# Patient Record
Sex: Male | Born: 1940
Health system: Southern US, Community
[De-identification: ages and names within clinical notes are randomized; demographics above are authoritative.]

## PROBLEM LIST (undated history)

## (undated) DIAGNOSIS — K922 Gastrointestinal hemorrhage, unspecified: Secondary | ICD-10-CM

## (undated) DIAGNOSIS — I509 Heart failure, unspecified: Secondary | ICD-10-CM

## (undated) DIAGNOSIS — E119 Type 2 diabetes mellitus without complications: Secondary | ICD-10-CM

## (undated) DIAGNOSIS — I2510 Atherosclerotic heart disease of native coronary artery without angina pectoris: Secondary | ICD-10-CM

## (undated) DIAGNOSIS — Z8719 Personal history of other diseases of the digestive system: Secondary | ICD-10-CM

## (undated) DIAGNOSIS — I219 Acute myocardial infarction, unspecified: Secondary | ICD-10-CM

## (undated) DIAGNOSIS — K08109 Complete loss of teeth, unspecified cause, unspecified class: Secondary | ICD-10-CM

## (undated) DIAGNOSIS — D649 Anemia, unspecified: Secondary | ICD-10-CM

## (undated) DIAGNOSIS — I10 Essential (primary) hypertension: Secondary | ICD-10-CM

## (undated) DIAGNOSIS — Z972 Presence of dental prosthetic device (complete) (partial): Secondary | ICD-10-CM

## (undated) HISTORY — PX: RECTAL SURGERY: SHX760

## (undated) HISTORY — PX: HEMORROIDECTOMY: SUR656

## (undated) HISTORY — PX: EYE SURGERY: SHX253

## (undated) HISTORY — PX: FOOT SURGERY: SHX648

## (undated) HISTORY — PX: OTHER SURGICAL HISTORY: SHX169

## (undated) HISTORY — PX: SHOULDER SURGERY: SHX246

## (undated) HISTORY — PX: CORONARY ANGIOPLASTY: SHX604

---
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
## 2002-04-05 DIAGNOSIS — I219 Acute myocardial infarction, unspecified: Secondary | ICD-10-CM

## 2002-04-05 HISTORY — DX: Acute myocardial infarction, unspecified: I21.9

## 2008-08-05 ENCOUNTER — Ambulatory Visit: Payer: Self-pay | Admitting: Gastroenterology

## 2008-08-05 LAB — HM COLONOSCOPY

## 2010-05-02 HISTORY — PX: CHOLECYSTECTOMY: SHX55

## 2011-04-22 ENCOUNTER — Emergency Department: Payer: Self-pay | Admitting: Emergency Medicine

## 2011-06-13 ENCOUNTER — Ambulatory Visit: Payer: Self-pay | Admitting: Surgery

## 2011-06-13 LAB — BASIC METABOLIC PANEL
BUN: 19 mg/dL — ABNORMAL HIGH (ref 7–18)
Calcium, Total: 9.5 mg/dL (ref 8.5–10.1)
Chloride: 101 mmol/L (ref 98–107)
EGFR (African American): >60
EGFR (Non-African Amer.): >60
Glucose: 106 mg/dL — ABNORMAL HIGH (ref 65–99)
Osmolality: 280 (ref 275–301)
Potassium: 3.6 mmol/L (ref 3.5–5.1)
Sodium: 139 mmol/L (ref 136–145)

## 2011-06-16 ENCOUNTER — Ambulatory Visit: Payer: Self-pay | Admitting: Surgery

## 2013-01-28 ENCOUNTER — Ambulatory Visit: Payer: Self-pay | Admitting: Gastroenterology

## 2013-03-12 ENCOUNTER — Ambulatory Visit: Payer: Self-pay | Admitting: Surgery

## 2013-03-12 LAB — CBC WITH DIFFERENTIAL/PLATELET
Basophil #: 0 10*3/uL (ref 0.0–0.1)
Eosinophil %: 5.6 %
HCT: 35.5 % — ABNORMAL LOW (ref 40.0–52.0)
Lymphocyte #: 1.1 10*3/uL (ref 1.0–3.6)
MCH: 23.6 pg — ABNORMAL LOW (ref 26.0–34.0)
MCV: 74 fL — ABNORMAL LOW (ref 80–100)
Monocyte #: 0.5 x10 3/mm (ref 0.2–1.0)
Neutrophil #: 3.5 10*3/uL (ref 1.4–6.5)
Neutrophil %: 63.9 %
Platelet: 234 10*3/uL (ref 150–440)
RBC: 4.8 10*6/uL (ref 4.40–5.90)
RDW: 19 % — ABNORMAL HIGH (ref 11.5–14.5)

## 2013-03-12 LAB — BASIC METABOLIC PANEL
Anion Gap: 2 — ABNORMAL LOW (ref 7–16)
Calcium, Total: 9.5 mg/dL (ref 8.5–10.1)
Chloride: 104 mmol/L (ref 98–107)
Co2: 30 mmol/L (ref 21–32)
Creatinine: 1.06 mg/dL (ref 0.60–1.30)
EGFR (Non-African Amer.): >60
Glucose: 171 mg/dL — ABNORMAL HIGH (ref 65–99)
Sodium: 136 mmol/L (ref 136–145)

## 2013-03-19 ENCOUNTER — Ambulatory Visit: Payer: Self-pay | Admitting: Surgery

## 2013-03-20 LAB — PATHOLOGY REPORT

## 2013-03-27 ENCOUNTER — Observation Stay: Payer: Self-pay | Admitting: Surgery

## 2013-03-27 LAB — COMPREHENSIVE METABOLIC PANEL
Albumin: 3.7 g/dL (ref 3.4–5.0)
Alkaline Phosphatase: 95 U/L
Anion Gap: 9 (ref 7–16)
BUN: 17 mg/dL (ref 7–18)
Chloride: 100 mmol/L (ref 98–107)
EGFR (African American): >60
EGFR (Non-African Amer.): 57 — ABNORMAL LOW
Glucose: 186 mg/dL — ABNORMAL HIGH (ref 65–99)
Osmolality: 275 (ref 275–301)
Potassium: 3.5 mmol/L (ref 3.5–5.1)
SGOT(AST): 14 U/L — ABNORMAL LOW (ref 15–37)
SGPT (ALT): 30 U/L (ref 12–78)
Total Protein: 7.5 g/dL (ref 6.4–8.2)

## 2013-03-27 LAB — CBC
HGB: 11.3 g/dL — ABNORMAL LOW (ref 13.0–18.0)
MCV: 74 fL — ABNORMAL LOW (ref 80–100)
Platelet: 296 10*3/uL (ref 150–440)
RDW: 18.7 % — ABNORMAL HIGH (ref 11.5–14.5)
WBC: 8.6 10*3/uL (ref 3.8–10.6)

## 2013-05-02 HISTORY — PX: SHOULDER SURGERY: SHX246

## 2013-05-06 ENCOUNTER — Ambulatory Visit: Payer: Self-pay | Admitting: Specialist

## 2013-05-16 ENCOUNTER — Ambulatory Visit: Payer: Self-pay | Admitting: Specialist

## 2013-05-16 LAB — POTASSIUM: Potassium: 3.6 mmol/L (ref 3.5–5.1)

## 2013-05-24 ENCOUNTER — Ambulatory Visit: Payer: Self-pay | Admitting: Specialist

## 2013-11-14 LAB — PSA: PSA: 2.7

## 2014-05-02 HISTORY — PX: BACK SURGERY: SHX140

## 2014-06-12 LAB — BASIC METABOLIC PANEL
BUN: 16 mg/dL (ref 4–21)
Creatinine: 1 mg/dL (ref 0.6–1.3)
Glucose: 114 mg/dL
POTASSIUM: 4.3 mmol/L (ref 3.4–5.3)
Sodium: 140 mmol/L (ref 137–147)

## 2014-06-12 LAB — LIPID PANEL
Cholesterol: 171 mg/dL (ref 0–200)
HDL: 57 mg/dL (ref 35–70)
LDL Cholesterol: 70 mg/dL
LDL/HDL RATIO: 1.2
TRIGLYCERIDES: 220 mg/dL — AB (ref 40–160)

## 2014-06-12 LAB — HEPATIC FUNCTION PANEL
ALT: 18 U/L (ref 10–40)
AST: 21 U/L (ref 14–40)
Alkaline Phosphatase: 55 U/L (ref 25–125)
Bilirubin, Total: 0.6 mg/dL

## 2014-06-12 LAB — TSH: TSH: 1.29 u[IU]/mL (ref 0.41–5.90)

## 2014-08-22 NOTE — H&P (Signed)
Subjective/Chief Complaint Significant rectal bleeding   History of Present Illness 74 y/o male now POD 8 s/p PPH stapled hemorroidectomy for symptomatic grade 3 hemorroids.  Uneventful post op course until at 630 pm tonight when had sudden onset of bright red blood per rectum during BM.  He experienced a small amount of pain in rectum which has since resolved.  He has continued to bleed since arrival in ER.  Surgery was asked to evaluate.  takes plavix and ASA.   Past History CAD h/o MI with stents. Hypertension Type 2 DM hemorroids diverticulosis   Primary Physician [facility].   Past Med/Surgical Hx:  HTN:   Diabetes:   MI - Myocardial Infarct: x3  Hemorrhoidectomy:   Ankle Surgery - Right:   Carotid Stent Placement:   ALLERGIES:  PCN: Hives  HOME MEDICATIONS: Medication Instructions Status  aspirin 81 mg oral tablet 1 tab(s) orally once a day HS Active  hydrochlorothiazide-lisinopril 12.5 mg-20 mg oral tablet 1 tab(s) orally once a day AM Active  Lasix 40 mg oral tablet 1 tab(s) orally once a day AM Active  multi-vitamin 1 tablet dailyAM Active  Fish Oil 1000 mg oral capsule takes 1 cap two x daily Active  Actos 45 mg oral tablet 1 tab(s) orally once a  day AM Active  mag oxide   takes 443m 2 x daily Active  Norvasc 5 mg oral tablet 1 tab(s) orally once a day Active  traMADol 50 mg oral tablet 1 tab(s) orally every 4 hours, As Needed Active  Vitamin D3 1000 intl units oral tablet 1 tab(s) orally once a day Active  meclizine 25 mg oral tablet 1 tab(s) orally 3 times a day, As Needed Active  pantoprazole 40 mg oral delayed release tablet 1 tab(s) orally once a day Active  ferrous sulfate  orally 3240mActive  acetaminophen-HYDROcodone 325 mg-5 mg oral tablet  orally 1-2  every 4 hours as needed for pain Active  docusate sodium 200 milligram(s) orally 2 times a day Active  metformin 1000 mg oral tablet 1 tab(s) orally 2 times a day Active  lovastatin 40 mg oral  tablet 1 tab(s) orally at bedtime Active  metoprolol tartrate 50 mg oral tablet 1 tab(s) orally 2 times a day Active  glucosamine 15002mO   1 tablet twice a day Active   Review of Systems:  Subjective/Chief Complaint see above   Fever/Chills No   Cough No   Abdominal Pain No   Tolerating Diet Yes   Medications/Allergies Reviewed Medications/Allergies reviewed   Physical Exam:  GEN no acute distress, obese   HEENT pale conjunctivae, PERRL   NECK supple   RESP normal resp effort  clear BS   CARD regular rate   ABD denies tenderness  large amount of clotted blood on perineum, DRE deferred.   LYMPH negative neck   EXTR negative cyanosis/clubbing   SKIN normal to palpation   NEURO cranial nerves intact   PSYCH A+O to time, place, person, good insight   Lab Results: Hepatic:  26-Nov-14 19:46   Bilirubin, Total 0.3  Alkaline Phosphatase 95 (45-117 NOTE: New Reference Range 03/22/13)  SGPT (ALT) 30  SGOT (AST)  14  Total Protein, Serum 7.5  Albumin, Serum 3.7  Routine BB:  26-Nov-14 19:46   ABO Group + Rh Type A Positive  Antibody Screen NEGATIVE (Result(s) reported on 27 Mar 2013 at 08:55PM.)  Routine Chem:  26-Nov-14 19:46   Glucose, Serum  186  BUN 17  Creatinine (comp) 1.26  Sodium, Serum  134  Potassium, Serum 3.5  Chloride, Serum 100  CO2, Serum 25  Calcium (Total), Serum 9.4  Osmolality (calc) 275  eGFR (African American) >60  eGFR (Non-African American)  57 (eGFR values <65m/min/1.73 m2 may be an indication of chronic kidney disease (CKD). Calculated eGFR is useful in patients with stable renal function. The eGFR calculation will not be reliable in acutely ill patients when serum creatinine is changing rapidly. It is not useful in  patients on dialysis. The eGFR calculation may not be applicable to patients at the low and high extremes of body sizes, pregnant women, and vegetarians.)  Anion Gap 9  Routine Hem:  26-Nov-14 19:46   WBC  (CBC) 8.6  RBC (CBC) 4.72  Hemoglobin (CBC)  11.3  Hematocrit (CBC)  34.9  Platelet Count (CBC) 296 (Result(s) reported on 27 Mar 2013 at 08:06PM.)  MCV  74  MCH  23.9  MCHC 32.3  RDW  18.7    Assessment/Admission Diagnosis 74y/o male with post op bleeding following PPH hemorroidectomy.   Plan Admit,  NPO exam under anesthesia. discussed in detail with him and wife.   Electronic Signatures: BSherri Rad(MD)  (Signed [id number])  Authored: CHIEF COMPLAINT and HISTORY, PAST MEDICAL/SURGIAL HISTORY, ALLERGIES, HOME MEDICATIONS, REVIEW OF SYSTEMS, PHYSICAL EXAM, LABS, ASSESSMENT AND PLAN   Last Updated: 26-Nov-14 21:45 by BSherri Rad(MD)

## 2014-08-22 NOTE — Op Note (Signed)
PATIENT NAME:  Todd Olson, Todd Olson MR#:  [id number] DATE OF BIRTH:  01/21/1941  DATE OF PROCEDURE:  03/19/2013  PREOPERATIVE DIAGNOSIS: Hemorrhoids.   POSTOPERATIVE DIAGNOSIS: Hemorrhoids.     OPERATION: Hemorrhoidectomy.   SURGEON: Micheline Maze, MD    ASSISTANT:  Imogene Burn, PA student.   ANESTHESIA: General.   OPERATIVE PROCEDURE: With the patient in the supine position after appropriate general anesthesia he was placed in lithotomy position, appropriately padded and positioned. He did have significant prolapsing hemorrhoids, graded at a stage 3. A bivalve retractor was used to evaluate the rectum. There was 1 large cluster in the 8 o'clock position and multiple smaller clusters. I made the decision to pursue a [place] stapled hemorrhoidectomy as I was concerned about the possibility of leaving significant hemorrhoids behind with a conventional procedure. Using the obturator the rectum was evaluated.  A pursestring suture was placed 2 cm above the highest hemorrhoid in a circular fashion. A 2-0 Prolene suture was utilized for that pursestring. The pursestring appeared to be satisfactory. The stapling device was brought to the table, greased and inserted into the staple line. Staples line was secured around the anvil in a circumferential fashion. The suture was then tied and the strands brought through the body of the stapler to hold pressure on the closure. The stapler was approximated and after appropriate interval 2 minutes, fired. The patient did have a little bucking at that time and I am concerned it may have contributed to the eventual result.  The stapler was fired and opened. A complete ring was not identified. The largest hemorrhoid cluster had not been incorporated and I believe was pushed out of the stapling device during the patient's bucking against the ventilator. I elected to remove that hemorrhoid cluster individually. The LigaSure apparatus was brought to the table and the hemorrhoid  removed in the standard fashion. It was oversewn with a 2-0 Vicryl, leaving a drain site at the anoderm. Bleeding appeared to be satisfactorily controlled. The area was irrigated. A plug of Avitene and Gelfoam was inserted into the rectum and a sterile dressing applied. The patient was returned to the recovery room, having tolerated the procedure well.  Sponge and needle count were correct x 2 in the Operating Room.    ____________________________ Micheline Maze, MD rle:cs D: 03/19/2013 13:45:51 ET T: 03/19/2013 14:03:26 ET JOB#: [id number]  cc: Rodena Goldmann III, MD, <Dictator> Richard L. Rosanna Randy, MD Rodena Goldmann MD ELECTRONICALLY SIGNED 03/21/2013 19:04

## 2014-08-22 NOTE — Op Note (Signed)
PATIENT NAME:  Todd Olson, Todd Olson MR#:  [id number] DATE OF BIRTH:  Feb 07, 1941  DATE OF PROCEDURE:  03/28/2013  PREOPERATIVE DIAGNOSIS: Postoperative hemorrhage status post stapled hemorrhoidectomy.   POSTOPERATIVE DIAGNOSIS: Postoperative hemorrhage status post stapled hemorrhoidectomy.  PROCEDURES PERFORMED: Examination under anesthesia, cauterization of bleeding operative site, placement of rectal packing.   SURGEON: Sherri Rad, M.D. FACS  ASSISTANT: Scrub tech  ANESTHESIA: General endotracheal and 30 mL of 0.25% Marcaine with epinephrine.   DESCRIPTION OF PROCEDURE: With informed consent, supine position, general endotracheal anesthesia, the patient was then positioned in dorsal lithotomy with padding. The perineum was sterilely prepped and draped with Betadine solution. Timeout was observed.   Operative findings, under anesthesia, demonstrated an area of recent external hemorroidectomy site at the 8:00 position with the patient in dorsal lithotomy. There was evidence of active granulation tissue formation. Digital rectal examination demonstrated an intact PPH staple line in a circular fashion. The bivalved  small retractor was then placed. A small amount of clot was extruded. The anal canal was irrigated. Along the 8:00 position was some suture material with adherent clot. This was not disrupted. Distal to this, was a small area of visible bleeding from the granulation tissue site which was cauterized. No active bleeding was encountered. Anal canal was packed with Avitene and Gelfoam. A postoperative field block consisting of 30 mL of 0.25% Marcaine with epinephrine was infiltrated around the perianal skin. The patient was then subsequently extubated and taken to the recovery room in stable and satisfactory condition by anesthesia services.    ____________________________ Todd How Marina Gravel, MD FACS mab:NTS D: 03/28/2013 02:46:39 ET T: 03/28/2013 03:22:19 ET JOB#: [id number]  cc: Todd [place] A. Marina Gravel, MD,  <Dictator> Todd Fiorillo A Sophi Calligan MD ELECTRONICALLY SIGNED 03/28/2013 20:21

## 2014-08-23 NOTE — Op Note (Signed)
PATIENT NAME:  Todd Olson, Todd Olson MR#:  [id number] DATE OF BIRTH:  1941-04-06  DATE OF PROCEDURE:  05/24/2013  PREOPERATIVE DIAGNOSIS: Severe impingement syndrome, left shoulder.   POSTOPERATIVE DIAGNOSIS: Severe impingement syndrome, left shoulder.   PROCEDURES: 1.  Arthroscopic partial anterior acromioplasty, left shoulder.  2.  Arthroscopic partial distal claviculectomy left shoulder.   SURGEON: Christophe Louis, M.D.   ANESTHESIA: General.   COMPLICATIONS: None.   ESTIMATED BLOOD LOSS: 50 mL.   DESCRIPTION OF PROCEDURE:  Ancef 1 gram was given intravenously prior to administration of general anesthesia. The patient is turned over into the right lateral decubitus position and secured in the bean bag for left shoulder surgery. The left shoulder and arm were thoroughly prepped with alcohol and ChloraPrep and draped in standard sterile fashion. The arm is suspended with 12 pounds longitudinal traction and about 20 degrees of abduction and 20 degrees of forward flexion. The subacromial bursa is entered from a posterior portal. The outflow is anterior. There is seen to be in the bursa, a moderate amount of hypertrophic inflammation tissue present. From a lateral portal using the full radial resector and the ArthroWand, the hypertrophic tissue is fully excised along with the soft tissue on the inferior surface of the acromion. There is seen to be an extremely large anterior hypertrophic osteophyte coming off of the acromion and also a smaller osteophyte coming off of the distal inferior edge of the clavicle. Using a combination of the acromionizer, and the full radial resector, anterior acromionectomy was performed back to an anatomical position. Similarly, the distal clavicle was excised on the inferior portion where the osteophyte lay. The rotator cuff is thoroughly inspected and was seen to be intact with a moderate amount of fraying. The wound is thoroughly irrigated multiple times as well as the  bursa. Portals are closed with 5-0 nylon. The joint is infiltrated with 20 mL of Marcaine with epinephrine with 4 mg of morphine and 1 mL of Depo-Medrol. A soft bulky dressing is applied along with a sling, and the patient is returned to the recovery room in satisfactory condition having tolerated the procedure quite well.   ____________________________ Lucas Mallow, MD ces:dp D: 05/24/2013 11:21:47 ET T: 05/24/2013 11:31:05 ET JOB#: [id number]  cc: Lucas Mallow, MD, <Dictator> Lucas Mallow MD ELECTRONICALLY SIGNED 05/29/2013 8:42

## 2014-08-23 NOTE — Op Note (Signed)
PATIENT NAME:  Todd Olson, Todd Olson MR#:  [id number] DATE OF BIRTH:  Mar 14, 1941  DATE OF PROCEDURE:  05/24/2013  PREOPERATIVE DIAGNOSIS:  Severe impingement syndrome, left shoulder.   POSTOPERATIVE DIAGNOSIS:  Severe impingement syndrome, left shoulder.   PROCEDURES: 1.  Arthroscopic anterior acromioplasty, left shoulder.  2.  Arthroscopic partial distal claviculectomy left shoulder.   SURGEON: Christophe Louis, M.D.   ANESTHESIA: General.   COMPLICATIONS: None.   ESTIMATED BLOOD LOSS: 25 mL.   DESCRIPTION OF PROCEDURE:  Ancef 1 gram was given intravenously prior to the procedure. General anesthesia is induced. The patient is then turned over into the right lateral decubitus position and secured with the bean bag. The left upper extremity is then thoroughly prepped with alcohol and ChloraPrep and draped in standard sterile fashion. The arm is suspended with traction in slight flexion and about 30 degrees abduction with 12 pounds longitudinal traction. The subacromial bursa at the left shoulder is then entered and then the posterior portal is used for the camera and the anterior portal for outflow. Lateral portal was used for instrumentation. There is seen to be a moderate amount of hypertrophic bursal tissue present. The full radial resector and the ArthroWand are used to resect all the hypertrophic tissue and to remove the soft tissue from the posterior aspect of the anterior acromial spur. There is seen to be a very large anterior hypertrophic acromial spur along with a spur coming inferiorly off the end of the distal clavicle. Using the acromionizer, the anterior acromial spur is fully resected back to a normal anatomy. Probe is used to demonstrate that no residual spur was present. Similarly, the soft tissue is removed from the distal inferior portion of the clavicle and the acromionizer is used to remove the inferior spur. The joint is thoroughly irrigated multiple times. The portals are closed  with 5-0 nylon. The bursa is injected with 20 mL of Marcaine with epinephrine, 4 mg of morphine and 1 mL Depo-Medrol. A soft bulky dressing is applied along with a sling. The patient is returned to the recovery room in satisfactory condition having tolerated the procedure quite well.   ____________________________ Lucas Mallow, MD ces:dp D: 05/24/2013 09:46:48 ET T: 05/24/2013 10:23:35 ET JOB#: [id number]  cc: Lucas Mallow, MD, <Dictator> Lucas Mallow MD ELECTRONICALLY SIGNED 05/29/2013 8:42

## 2014-08-24 NOTE — Op Note (Signed)
PATIENT NAME:  Todd Olson, Todd Olson MR#:  [id number] DATE OF BIRTH:  October 06, 1940  DATE OF PROCEDURE:  06/16/2011  PREOPERATIVE DIAGNOSIS: Chronic cholecystitis, cholelithiasis.   POSTOPERATIVE DIAGNOSIS: Chronic cholecystitis, cholelithiasis.   PROCEDURE: Laparoscopic cholecystectomy, cholangiogram.   SURGEON: Rochel Brome, MD  ASSISTANT: Britta Mccreedy, PA   ANESTHESIA: General.   INDICATIONS: This 74 year old male has a history of epigastric pains and findings of gallstones and surgery was recommended for definitive treatment.   DESCRIPTION OF PROCEDURE: The patient was placed on the operating table in the supine position under general endotracheal anesthesia. The abdomen was prepared with ChloraPrep and draped in a sterile manner.   A short incision was made in the inferior aspect of the umbilicus and carried down to the deep fascia which was grasped with laryngeal hook and elevated. A Veress needle was inserted, aspirated, and irrigated with a saline solution. Next, the peritoneal cavity was inflated with carbon dioxide. The Veress needle was removed. The 10 mm cannula was inserted. The 10 mm, 0 degree laparoscope was inserted to view the peritoneal cavity. Another incision was made in the epigastrium just to the right of the midline to introduce an 11 mm cannula. Next, two incisions were made in the lateral aspect of the right upper quadrant to introduce two 5 mm cannulas.   The patient was placed in the reverse Trendelenburg position and turned 5 degrees to the left. The gallbladder was retracted towards the right shoulder. Multiple adhesions were taken down with blunt and sharp dissection. A number of adhesions were dissected sharply off the liver. Several small bleeding points were cauterized. Further dissection of adhesions was carried down to the infundibulum which was retracted inferiorly and laterally. The porta hepatis was demonstrated. There was a large amount of fatty tissue which was  dissected. The cystic artery was seen coursing along the anterior aspect of the gallbladder. It was dissected free from surrounding structures. The cystic duct was also dissected free from surrounding structures and appeared to be about 5 to 6 mm in dimension. Its junction with the porta hepatis was demonstrated. The gallbladder was further dissected free from the liver mobilizing the infundibulum and demonstrating a critical view of safety. The cystic artery was controlled with double endoclips and divided. The cystic duct was ligated with an endoclip adjacent to the infundibulum. Next, an incision was made in the cystic duct to introduce a Reddick catheter. Half-strength Conray-60 dye was injected as the cholangiogram was done with fluoroscopy demonstrating the biliary tree with typical cystic duct and prompt flow of dye into the duodenum. No retained stones were seen. The Reddick catheter was removed. The cystic duct was doubly ligated with endoclips and divided. The gallbladder was dissected free from the liver with hook and cautery. The site was irrigated with heparinized saline solution and aspirated. The gallbladder was delivered up through the infraumbilical incision, opened, and suctioned. Multiple stones were removed, and the gallbladder was removed along with stones and submitted in formalin for routine pathology. There were several particles of stones which fell onto the omentum and these were retrieved with a 5 mm grasper. The site was irrigated with heparinized saline solution and aspirated. The right upper quadrant was further inspected, irrigated, and aspirated. Hemostasis was intact.   All cannulas were removed. Carbon dioxide was allowed to escape from the peritoneal cavity. Skin incisions were closed with interrupted 5-0 chromic subcuticular sutures, benzoin, and Steri-Strips. Dressings were applied with paper tape. The patient tolerated surgery satisfactorily and is now  being prepared for  transfer to the Recovery Room.  ____________________________ Lenna Sciara. Rochel Brome, MD jws:slb D: 06/16/2011 10:23:20 ET T: 06/16/2011 10:33:11 ET JOB#: [id number]  cc: Loreli Dollar, MD, <Dictator> Loreli Dollar MD ELECTRONICALLY SIGNED 06/18/2011 12:35

## 2014-09-10 ENCOUNTER — Encounter: Payer: Self-pay | Admitting: Emergency Medicine

## 2014-09-10 DIAGNOSIS — D649 Anemia, unspecified: Secondary | ICD-10-CM | POA: Insufficient documentation

## 2014-09-10 DIAGNOSIS — I10 Essential (primary) hypertension: Secondary | ICD-10-CM | POA: Insufficient documentation

## 2014-09-10 DIAGNOSIS — D179 Benign lipomatous neoplasm, unspecified: Secondary | ICD-10-CM | POA: Insufficient documentation

## 2014-09-10 DIAGNOSIS — R42 Dizziness and giddiness: Secondary | ICD-10-CM | POA: Insufficient documentation

## 2014-09-10 DIAGNOSIS — M19049 Primary osteoarthritis, unspecified hand: Secondary | ICD-10-CM | POA: Insufficient documentation

## 2014-09-10 DIAGNOSIS — M75100 Unspecified rotator cuff tear or rupture of unspecified shoulder, not specified as traumatic: Secondary | ICD-10-CM | POA: Insufficient documentation

## 2014-09-10 DIAGNOSIS — M549 Dorsalgia, unspecified: Secondary | ICD-10-CM

## 2014-09-10 DIAGNOSIS — G723 Periodic paralysis: Secondary | ICD-10-CM | POA: Insufficient documentation

## 2014-09-10 DIAGNOSIS — K649 Unspecified hemorrhoids: Secondary | ICD-10-CM | POA: Insufficient documentation

## 2014-09-10 DIAGNOSIS — I25810 Atherosclerosis of coronary artery bypass graft(s) without angina pectoris: Secondary | ICD-10-CM | POA: Insufficient documentation

## 2014-09-10 DIAGNOSIS — E119 Type 2 diabetes mellitus without complications: Secondary | ICD-10-CM | POA: Insufficient documentation

## 2014-09-10 DIAGNOSIS — M791 Myalgia, unspecified site: Secondary | ICD-10-CM | POA: Insufficient documentation

## 2014-09-10 DIAGNOSIS — G8929 Other chronic pain: Secondary | ICD-10-CM | POA: Insufficient documentation

## 2014-09-10 DIAGNOSIS — E785 Hyperlipidemia, unspecified: Secondary | ICD-10-CM | POA: Insufficient documentation

## 2014-09-10 DIAGNOSIS — D126 Benign neoplasm of colon, unspecified: Secondary | ICD-10-CM | POA: Insufficient documentation

## 2014-09-10 LAB — CBC AND DIFFERENTIAL
HCT: 44 % (ref 41–53)
HEMOGLOBIN: 14.7 g/dL (ref 13.5–17.5)
NEUTROS ABS: 4 /uL
PLATELETS: 208 10*3/uL (ref 150–399)
WBC: 6 10^3/mL

## 2014-09-10 LAB — HEMOGLOBIN A1C: Hgb A1c MFr Bld: 6.1 % — AB (ref 4.0–6.0)

## 2014-09-11 ENCOUNTER — Encounter: Payer: Self-pay | Admitting: Emergency Medicine

## 2014-10-22 ENCOUNTER — Other Ambulatory Visit: Payer: Self-pay

## 2014-10-22 MED ORDER — METOPROLOL TARTRATE 50 MG PO TABS: 50.0000 mg | ORAL_TABLET | Freq: Two times a day (BID) | ORAL | Status: DC

## 2014-11-07 ENCOUNTER — Other Ambulatory Visit: Payer: Self-pay

## 2014-11-07 ENCOUNTER — Telehealth: Payer: Self-pay | Admitting: Family Medicine

## 2014-11-07 MED ORDER — CLOPIDOGREL BISULFATE 75 MG PO TABS: 75.0000 mg | ORAL_TABLET | Freq: Every day | ORAL | Status: DC

## 2014-11-07 NOTE — Telephone Encounter (Signed)
Pt contacted office for refill request on the following medications: pioglitazone (ACTOS) 45 MG tablet.  [place]  CB#[phone]/[phone]

## 2014-11-10 ENCOUNTER — Other Ambulatory Visit: Payer: Self-pay

## 2014-11-10 MED ORDER — PIOGLITAZONE HCL 45 MG PO TABS: 45.0000 mg | ORAL_TABLET | Freq: Every day | ORAL | Status: DC

## 2015-01-26 ENCOUNTER — Ambulatory Visit (INDEPENDENT_AMBULATORY_CARE_PROVIDER_SITE_OTHER): Payer: Medicare PPO | Admitting: Family Medicine

## 2015-01-26 ENCOUNTER — Encounter: Payer: Self-pay | Admitting: Family Medicine

## 2015-01-26 VITALS — BP 122/68 | HR 68 | Temp 97.9°F | Resp 16 | Ht 68.0 in | Wt 209.0 lb

## 2015-01-26 DIAGNOSIS — E119 Type 2 diabetes mellitus without complications: Secondary | ICD-10-CM

## 2015-01-26 DIAGNOSIS — Z23 Encounter for immunization: Secondary | ICD-10-CM | POA: Diagnosis not present

## 2015-01-26 LAB — POCT GLYCOSYLATED HEMOGLOBIN (HGB A1C): HEMOGLOBIN A1C: 6.4

## 2015-01-26 NOTE — Progress Notes (Signed)
Patient ID: Todd Olson, male   DOB: [birth year], 74 y.o.   MRN: [id number]       Patient: Todd Olson Male    DOB: 03/15/1941   73 y.o.   MRN: [id number] Visit Date: 01/26/2015  Today's Provider: [name], MD   Chief Complaint  Patient presents with  . Diabetes    5 month F/U.  Marland Kitchen Hypertension    5 month F/U.    Subjective:    Diabetes He presents for his follow-up diabetic visit. He has type 2 diabetes mellitus. His disease course has been stable. There are no hypoglycemic associated symptoms. There are no diabetic associated symptoms. There are no hypoglycemic complications. Symptoms are stable. There are no diabetic complications. He is compliant with treatment all of the time. He monitors blood glucose at home 1-2 x per day. Blood glucose monitoring compliance is good. There is no change in his home blood glucose trend. He does not see a podiatrist.Eye exam is current.  Hypertension This is a chronic problem. The problem is unchanged. The problem is controlled. There are no compliance problems.   Patient reports that he checks his BP every morning. He reports that his BP this morning was 121/68.      Allergies  Allergen Reactions  . Penicillins    Previous Medications   AMLODIPINE (NORVASC) 5 MG TABLET    Take by mouth.   ASPIRIN 81 MG TABLET    Take by mouth.   CHOLECALCIFEROL 1000 UNITS CAPSULE    Take by mouth.   CLOPIDOGREL (PLAVIX) 75 MG TABLET    Take 1 tablet (75 mg total) by mouth daily.   FUROSEMIDE (LASIX) 40 MG TABLET    Take by mouth.   GLUCOSAMINE-CHONDROIT-VIT C-MN (GLUCOSAMINE CHONDR 1500 COMPLX) CAPS    Take by mouth.   GLUCOSE BLOOD TEST STRIP    ONETOUCH ULTRA BLUE (In Vitro Strip)  1 Strip two times daily and as needed for 0 days  Quantity: 100;  Refills: 11   Ordered :20-May-2014  Miguel Aschoff MD;  Started 20-May-2014 Active Comments: Medication taken as needed. E11.9   IRON, IRON,    Take by mouth.   LISINOPRIL-HYDROCHLOROTHIAZIDE  (PRINZIDE,ZESTORETIC) 20-12.5 MG PER TABLET    Take by mouth.   LOVASTATIN (MEVACOR) 40 MG TABLET    Take by mouth.   MAGNESIUM OXIDE (MAG-OX) 400 (241.3 MG) MG TABLET    Take by mouth.   METFORMIN (GLUCOPHAGE) 1000 MG TABLET    Take by mouth.   METOPROLOL (LOPRESSOR) 50 MG TABLET    Take 1 tablet (50 mg total) by mouth 2 (two) times daily.   MULTIPLE VITAMIN PO    Take by mouth.   OMEGA-3 FATTY ACIDS PO    Take by mouth.   PIOGLITAZONE (ACTOS) 45 MG TABLET    Take 1 tablet (45 mg total) by mouth daily.    Review of Systems  Constitutional: Negative.   Eyes: Negative.   Respiratory: Negative.   Cardiovascular: Negative.   Endocrine: Negative.   Musculoskeletal: Negative.   Allergic/Immunologic: Negative.   Neurological: Negative.   Psychiatric/Behavioral: Negative.     Social History  Substance Use Topics  . Smoking status: Never Smoker   . Smokeless tobacco: Not on file  . Alcohol Use: No   Objective:   BP 122/68 mmHg  Pulse 68  Temp(Src) 97.9 F (36.6 C)  Resp 16  Ht 5\' 8"  (1.727 m)  Wt 209 lb (94.802 kg)  BMI 31.79  kg/m2  Physical Exam  Constitutional: He is oriented to person, place, and time. He appears well-developed and well-nourished.  HENT:  Head: Normocephalic and atraumatic.  Right Ear: External ear normal.  Left Ear: External ear normal.  Nose: Nose normal.  Eyes: Conjunctivae are normal.  Neck: Neck supple.  Cardiovascular: Normal rate, regular rhythm and normal heart sounds.   Pulmonary/Chest: Effort normal and breath sounds normal.  Abdominal: Soft.  Neurological: He is alert and oriented to person, place, and time.  Skin: Skin is warm and dry.  Psychiatric: He has a normal mood and affect. His behavior is normal. Judgment and thought content normal.        Assessment & Plan:     1. Type 2 diabetes mellitus without complication  - POCT HgB A1C--6.4 today.  2. Need for influenza vaccination  - Flu vaccine HIGH DOSE PF (Fluzone High  dose) 3. Hypertension    I have done the exam and reviewed the above chart and it is accurate to the best of my knowledge.   Richard Cranford Mon, MD  [facility]

## 2015-02-04 ENCOUNTER — Other Ambulatory Visit: Payer: Self-pay

## 2015-02-04 MED ORDER — LOVASTATIN 40 MG PO TABS: 40.0000 mg | ORAL_TABLET | Freq: Every day | ORAL | Status: DC

## 2015-05-28 ENCOUNTER — Ambulatory Visit (INDEPENDENT_AMBULATORY_CARE_PROVIDER_SITE_OTHER): Payer: Medicare PPO | Admitting: Family Medicine

## 2015-05-28 ENCOUNTER — Encounter: Payer: Self-pay | Admitting: Family Medicine

## 2015-05-28 VITALS — BP 132/70 | HR 60 | Temp 98.7°F | Resp 16 | Ht 67.5 in | Wt 209.0 lb

## 2015-05-28 DIAGNOSIS — H539 Unspecified visual disturbance: Secondary | ICD-10-CM

## 2015-05-28 DIAGNOSIS — Z0000 Encounter for general adult medical examination without abnormal findings: Secondary | ICD-10-CM | POA: Diagnosis not present

## 2015-05-28 NOTE — Progress Notes (Signed)
Patient ID: BHAVESH CHURCH, male   DOB: 12-24-1940, 75 y.o.   MRN: [id number] Patient: Todd Olson, Male    DOB: 07/11/1940, 75 y.o.   MRN: [id number] Visit Date: 05/28/2015  Today's Provider: [name], MD   Chief Complaint  Patient presents with  . Annual Exam   Subjective:   Todd Olson is a 75 y.o. male who presents today for his Subsequent Annual Wellness Visit. He feels well. He reports exercising none. He reports he is sleeping well.  Review of Systems  Constitutional: Negative.   HENT: Negative.   Eyes: Positive for visual disturbance.  Respiratory: Negative.   Cardiovascular: Negative.   Gastrointestinal: Negative.   Endocrine: Negative.   Genitourinary: Negative.   Musculoskeletal: Positive for arthralgias.  Skin: Negative.   Allergic/Immunologic: Negative.   Neurological: Negative.   Hematological: Bruises/bleeds easily.  Psychiatric/Behavioral: Negative.     Patient Active Problem List   Diagnosis Date Noted  . Arteriosclerosis of nonautologous coronary artery bypass graft 09/10/2014  . Back pain, chronic 09/10/2014  . Diabetes mellitus, type 2 ([place]) 09/10/2014  . Well controlled diabetes mellitus ([place]) 09/10/2014  . Hemorrhoid 09/10/2014  . HLD (hyperlipidemia) 09/10/2014  . BP (high blood pressure) 09/10/2014  . Fatty tumor 09/10/2014  . Absolute anemia 09/10/2014  . Muscle ache 09/10/2014  . Arthritis of hand, degenerative 09/10/2014  . Benign neoplasm of colon 09/10/2014  . Rotator cuff syndrome 09/10/2014  . Head revolving around 09/10/2014  . Adynamia 09/10/2014    Social History   Social History  . Marital Status: Married    Spouse Name: N/A  . Number of Children: N/A  . Years of Education: N/A   Occupational History  . Not on file.   Social History Main Topics  . Smoking status: Former Research scientist (life sciences)  . Smokeless tobacco: Former Systems developer    Quit date: 11/16/1977  . Alcohol Use: No  . Drug Use: No  . Sexual Activity: Not on file    Other Topics Concern  . Not on file   Social History Narrative    Past Surgical History  Procedure Laterality Date  . Cholecystectomy  2012    Dr. Rochel Brome    His family history includes Bipolar disorder in his sister; Diabetes in his brother and brother; Heart disease in his brother, brother, father, and mother; Hypertension in his brother and father.    Outpatient Prescriptions Prior to Visit  Medication Sig Dispense Refill  . amLODipine (NORVASC) 5 MG tablet Take by mouth.    Marland Kitchen aspirin 81 MG tablet Take by mouth.    . Cholecalciferol 1000 UNITS capsule Take by mouth.    . clopidogrel (PLAVIX) 75 MG tablet Take 1 tablet (75 mg total) by mouth daily. 30 tablet 5  . furosemide (LASIX) 40 MG tablet Take by mouth.    . Glucosamine-Chondroit-Vit C-Mn (GLUCOSAMINE CHONDR 1500 COMPLX) CAPS Take by mouth.    Marland Kitchen glucose blood test strip ONETOUCH ULTRA BLUE (In Vitro Strip)  1 Strip two times daily and as needed for 0 days  Quantity: 100;  Refills: 11   Ordered :20-May-2014  Miguel Aschoff MD;  Started 20-May-2014 Active Comments: Medication taken as needed. E11.9    . IRON, IRON, Take by mouth.    Marland Kitchen lisinopril-hydrochlorothiazide (PRINZIDE,ZESTORETIC) 20-12.5 MG per tablet Take by mouth.    . lovastatin (MEVACOR) 40 MG tablet Take 1 tablet (40 mg total) by mouth at bedtime. 30 tablet 12  . magnesium oxide (MAG-OX) 400 (241.3  MG) MG tablet Take by mouth.    . metFORMIN (GLUCOPHAGE) 1000 MG tablet Take by mouth.    . metoprolol (LOPRESSOR) 50 MG tablet Take 1 tablet (50 mg total) by mouth 2 (two) times daily. 60 tablet 5  . MULTIPLE VITAMIN PO Take by mouth.    . OMEGA-3 FATTY ACIDS PO Take by mouth.    . pioglitazone (ACTOS) 45 MG tablet Take 1 tablet (45 mg total) by mouth daily. 30 tablet 12   No facility-administered medications prior to visit.    Allergies  Allergen Reactions  . Penicillins     Patient Care Team: Jerrol Banana., MD as PCP - General (Family  Medicine)  Objective:   Vitals:  Filed Vitals:   05/28/15 0905  BP: 132/70  Pulse: 60  Temp: 98.7 F (37.1 C)  TempSrc: Oral  Resp: 16  Height: 5' 7.5" (1.715 m)  Weight: 209 lb (94.802 kg)    Physical Exam  Constitutional: He is oriented to person, place, and time. He appears well-developed and well-nourished.  HENT:  Head: Normocephalic and atraumatic.  Right Ear: External ear normal.  Left Ear: External ear normal.  Nose: Nose normal.  Mouth/Throat: Oropharynx is clear and moist.  Eyes: Conjunctivae and EOM are normal. Pupils are equal, round, and reactive to light.  Neck: Normal range of motion. Neck supple.  Cardiovascular: Normal rate, regular rhythm, normal heart sounds and intact distal pulses.   Pulmonary/Chest: Effort normal and breath sounds normal.  Abdominal: Soft. Bowel sounds are normal.  Musculoskeletal: Normal range of motion.  Neurological: He is alert and oriented to person, place, and time.  Skin: Skin is warm and dry.  Psychiatric: He has a normal mood and affect. His behavior is normal. Judgment and thought content normal.    Activities of Daily Living In your present state of health, do you have any difficulty performing the following activities: 05/28/2015  Hearing? N  Vision? Y  Difficulty concentrating or making decisions? N  Walking or climbing stairs? N  Dressing or bathing? N  Doing errands, shopping? N    Fall Risk Assessment Fall Risk  05/28/2015  Falls in the past year? No     Depression Screen PHQ 2/9 Scores 05/28/2015  PHQ - 2 Score 0    Cognitive Testing - 6-CIT    Year: 0 4 points  Month: 0 3 points  Memorize "Pia Mau, [address], [place]"  Time (within 1 hour:) 0 3 points  Count backwards from 20: 0 2 4 points  Name months of year: 0 2 4 points  Repeat Address: 0 2 4 6 8 10  points   Total Score: 1/28  Interpretation : Normal (0-7) Abnormal (8-28)    Assessment & Plan:     Annual Wellness Visit  Reviewed  patient's Family Medical History Reviewed and updated list of patient's medical providers Assessment of cognitive impairment was done Assessed patient's functional ability Established a written schedule for health screening [place] Completed and Reviewed  Exercise Activities and Dietary recommendations Goals    None      Immunization History  Administered Date(s) Administered  . Influenza, High Dose Seasonal PF 01/26/2015  . Pneumococcal Conjugate-13 11/14/2013  . Pneumococcal Polysaccharide-23 10/17/2011    Health Maintenance  Topic Date Due  . FOOT EXAM  04/02/1952  . OPHTHALMOLOGY EXAM  04/02/1952  . TETANUS/TDAP  04/02/1961  . ZOSTAVAX  04/02/2002  . HEMOGLOBIN A1C  07/26/2015  . INFLUENZA VACCINE  12/01/2015  . COLONOSCOPY  08/06/2018  . PNA vac Low Risk Adult  Completed      Discussed health benefits of physical activity, and encouraged him to engage in regular exercise appropriate for his age and condition.  Visual changes in the right eye. Dr. Dawna Part in July. Denies patient to see ophthalmology. I have done the exam and reviewed the above chart and it is accurate to the best of my knowledge.   Miguel Aschoff MD [facility] 05/28/2015 9:19 AM  ------------------------------------------------------------------------------------------------------------

## 2015-06-02 ENCOUNTER — Other Ambulatory Visit: Payer: Self-pay

## 2015-06-02 MED ORDER — AMLODIPINE BESYLATE 5 MG PO TABS: 5.0000 mg | ORAL_TABLET | Freq: Every day | ORAL | Status: DC

## 2015-06-05 ENCOUNTER — Other Ambulatory Visit: Payer: Self-pay

## 2015-06-05 MED ORDER — GLUCOSE BLOOD VI STRP: ORAL_STRIP | Status: DC

## 2015-06-24 ENCOUNTER — Other Ambulatory Visit: Payer: Self-pay

## 2015-06-24 MED ORDER — METFORMIN HCL 1000 MG PO TABS: 1000.0000 mg | ORAL_TABLET | Freq: Two times a day (BID) | ORAL | Status: DC

## 2015-07-10 ENCOUNTER — Telehealth: Payer: Self-pay

## 2015-07-10 ENCOUNTER — Other Ambulatory Visit: Payer: Self-pay | Admitting: Family Medicine

## 2015-07-10 DIAGNOSIS — I10 Essential (primary) hypertension: Secondary | ICD-10-CM

## 2015-07-10 MED ORDER — FUROSEMIDE 40 MG PO TABS: 40.0000 mg | ORAL_TABLET | Freq: Every day | ORAL | Status: DC

## 2015-07-10 NOTE — Telephone Encounter (Signed)
Medication refilled

## 2015-07-10 NOTE — Telephone Encounter (Signed)
Patient emailed requesting Furosemide refilled 40 mg 1 daily. This is Dr. Alben Spittle patient. Please review-aa

## 2015-08-03 ENCOUNTER — Ambulatory Visit (INDEPENDENT_AMBULATORY_CARE_PROVIDER_SITE_OTHER): Payer: Medicare PPO | Admitting: Family Medicine

## 2015-08-03 VITALS — BP 110/52 | HR 76 | Temp 98.2°F | Resp 16 | Wt 210.0 lb

## 2015-08-03 DIAGNOSIS — E785 Hyperlipidemia, unspecified: Secondary | ICD-10-CM | POA: Diagnosis not present

## 2015-08-03 DIAGNOSIS — E119 Type 2 diabetes mellitus without complications: Secondary | ICD-10-CM | POA: Diagnosis not present

## 2015-08-03 DIAGNOSIS — I10 Essential (primary) hypertension: Secondary | ICD-10-CM | POA: Diagnosis not present

## 2015-08-03 NOTE — Progress Notes (Signed)
Patient ID: Todd Olson, male   DOB: 1940-12-16, 75 y.o.   MRN: [id number]   HILLARD YERKE  MRN: [id number] DOB: 19-Mar-1941  Subjective:  HPI  1. Type 2 diabetes mellitus without complication, without long-term current use of insulin [facility]) The patient is a 67 male who presents for follow up of her diabetes.  His last visit was on 01/26/15 and his A1C on that date was 6.4.  He states he has been checking his glucose and getting a range of 117-150.  He has had no hypoglycemic events or symptoms.  2. Essential hypertension The patient is also here for follow up on his hypertension and his readings have been within normal range.   3. Hyperlipidemia It is also time for his lipids to be checked.   Patient Active Problem List   Diagnosis Date Noted  . Arteriosclerosis of nonautologous coronary artery bypass graft 09/10/2014  . Back pain, chronic 09/10/2014  . Diabetes mellitus, type 2 ([place]) 09/10/2014  . Well controlled diabetes mellitus ([place]) 09/10/2014  . Hemorrhoid 09/10/2014  . HLD (hyperlipidemia) 09/10/2014  . BP (high blood pressure) 09/10/2014  . Fatty tumor 09/10/2014  . Absolute anemia 09/10/2014  . Muscle ache 09/10/2014  . Arthritis of hand, degenerative 09/10/2014  . Benign neoplasm of colon 09/10/2014  . Rotator cuff syndrome 09/10/2014  . Head revolving around 09/10/2014  . Adynamia 09/10/2014    No past medical history on file.  Social History   Social History  . Marital Status: Married    Spouse Name: N/A  . Number of Children: N/A  . Years of Education: N/A   Occupational History  . Not on file.   Social History Main Topics  . Smoking status: Former Research scientist (life sciences)  . Smokeless tobacco: Former Systems developer    Quit date: 11/16/1977  . Alcohol Use: No  . Drug Use: No  . Sexual Activity: Not on file   Other Topics Concern  . Not on file   Social History Narrative    Outpatient Prescriptions Prior to Visit  Medication Sig Dispense Refill  . amLODipine (NORVASC)  5 MG tablet Take 1 tablet (5 mg total) by mouth daily. 30 tablet 12  . aspirin 81 MG tablet Take by mouth.    . Cholecalciferol 1000 UNITS capsule Take by mouth.    . clopidogrel (PLAVIX) 75 MG tablet Take 1 tablet (75 mg total) by mouth daily. 30 tablet 5  . furosemide (LASIX) 40 MG tablet Take 1 tablet (40 mg total) by mouth daily. 90 tablet 0  . Glucosamine-Chondroit-Vit C-Mn (GLUCOSAMINE CHONDR 1500 COMPLX) CAPS Take by mouth.    Marland Kitchen glucose blood test strip ONETOUCH ULTRA BLUE (In Vitro Strip)  1 Strip two times daily and as needed for 0 days  Quantity: 100;  Refills: 11   Ordered :20-May-2014  Miguel Aschoff MD;  Started 20-May-2014 Active Comments: Medication taken as needed. E11.9    . glucose blood test strip Check sugar twice daily DX E11.9 100 each 12  . IRON, IRON, Take by mouth.    Marland Kitchen lisinopril-hydrochlorothiazide (PRINZIDE,ZESTORETIC) 20-12.5 MG per tablet Take by mouth.    . lovastatin (MEVACOR) 40 MG tablet Take 1 tablet (40 mg total) by mouth at bedtime. 30 tablet 12  . magnesium oxide (MAG-OX) 400 (241.3 MG) MG tablet Take by mouth.    . metFORMIN (GLUCOPHAGE) 1000 MG tablet Take 1 tablet (1,000 mg total) by mouth 2 (two) times daily with a meal. 60 tablet 12  . metoprolol (  LOPRESSOR) 50 MG tablet Take 1 tablet (50 mg total) by mouth 2 (two) times daily. 60 tablet 5  . OMEGA-3 FATTY ACIDS PO Take by mouth.    . pioglitazone (ACTOS) 45 MG tablet Take 1 tablet (45 mg total) by mouth daily. 30 tablet 12  . MULTIPLE VITAMIN PO Take by mouth.     No facility-administered medications prior to visit.    Allergies  Allergen Reactions  . Penicillins     Review of Systems  Constitutional: Negative for fever and malaise/fatigue.  Eyes: Negative.   Respiratory: Negative for cough, shortness of breath and wheezing.   Cardiovascular: Negative for chest pain, palpitations, orthopnea and leg swelling.  Neurological: Negative for dizziness, weakness and headaches.    Psychiatric/Behavioral: Negative.    Objective:  BP 110/52 mmHg  Pulse 76  Temp(Src) 98.2 F (36.8 C) (Oral)  Resp 16  Wt 210 lb (95.255 kg)  Physical Exam  Constitutional: He is oriented to person, place, and time and well-developed, well-nourished, and in no distress.  HENT:  Head: Normocephalic and atraumatic.  Right Ear: External ear normal.  Left Ear: External ear normal.  Nose: Nose normal.  Eyes: Conjunctivae are normal. Pupils are equal, round, and reactive to light.  Neck: Normal range of motion. Neck supple.  Cardiovascular: Normal rate, regular rhythm and normal heart sounds.   Pulmonary/Chest: Effort normal and breath sounds normal.  Abdominal: Soft.  Neurological: He is alert and oriented to person, place, and time. Gait normal.  Skin: Skin is warm and dry.  Psychiatric: Mood, memory, affect and judgment normal.    Assessment and Plan :   1. Type 2 diabetes mellitus without complication, without long-term current use of insulin (HCC)  - Hemoglobin A1c  2. Essential hypertension  - CBC With Differential/Platelet - TSH - Comprehensive metabolic panel  3. Hyperlipidemia  - Lipid Panel With LDL/HDL Ratio  I have done the exam and reviewed the above chart and it is accurate to the best of my knowledge.  Miguel Aschoff MD [facility] 08/03/2015 11:01 AM

## 2015-08-04 LAB — CBC WITH DIFFERENTIAL/PLATELET
BASOS ABS: 0 10*3/uL (ref 0.0–0.2)
Basos: 0 %
EOS (ABSOLUTE): 0.1 10*3/uL (ref 0.0–0.4)
Eos: 3 %
Hematocrit: 44.2 % (ref 37.5–51.0)
Hemoglobin: 14.8 g/dL (ref 12.6–17.7)
IMMATURE GRANS (ABS): 0 10*3/uL (ref 0.0–0.1)
Immature Granulocytes: 1 %
LYMPHS: 28 %
Lymphocytes Absolute: 1.4 10*3/uL (ref 0.7–3.1)
MCH: 30.3 pg (ref 26.6–33.0)
MCHC: 33.5 g/dL (ref 31.5–35.7)
MCV: 91 fL (ref 79–97)
Monocytes Absolute: 0.4 10*3/uL (ref 0.1–0.9)
Monocytes: 9 %
NEUTROS ABS: 3 10*3/uL (ref 1.4–7.0)
NEUTROS PCT: 59 %
PLATELETS: 188 10*3/uL (ref 150–379)
RBC: 4.88 x10E6/uL (ref 4.14–5.80)
RDW: 14.3 % (ref 12.3–15.4)
WBC: 5 10*3/uL (ref 3.4–10.8)

## 2015-08-04 LAB — COMPREHENSIVE METABOLIC PANEL
ALK PHOS: 51 IU/L (ref 39–117)
ALT: 25 IU/L (ref 0–44)
AST: 20 IU/L (ref 0–40)
Albumin/Globulin Ratio: 2.2 (ref 1.2–2.2)
Albumin: 4.7 g/dL (ref 3.5–4.8)
BUN/Creatinine Ratio: 16 (ref 10–24)
BUN: 16 mg/dL (ref 8–27)
Bilirubin Total: 0.7 mg/dL (ref 0.0–1.2)
CALCIUM: 9.9 mg/dL (ref 8.6–10.2)
CO2: 25 mmol/L (ref 18–29)
CREATININE: 1.03 mg/dL (ref 0.76–1.27)
Chloride: 98 mmol/L (ref 96–106)
GFR calc Af Amer: 83 mL/min/{1.73_m2} (ref >59)
GFR, EST NON AFRICAN AMERICAN: 72 mL/min/{1.73_m2} (ref >59)
GLUCOSE: 125 mg/dL — AB (ref 65–99)
Globulin, Total: 2.1 g/dL (ref 1.5–4.5)
Potassium: 4.9 mmol/L (ref 3.5–5.2)
SODIUM: 140 mmol/L (ref 134–144)
Total Protein: 6.8 g/dL (ref 6.0–8.5)

## 2015-08-04 LAB — LIPID PANEL WITH LDL/HDL RATIO
Cholesterol, Total: 169 mg/dL (ref 100–199)
HDL: 58 mg/dL (ref >39)
LDL CALC: 72 mg/dL (ref 0–99)
LDL/HDL RATIO: 1.2 ratio (ref 0.0–3.6)
Triglycerides: 195 mg/dL — ABNORMAL HIGH (ref 0–149)
VLDL Cholesterol Cal: 39 mg/dL (ref 5–40)

## 2015-08-04 LAB — HEMOGLOBIN A1C
ESTIMATED AVERAGE GLUCOSE: 137 mg/dL
HEMOGLOBIN A1C: 6.4 % — AB (ref 4.8–5.6)

## 2015-08-04 LAB — TSH: TSH: 0.901 u[IU]/mL (ref 0.450–4.500)

## 2015-08-05 ENCOUNTER — Telehealth: Payer: Self-pay

## 2015-08-05 NOTE — Telephone Encounter (Signed)
-----   Message from Jerrol Banana., MD sent at 08/05/2015 10:50 AM EDT ----- Labs okay.

## 2015-08-05 NOTE — Telephone Encounter (Signed)
Patient advised as directed below. 

## 2015-08-18 ENCOUNTER — Other Ambulatory Visit: Payer: Self-pay

## 2015-08-18 MED ORDER — LISINOPRIL-HYDROCHLOROTHIAZIDE 20-12.5 MG PO TABS: 1.0000 | ORAL_TABLET | Freq: Every day | ORAL | Status: DC

## 2015-10-07 ENCOUNTER — Other Ambulatory Visit: Payer: Self-pay

## 2015-10-07 DIAGNOSIS — I10 Essential (primary) hypertension: Secondary | ICD-10-CM

## 2015-10-07 MED ORDER — FUROSEMIDE 40 MG PO TABS: 40.0000 mg | ORAL_TABLET | Freq: Every day | ORAL | Status: DC

## 2015-10-07 NOTE — Telephone Encounter (Signed)
email from patient requesting Lasix refill, please refill, medication pulled down-aa

## 2015-10-19 ENCOUNTER — Other Ambulatory Visit: Payer: Self-pay

## 2015-10-19 MED ORDER — METOPROLOL TARTRATE 25 MG PO TABS: 25.0000 mg | ORAL_TABLET | Freq: Two times a day (BID) | ORAL | Status: DC

## 2015-10-19 NOTE — Telephone Encounter (Signed)
Called and verified with patient Metoprolol dose and he has been taking 25 mg 1 BID for at least 1 year, he has been cutting the tablets. Advised patient so there will not be confusion like now that we will send in the dose he is actual taking-aa

## 2015-11-10 ENCOUNTER — Other Ambulatory Visit: Payer: Self-pay | Admitting: Family Medicine

## 2015-11-11 ENCOUNTER — Other Ambulatory Visit: Payer: Self-pay | Admitting: Emergency Medicine

## 2015-11-11 MED ORDER — CLOPIDOGREL BISULFATE 75 MG PO TABS: 75.0000 mg | ORAL_TABLET | Freq: Every day | ORAL | Status: DC

## 2015-12-01 ENCOUNTER — Other Ambulatory Visit: Payer: Self-pay

## 2015-12-01 MED ORDER — PIOGLITAZONE HCL 45 MG PO TABS: 45.0000 mg | ORAL_TABLET | Freq: Every day | ORAL | 12 refills | Status: DC

## 2016-02-01 ENCOUNTER — Encounter: Payer: Self-pay | Admitting: Family Medicine

## 2016-02-01 ENCOUNTER — Ambulatory Visit (INDEPENDENT_AMBULATORY_CARE_PROVIDER_SITE_OTHER): Payer: Medicare PPO | Admitting: Family Medicine

## 2016-02-01 VITALS — BP 118/60 | HR 64 | Temp 97.7°F | Resp 16 | Wt 208.0 lb

## 2016-02-01 DIAGNOSIS — Z23 Encounter for immunization: Secondary | ICD-10-CM | POA: Diagnosis not present

## 2016-02-01 DIAGNOSIS — E784 Other hyperlipidemia: Secondary | ICD-10-CM

## 2016-02-01 DIAGNOSIS — I10 Essential (primary) hypertension: Secondary | ICD-10-CM | POA: Diagnosis not present

## 2016-02-01 DIAGNOSIS — E119 Type 2 diabetes mellitus without complications: Secondary | ICD-10-CM | POA: Diagnosis not present

## 2016-02-01 DIAGNOSIS — Z6832 Body mass index (BMI) 32.0-32.9, adult: Secondary | ICD-10-CM

## 2016-02-01 DIAGNOSIS — E7849 Other hyperlipidemia: Secondary | ICD-10-CM

## 2016-02-01 LAB — POCT GLYCOSYLATED HEMOGLOBIN (HGB A1C): Hemoglobin A1C: 6.1

## 2016-02-01 NOTE — Progress Notes (Signed)
Todd Olson  MRN: [id number] DOB: 1941/02/22  Subjective:  HPI  Patient is here for follow up.  Hypertension: patient checks his b/p and usually its around 110s/60s. No cardiac symptoms. BP Readings from Last 3 Encounters:  02/01/16 118/60  08/03/15 (!) 110/52  05/28/15 132/70   Diabetes: patient checks his sugar and in the last week it has been in 110s, before that in 120s-130s and then this morning it was 138. Lab Results  Component Value Date   HGBA1C 6.4 (H) 08/03/2015   Last routine labs were in April 2017. Patient Active Problem List   Diagnosis Date Noted  . Arteriosclerosis of nonautologous coronary artery bypass graft 09/10/2014  . Back pain, chronic 09/10/2014  . Diabetes mellitus, type 2 ([place]) 09/10/2014  . Well controlled diabetes mellitus ([place]) 09/10/2014  . Hemorrhoid 09/10/2014  . HLD (hyperlipidemia) 09/10/2014  . BP (high blood pressure) 09/10/2014  . Fatty tumor 09/10/2014  . Absolute anemia 09/10/2014  . Muscle ache 09/10/2014  . Arthritis of hand, degenerative 09/10/2014  . Benign neoplasm of colon 09/10/2014  . Rotator cuff syndrome 09/10/2014  . Head revolving around 09/10/2014  . Adynamia 09/10/2014    No past medical history on file.  Social History   Social History  . Marital status: Married    Spouse name: N/A  . Number of children: N/A  . Years of education: N/A   Occupational History  . Not on file.   Social History Main Topics  . Smoking status: Former Research scientist (life sciences)  . Smokeless tobacco: Former Systems developer    Quit date: 11/16/1977  . Alcohol use No  . Drug use: No  . Sexual activity: Not on file   Other Topics Concern  . Not on file   Social History Narrative  . No narrative on file    Outpatient Encounter Prescriptions as of 02/01/2016  Medication Sig Note  . amLODipine (NORVASC) 5 MG tablet Take 1 tablet (5 mg total) by mouth daily.   Marland Kitchen aspirin 81 MG tablet Take by mouth. 09/10/2014: Received from: [facility]    . Cholecalciferol 1000 UNITS capsule Take by mouth. 09/10/2014: Received from: [facility]  . clopidogrel (PLAVIX) 75 MG tablet TAKE ONE TABLET BY MOUTH ONCE DAILY   . furosemide (LASIX) 40 MG tablet Take 1 tablet (40 mg total) by mouth daily.   . Glucosamine-Chondroit-Vit C-Mn (GLUCOSAMINE CHONDR 1500 COMPLX) CAPS Take by mouth. 09/10/2014: Received from: [facility]  . glucose blood test strip ONETOUCH ULTRA BLUE (In Vitro Strip)  1 Strip two times daily and as needed for 0 days  Quantity: 100;  Refills: 11   Ordered :20-May-2014  Miguel Aschoff MD;  Started 20-May-2014 Active Comments: Medication taken as needed. E11.9 09/10/2014: Medication taken as needed. E11.9 Received from: [facility]  . glucose blood test strip Check sugar twice daily DX E11.9   . IRON, IRON, Take by mouth. 09/10/2014: Received from: [facility]  . lisinopril-hydrochlorothiazide (PRINZIDE,ZESTORETIC) 20-12.5 MG tablet Take 1 tablet by mouth daily.   Marland Kitchen lovastatin (MEVACOR) 40 MG tablet Take 1 tablet (40 mg total) by mouth at bedtime.   . magnesium oxide (MAG-OX) 400 (241.3 MG) MG tablet Take by mouth. 09/10/2014: generic ok Received from: [facility]  . metFORMIN (GLUCOPHAGE) 1000 MG tablet Take 1 tablet (1,000 mg total) by mouth 2 (two) times daily with a meal.   . metoprolol (LOPRESSOR) 25 MG tablet Take 1 tablet (25 mg total) by mouth 2 (  two) times daily.   . OMEGA-3 FATTY ACIDS PO Take by mouth. 09/10/2014: Received from: [facility]  . pioglitazone (ACTOS) 45 MG tablet Take 1 tablet (45 mg total) by mouth daily.   . [DISCONTINUED] clopidogrel (PLAVIX) 75 MG tablet Take 1 tablet (75 mg total) by mouth daily.    No facility-administered encounter medications on file as of 02/01/2016.     Allergies  Allergen Reactions  . Penicillins     Review of Systems  Constitutional: Negative.   Respiratory: Negative.    Cardiovascular: Negative.   Musculoskeletal: Negative.   Neurological: Negative.    Objective:  BP 118/60   Pulse 64   Temp 97.7 F (36.5 C)   Resp 16   Wt 208 lb (94.3 kg)   BMI 32.10 kg/m   Physical Exam  Constitutional: He is oriented to person, place, and time and well-developed, well-nourished, and in no distress.  HENT:  Head: Normocephalic and atraumatic.  Eyes: Conjunctivae are normal. Pupils are equal, round, and reactive to light.  Neck: Normal range of motion. Neck supple.  Cardiovascular: Normal rate, regular rhythm, normal heart sounds and intact distal pulses.   No murmur heard. Pulmonary/Chest: Effort normal and breath sounds normal. No respiratory distress. He has no wheezes.  Musculoskeletal: He exhibits no edema or tenderness.  Neurological: He is alert and oriented to person, place, and time.  Psychiatric: Mood, memory, affect and judgment normal.    Assessment and Plan :  1. Type 2 diabetes mellitus without complication, without long-term current use of insulin (HCC)/Prediabetes A1C 6.1. Better. Continue current medication. - POCT HgB A1C  2. Essential hypertension Stable. Continue current medication.  3. Other hyperlipidemia Stable on last check.  4. Need for influenza vaccination - Flu vaccine HIGH DOSE PF (Fluzone High dose)  5. BMI 32.0-32.9,adult Work on habits.  HPI, Exam and A&P transcribed under direction and in the presence of Miguel Aschoff, MD. I have done the exam and reviewed the chart and it is accurate to the best of my knowledge. Miguel Aschoff M.D. [facility]

## 2016-02-18 ENCOUNTER — Other Ambulatory Visit: Payer: Self-pay

## 2016-02-18 MED ORDER — LOVASTATIN 40 MG PO TABS: 40.0000 mg | ORAL_TABLET | Freq: Every day | ORAL | 12 refills | Status: DC

## 2016-05-31 ENCOUNTER — Ambulatory Visit (INDEPENDENT_AMBULATORY_CARE_PROVIDER_SITE_OTHER): Payer: Medicare PPO | Admitting: Family Medicine

## 2016-05-31 ENCOUNTER — Encounter: Payer: Self-pay | Admitting: Family Medicine

## 2016-05-31 VITALS — BP 126/58 | HR 78 | Temp 98.4°F | Resp 14 | Ht 67.0 in | Wt 207.0 lb

## 2016-05-31 DIAGNOSIS — Z1211 Encounter for screening for malignant neoplasm of colon: Secondary | ICD-10-CM

## 2016-05-31 DIAGNOSIS — E784 Other hyperlipidemia: Secondary | ICD-10-CM | POA: Diagnosis not present

## 2016-05-31 DIAGNOSIS — E119 Type 2 diabetes mellitus without complications: Secondary | ICD-10-CM

## 2016-05-31 DIAGNOSIS — Z23 Encounter for immunization: Secondary | ICD-10-CM

## 2016-05-31 DIAGNOSIS — E7849 Other hyperlipidemia: Secondary | ICD-10-CM

## 2016-05-31 DIAGNOSIS — I10 Essential (primary) hypertension: Secondary | ICD-10-CM

## 2016-05-31 DIAGNOSIS — S60512A Abrasion of left hand, initial encounter: Secondary | ICD-10-CM

## 2016-05-31 DIAGNOSIS — Z0000 Encounter for general adult medical examination without abnormal findings: Secondary | ICD-10-CM

## 2016-05-31 DIAGNOSIS — I2510 Atherosclerotic heart disease of native coronary artery without angina pectoris: Secondary | ICD-10-CM | POA: Diagnosis not present

## 2016-05-31 LAB — IFOBT (OCCULT BLOOD): IFOBT: NEGATIVE

## 2016-05-31 LAB — POCT UA - MICROALBUMIN: Microalbumin Ur, POC: 20 mg/L

## 2016-05-31 NOTE — Progress Notes (Signed)
Patient: Todd Olson, Male    DOB: 02/21/1941, 76 y.o.   MRN: [id number] Visit Date: 05/31/2016  Today's Provider: [name], MD   Chief Complaint  Patient presents with  . Medicare Wellness   Subjective:   Todd Olson is a 76 y.o. male who presents today for his Subsequent Annual Wellness Visit. He feels well. He reports exercising no specific exercise routine, has severe hip pain. He reports he is sleeping well.  Immunization History  Administered Date(s) Administered  . Influenza, High Dose Seasonal PF 01/26/2015, 02/01/2016  . Pneumococcal Conjugate-13 11/14/2013  . Pneumococcal Polysaccharide-23 10/17/2011   Last colonoscopy 08/05/08 internal hemorrhoids, 1 polyp, otherwise normal Review of Systems  Constitutional: Negative.   HENT: Negative.   Eyes: Negative.   Respiratory: Negative.   Cardiovascular: Negative.   Gastrointestinal: Negative.   Endocrine: Negative.   Genitourinary: Negative.   Musculoskeletal: Negative.   Skin: Negative.   Allergic/Immunologic: Negative.   Neurological: Positive for headaches.  Hematological: Bruises/bleeds easily.  Psychiatric/Behavioral: Negative.     Patient Active Problem List   Diagnosis Date Noted  . Arteriosclerosis of nonautologous coronary artery bypass graft 09/10/2014  . Back pain, chronic 09/10/2014  . Diabetes mellitus, type 2 ([place]) 09/10/2014  . Well controlled diabetes mellitus ([place]) 09/10/2014  . Hemorrhoid 09/10/2014  . HLD (hyperlipidemia) 09/10/2014  . BP (high blood pressure) 09/10/2014  . Fatty tumor 09/10/2014  . Absolute anemia 09/10/2014  . Muscle ache 09/10/2014  . Arthritis of hand, degenerative 09/10/2014  . Benign neoplasm of colon 09/10/2014  . Rotator cuff syndrome 09/10/2014  . Head revolving around 09/10/2014  . Adynamia 09/10/2014    Social History   Social History  . Marital status: Married    Spouse name: N/A  . Number of children: N/A  . Years of education: N/A    Occupational History  . Not on file.   Social History Main Topics  . Smoking status: Former Research scientist (life sciences)  . Smokeless tobacco: Former Systems developer    Quit date: 11/16/1977  . Alcohol use No  . Drug use: No  . Sexual activity: Not on file   Other Topics Concern  . Not on file   Social History Narrative  . No narrative on file    Past Surgical History:  Procedure Laterality Date  . CHOLECYSTECTOMY  2012   Dr. Rochel Brome    His family history includes Bipolar disorder in his sister; Diabetes in his brother and brother; Heart disease in his brother, brother, father, and mother; Hypertension in his brother and father.     Outpatient Encounter Prescriptions as of 05/31/2016  Medication Sig Note  . amLODipine (NORVASC) 5 MG tablet Take 1 tablet (5 mg total) by mouth daily.   Marland Kitchen aspirin 81 MG tablet Take by mouth. 09/10/2014: Received from: [facility]  . Cholecalciferol 1000 UNITS capsule Take by mouth. 09/10/2014: Received from: [facility]  . clopidogrel (PLAVIX) 75 MG tablet TAKE ONE TABLET BY MOUTH ONCE DAILY   . furosemide (LASIX) 40 MG tablet Take 1 tablet (40 mg total) by mouth daily.   . Glucosamine-Chondroit-Vit C-Mn (GLUCOSAMINE CHONDR 1500 COMPLX) CAPS Take by mouth. 09/10/2014: Received from: [facility]  . glucose blood test strip ONETOUCH ULTRA BLUE (In Vitro Strip)  1 Strip two times daily and as needed for 0 days  Quantity: 100;  Refills: 11   Ordered :20-May-2014  Todd Aschoff MD;  Started 20-May-2014 Active Comments: Medication taken as needed. E11.9 09/10/2014: Medication  taken as needed. E11.9 Received from: [facility]  . glucose blood test strip Check sugar twice daily DX E11.9   . IRON, IRON, Take by mouth. 09/10/2014: Received from: [facility]  . lisinopril-hydrochlorothiazide (PRINZIDE,ZESTORETIC) 20-12.5 MG tablet Take 1 tablet by mouth daily.   Marland Kitchen lovastatin (MEVACOR) 40 MG tablet  Take 1 tablet (40 mg total) by mouth at bedtime.   . magnesium oxide (MAG-OX) 400 (241.3 MG) MG tablet Take by mouth. 09/10/2014: generic ok Received from: [facility]  . metFORMIN (GLUCOPHAGE) 1000 MG tablet Take 1 tablet (1,000 mg total) by mouth 2 (two) times daily with a meal.   . metoprolol (LOPRESSOR) 25 MG tablet Take 1 tablet (25 mg total) by mouth 2 (two) times daily.   . OMEGA-3 FATTY ACIDS PO Take by mouth. 09/10/2014: Received from: [facility]  . pioglitazone (ACTOS) 45 MG tablet Take 1 tablet (45 mg total) by mouth daily.    No facility-administered encounter medications on file as of 05/31/2016.     Allergies  Allergen Reactions  . Penicillins     Patient Care Team: Jerrol Banana., MD as PCP - General (Family Medicine)   Objective:   Vitals:  Vitals:   05/31/16 0942  BP: (!) 126/58  Pulse: 78  Resp: 14  Temp: 98.4 F (36.9 C)  Weight: 207 lb (93.9 kg)  Height: 5\' 7"  (1.702 m)    Physical Exam  Constitutional: He is oriented to person, place, and time. He appears well-developed and well-nourished.  HENT:  Head: Normocephalic and atraumatic.  Right Ear: External ear normal.  Left Ear: External ear normal.  Mouth/Throat: Oropharynx is clear and moist.  Eyes: Conjunctivae are normal. Pupils are equal, round, and reactive to light.  Neck: Normal range of motion. Neck supple.  Cardiovascular: Normal rate, regular rhythm, normal heart sounds and intact distal pulses.   No murmur heard. Pulmonary/Chest: Effort normal and breath sounds normal. No respiratory distress. He has no wheezes.  Abdominal: Soft. He exhibits no distension. There is no tenderness.  Genitourinary: Rectum normal and penis normal. Rectal exam shows guaiac negative stool. No penile tenderness.  Musculoskeletal: He exhibits no edema or tenderness.  Neurological: He is alert and oriented to person, place, and time.  Skin: Skin is warm and dry. No rash  noted. No erythema.  Abrasion on the left lateral hand  Psychiatric: He has a normal mood and affect. His behavior is normal. Judgment and thought content normal.    Activities of Daily Living In your present state of health, do you have any difficulty performing the following activities: 05/31/2016  Hearing? N  Vision? N  Difficulty concentrating or making decisions? Y  Walking or climbing stairs? N  Dressing or bathing? N  Doing errands, shopping? N  Some recent data might be hidden    Fall Risk Assessment Fall Risk  05/31/2016 05/28/2015  Falls in the past year? No No     Depression Screen PHQ 2/9 Scores 05/31/2016 05/28/2015  PHQ - 2 Score 0 0   Diabetic Foot Exam - Simple   Simple Foot Form Diabetic Foot exam was performed with the following findings:  Yes 05/31/2016 10:19 AM  Visual Inspection No deformities, no ulcerations, no other skin breakdown bilaterally:  Yes Sensation Testing Intact to touch and monofilament testing bilaterally:  Yes Pulse Check Posterior Tibialis and Dorsalis pulse intact bilaterally:  Yes Comments     Cognitive Testing - 6-CIT    Year: 0  4 points  Month: 0 3 points  Memorize "Pia Mau, [address] [place]"  Time (within 1 hour:) 0 3 points  Count backwards from 20: 0 2 4 points  Name months of year: 0 2 4 points  Repeat Address: 0 2 4 6 8 10  points   Total Score: 0/28  Interpretation : Normal (0-7) Abnormal (8-28)  Assessment & Plan:     Annual Wellness Visit  Reviewed patient's Family Medical History Reviewed and updated list of patient's medical providers Assessment of cognitive impairment was done Assessed patient's functional ability Established a written schedule for health screening [place] Completed and Reviewed 1. Medicare annual wellness visit, subsequent  2. Colon cancer screening - IFOBT POC (occult bld, rslt in office)-negative  3. Abrasion of left hand, initial encounter Tetanus  administered today. No record of Tetanus in the chart from before.  4. Type 2 diabetes mellitus without complication, without long-term current use of insulin (HCC) - POCT UA - Microalbumin-20. Patient is on medication to protect his kidneys already. - Hemoglobin A1C  5. Essential hypertension - CBC w/Diff/Platelet - Comprehensive metabolic panel - TSH  6. Other hyperlipidemia - Lipid Panel With LDL/HDL Ratio - Comprehensive metabolic panel - TSH  7. Need for immunization against tetanus alone 8.CAD All risk factors treated. HPI, Exam and A&P transcribed under direction and in the presence of Todd Aschoff, MD. I have done the exam and reviewed the chart and it is accurate to the best of my knowledge. Development worker, community has been used and  any errors in dictation or transcription are unintentional. Todd Olson M.D. [facility]     Discussed health benefits of physical activity, and encouraged him to engage in regular exercise appropriate for his age and condition.

## 2016-06-01 LAB — CBC WITH DIFFERENTIAL/PLATELET
Basophils Absolute: 0 10*3/uL (ref 0.0–0.2)
Basos: 0 %
EOS (ABSOLUTE): 0.2 10*3/uL (ref 0.0–0.4)
Eos: 2 %
HEMATOCRIT: 44.3 % (ref 37.5–51.0)
Hemoglobin: 15.1 g/dL (ref 13.0–17.7)
Immature Grans (Abs): 0 10*3/uL (ref 0.0–0.1)
Immature Granulocytes: 1 %
LYMPHS ABS: 1.4 10*3/uL (ref 0.7–3.1)
Lymphs: 22 %
MCH: 30.3 pg (ref 26.6–33.0)
MCHC: 34.1 g/dL (ref 31.5–35.7)
MCV: 89 fL (ref 79–97)
MONOS ABS: 0.5 10*3/uL (ref 0.1–0.9)
Monocytes: 7 %
Neutrophils Absolute: 4.5 10*3/uL (ref 1.4–7.0)
Neutrophils: 68 %
PLATELETS: 218 10*3/uL (ref 150–379)
RBC: 4.99 x10E6/uL (ref 4.14–5.80)
RDW: 14.5 % (ref 12.3–15.4)
WBC: 6.5 10*3/uL (ref 3.4–10.8)

## 2016-06-01 LAB — HEMOGLOBIN A1C
Est. average glucose Bld gHb Est-mCnc: 128 mg/dL
HEMOGLOBIN A1C: 6.1 % — AB (ref 4.8–5.6)

## 2016-06-01 LAB — COMPREHENSIVE METABOLIC PANEL
ALBUMIN: 4.7 g/dL (ref 3.5–4.8)
ALK PHOS: 53 IU/L (ref 39–117)
ALT: 18 IU/L (ref 0–44)
AST: 22 IU/L (ref 0–40)
Albumin/Globulin Ratio: 2 (ref 1.2–2.2)
BUN / CREAT RATIO: 15 (ref 10–24)
BUN: 15 mg/dL (ref 8–27)
Bilirubin Total: 0.6 mg/dL (ref 0.0–1.2)
CO2: 25 mmol/L (ref 18–29)
CREATININE: 1.02 mg/dL (ref 0.76–1.27)
Calcium: 9.9 mg/dL (ref 8.6–10.2)
Chloride: 98 mmol/L (ref 96–106)
GFR calc Af Amer: 83 mL/min/{1.73_m2} (ref >59)
GFR calc non Af Amer: 72 mL/min/{1.73_m2} (ref >59)
GLUCOSE: 129 mg/dL — AB (ref 65–99)
Globulin, Total: 2.4 g/dL (ref 1.5–4.5)
Potassium: 4.1 mmol/L (ref 3.5–5.2)
Sodium: 140 mmol/L (ref 134–144)
Total Protein: 7.1 g/dL (ref 6.0–8.5)

## 2016-06-01 LAB — LIPID PANEL WITH LDL/HDL RATIO
Cholesterol, Total: 157 mg/dL (ref 100–199)
HDL: 56 mg/dL (ref >39)
LDL Calculated: 66 mg/dL (ref 0–99)
LDL/HDL RATIO: 1.2 ratio (ref 0.0–3.6)
TRIGLYCERIDES: 177 mg/dL — AB (ref 0–149)
VLDL Cholesterol Cal: 35 mg/dL (ref 5–40)

## 2016-06-01 LAB — TSH: TSH: 1.34 u[IU]/mL (ref 0.450–4.500)

## 2016-07-04 ENCOUNTER — Other Ambulatory Visit: Payer: Self-pay

## 2016-07-04 MED ORDER — AMLODIPINE BESYLATE 5 MG PO TABS: 5.0000 mg | ORAL_TABLET | Freq: Every day | ORAL | 12 refills | Status: DC

## 2016-07-15 ENCOUNTER — Other Ambulatory Visit: Payer: Self-pay

## 2016-07-15 MED ORDER — GLUCOSE BLOOD VI STRP: ORAL_STRIP | 3 refills | Status: DC

## 2016-07-15 MED ORDER — GLUCOSE BLOOD VI STRP: ORAL_STRIP | 12 refills | Status: DC

## 2016-07-22 ENCOUNTER — Other Ambulatory Visit: Payer: Self-pay

## 2016-07-22 MED ORDER — METFORMIN HCL 1000 MG PO TABS: 1000.0000 mg | ORAL_TABLET | Freq: Two times a day (BID) | ORAL | 3 refills | Status: DC

## 2016-08-08 ENCOUNTER — Ambulatory Visit: Payer: Medicare PPO | Admitting: Family Medicine

## 2016-09-15 ENCOUNTER — Other Ambulatory Visit: Payer: Self-pay

## 2016-09-15 MED ORDER — LISINOPRIL-HYDROCHLOROTHIAZIDE 20-12.5 MG PO TABS: 1.0000 | ORAL_TABLET | Freq: Every day | ORAL | 3 refills | Status: DC

## 2016-10-10 ENCOUNTER — Encounter: Payer: Self-pay | Admitting: Family Medicine

## 2016-10-10 ENCOUNTER — Ambulatory Visit (INDEPENDENT_AMBULATORY_CARE_PROVIDER_SITE_OTHER): Payer: Medicare PPO | Admitting: Family Medicine

## 2016-10-10 VITALS — BP 108/62 | HR 62 | Temp 98.4°F | Resp 16 | Wt 208.0 lb

## 2016-10-10 DIAGNOSIS — I10 Essential (primary) hypertension: Secondary | ICD-10-CM

## 2016-10-10 DIAGNOSIS — M6740 Ganglion, unspecified site: Secondary | ICD-10-CM | POA: Diagnosis not present

## 2016-10-10 DIAGNOSIS — E7800 Pure hypercholesterolemia, unspecified: Secondary | ICD-10-CM

## 2016-10-10 DIAGNOSIS — E119 Type 2 diabetes mellitus without complications: Secondary | ICD-10-CM

## 2016-10-10 LAB — POCT GLYCOSYLATED HEMOGLOBIN (HGB A1C)
Est. average glucose Bld gHb Est-mCnc: 134
HEMOGLOBIN A1C: 6.3

## 2016-10-10 NOTE — Progress Notes (Signed)
Patient: Todd Olson Male    DOB: 09/12/1940   76 y.o.   MRN: [id number] Visit Date: 10/10/2016  Today's Provider: [name], MD   Chief Complaint  Patient presents with  . Diabetes  . Hypertension  . Hyperlipidemia  . Arthritis  . Eye Pain   Subjective:    HPI  Diabetes Mellitus Type II, Follow-up:   Lab Results  Component Value Date   HGBA1C 6.1 (H) 05/31/2016   HGBA1C 6.1 02/01/2016   HGBA1C 6.4 (H) 08/03/2015    Last seen for diabetes 6 months ago.  Management since then includes no changes. He reports good compliance with treatment. He is not having side effects.  Current symptoms include none and have been stable. Home blood sugar records: fasting range: 120s  Episodes of hypoglycemia? no   Current Insulin Regimen: none Most Recent Eye Exam: up to date Weight trend: stable Prior visit with dietician: no Current diet: well balanced Current exercise: walking, occasionally.   Pertinent Labs:    Component Value Date/Time   CHOL 157 05/31/2016 1103   TRIG 177 (H) 05/31/2016 1103   HDL 56 05/31/2016 1103   LDLCALC 66 05/31/2016 1103   CREATININE 1.02 05/31/2016 1103   CREATININE 1.26 03/27/2013 1946    Wt Readings from Last 3 Encounters:  10/10/16 208 lb (94.3 kg)  05/31/16 207 lb (93.9 kg)  02/01/16 208 lb (94.3 kg)      Hypertension, follow-up:  BP Readings from Last 3 Encounters:  10/10/16 108/62  05/31/16 (!) 126/58  02/01/16 118/60    He was last seen for hypertension 6 months ago.  BP at that visit was 126/58. Management since that visit includes no changes. He reports good compliance with treatment. He is not having side effects.  He is exercising. He is adherent to low salt diet.   Outside blood pressures are checked daily. He is experiencing none.  Patient denies exertional chest pressure/discomfort, lower extremity edema and palpitations.   Cardiovascular risk factors include diabetes mellitus and  dyslipidemia.     Weight trend: stable Wt Readings from Last 3 Encounters:  10/10/16 208 lb (94.3 kg)  05/31/16 207 lb (93.9 kg)  02/01/16 208 lb (94.3 kg)    Current diet: well balanced    Lipid/Cholesterol, Follow-up:   Last seen for this6 months ago.  Management changes since that visit include no changes. . Last Lipid Panel:    Component Value Date/Time   CHOL 157 05/31/2016 1103   TRIG 177 (H) 05/31/2016 1103   HDL 56 05/31/2016 1103   LDLCALC 66 05/31/2016 1103    Risk factors for vascular disease include diabetes mellitus and hypertension  He reports good compliance with treatment. He is not having side effects.  Current symptoms include none and have been stable. Weight trend: stable Prior visit with dietician: no Current diet: well balanced Current exercise: walking, occasionally  Wt Readings from Last 3 Encounters:  10/10/16 208 lb (94.3 kg)  05/31/16 207 lb (93.9 kg)  02/01/16 208 lb (94.3 kg)   Joint pain: Patient reports that he has had joint pain mainly in his right hands for several weeks. He reports that this has worsened over the last 2-3 weeks. Patient mentions that he does have history of arthritis and gout. He reports that the pain is constant.   Eye pain: Patient reports that he has pain occasionally in his left eye that comes and goes. He would like this evaluated today.  Allergies  Allergen Reactions  . Penicillins      Current Outpatient Prescriptions:  .  amLODipine (NORVASC) 5 MG tablet, Take 1 tablet (5 mg total) by mouth daily., Disp: 30 tablet, Rfl: 12 .  aspirin 81 MG tablet, Take by mouth., Disp: , Rfl:  .  Cholecalciferol 1000 UNITS capsule, Take by mouth., Disp: , Rfl:  .  clopidogrel (PLAVIX) 75 MG tablet, TAKE ONE TABLET BY MOUTH ONCE DAILY, Disp: 30 tablet, Rfl: 12 .  furosemide (LASIX) 40 MG tablet, Take 1 tablet (40 mg total) by mouth daily., Disp: 90 tablet, Rfl: 3 .  Glucosamine-Chondroit-Vit C-Mn  (GLUCOSAMINE CHONDR 1500 COMPLX) CAPS, Take by mouth., Disp: , Rfl:  .  glucose blood test strip, Check sugar once daily DX E11.9, Disp: 90 each, Rfl: 3 .  glucose blood test strip, ONETOUCH ULTRA BLUE (In Vitro Strip)  Check sugar twice daily. DX E11.9, Disp: 100 each, Rfl: 12 .  IRON, IRON,, Take by mouth., Disp: , Rfl:  .  lisinopril-hydrochlorothiazide (PRINZIDE,ZESTORETIC) 20-12.5 MG tablet, Take 1 tablet by mouth daily., Disp: 90 tablet, Rfl: 3 .  lovastatin (MEVACOR) 40 MG tablet, Take 1 tablet (40 mg total) by mouth at bedtime., Disp: 30 tablet, Rfl: 12 .  magnesium oxide (MAG-OX) 400 (241.3 MG) MG tablet, Take by mouth., Disp: , Rfl:  .  metFORMIN (GLUCOPHAGE) 1000 MG tablet, Take 1 tablet (1,000 mg total) by mouth 2 (two) times daily with a meal., Disp: 180 tablet, Rfl: 3 .  metoprolol (LOPRESSOR) 25 MG tablet, Take 1 tablet (25 mg total) by mouth 2 (two) times daily., Disp: 180 tablet, Rfl: 3 .  OMEGA-3 FATTY ACIDS PO, Take by mouth., Disp: , Rfl:  .  pioglitazone (ACTOS) 45 MG tablet, Take 1 tablet (45 mg total) by mouth daily., Disp: 30 tablet, Rfl: 12  Review of Systems  Constitutional: Negative.   HENT: Negative.   Eyes: Positive for pain.  Respiratory: Negative.   Cardiovascular: Negative.   Endocrine: Negative.   Musculoskeletal: Positive for arthralgias and joint swelling.  Allergic/Immunologic: Negative.   Neurological: Negative.   Hematological: Bruises/bleeds easily.  Psychiatric/Behavioral: Negative.     Social History  Substance Use Topics  . Smoking status: Former Research scientist (life sciences)  . Smokeless tobacco: Former Systems developer    Quit date: 11/16/1977  . Alcohol use No   Objective:   BP 108/62 (BP Location: Right Arm, Patient Position: Sitting, Cuff Size: Normal)   Pulse 62   Temp 98.4 F (36.9 C)   Resp 16   Wt 208 lb (94.3 kg)   SpO2 97%   BMI 32.58 kg/m  Vitals:   10/10/16 0811  BP: 108/62  Pulse: 62  Resp: 16  Temp: 98.4 F (36.9 C)  SpO2: 97%  Weight: 208 lb  (94.3 kg)     Physical Exam  Constitutional: He is oriented to person, place, and time. He appears well-developed and well-nourished.  HENT:  Head: Normocephalic and atraumatic.  Right Ear: External ear normal.  Left Ear: External ear normal.  Nose: Nose normal.  Eyes: Conjunctivae are normal.  Neck: Normal range of motion. Neck supple. No thyromegaly present.  Cardiovascular: Normal rate, regular rhythm and normal heart sounds.   Pulmonary/Chest: Effort normal and breath sounds normal.  Abdominal: Soft.  Musculoskeletal: Normal range of motion. He exhibits edema.  Cyst on MCP joint.   Neurological: He is alert and oriented to person, place, and time.  Skin: Skin is warm and dry.  Psychiatric: He has a normal  mood and affect. His behavior is normal. Judgment and thought content normal.        Assessment & Plan:     1. Essential hypertension Will only take Lasix as needed. Patient describes mild orthostasis in recent months  2. Type 2 diabetes mellitus without complication, without long-term current use of insulin (HCC) Good control. - POCT glycosylated hemoglobin (Hb A1C)--6.3   3. Pure hypercholesterolemia Treated.  4. Ganglion cyst 5. CAD All risk factors treated 6. Edema Patient on 40 of Lasix daily but blood pressure seems to be mildly symptomatically low. At this time we'll change Lasix to 40 mg when necessary. If he wakes up with lower extremity edema he will use Lasix or if he has a 3 pound weight gain. We'll see him back in 3-4 months instead of 6 months.      I have done the exam and reviewed the chart and it is accurate to the best of my knowledge. Development worker, community has been used and  any errors in dictation or transcription are unintentional. Miguel Aschoff M.D. [place], MD  [facility]

## 2016-10-10 NOTE — Patient Instructions (Signed)
Only use Lasix as needed for swelling or gains more than 3lbs in a day.

## 2016-10-14 ENCOUNTER — Other Ambulatory Visit: Payer: Self-pay | Admitting: Family Medicine

## 2016-10-14 DIAGNOSIS — I10 Essential (primary) hypertension: Secondary | ICD-10-CM

## 2016-10-19 ENCOUNTER — Other Ambulatory Visit: Payer: Self-pay | Admitting: Family Medicine

## 2016-10-27 LAB — HM DIABETES EYE EXAM

## 2016-11-03 ENCOUNTER — Encounter: Payer: Self-pay | Admitting: Family Medicine

## 2016-11-16 ENCOUNTER — Other Ambulatory Visit: Payer: Self-pay

## 2016-11-16 ENCOUNTER — Ambulatory Visit
Admission: RE | Admit: 2016-11-16 | Discharge: 2016-11-16 | Disposition: A | Discharge status: Home / Self Care | Payer: Medicare PPO | Source: Ambulatory Visit | Attending: Family Medicine | Admitting: Family Medicine

## 2016-11-16 DIAGNOSIS — M1712 Unilateral primary osteoarthritis, left knee: Secondary | ICD-10-CM | POA: Insufficient documentation

## 2016-11-16 DIAGNOSIS — M25561 Pain in right knee: Secondary | ICD-10-CM

## 2016-11-16 DIAGNOSIS — M25562 Pain in left knee: Principal | ICD-10-CM

## 2016-11-16 DIAGNOSIS — M778 Other enthesopathies, not elsewhere classified: Secondary | ICD-10-CM | POA: Insufficient documentation

## 2016-11-30 ENCOUNTER — Encounter: Payer: Self-pay | Admitting: *Deleted

## 2016-12-04 ENCOUNTER — Other Ambulatory Visit: Payer: Self-pay | Admitting: Family Medicine

## 2016-12-06 ENCOUNTER — Encounter: Payer: Self-pay | Admitting: *Deleted

## 2016-12-06 ENCOUNTER — Ambulatory Visit
Admission: RE | Admit: 2016-12-06 | Discharge: 2016-12-06 | Disposition: A | Discharge status: Home / Self Care | Payer: Medicare PPO | Source: Ambulatory Visit | Attending: Ophthalmology | Admitting: Ophthalmology

## 2016-12-06 ENCOUNTER — Encounter
Admission: RE | Disposition: A | Discharge status: Home / Self Care | Payer: Self-pay | Source: Ambulatory Visit | Attending: Ophthalmology

## 2016-12-06 ENCOUNTER — Ambulatory Visit: Payer: Medicare PPO | Admitting: Anesthesiology

## 2016-12-06 DIAGNOSIS — Z87891 Personal history of nicotine dependence: Secondary | ICD-10-CM | POA: Insufficient documentation

## 2016-12-06 DIAGNOSIS — D649 Anemia, unspecified: Secondary | ICD-10-CM | POA: Insufficient documentation

## 2016-12-06 DIAGNOSIS — Z79899 Other long term (current) drug therapy: Secondary | ICD-10-CM | POA: Insufficient documentation

## 2016-12-06 DIAGNOSIS — I252 Old myocardial infarction: Secondary | ICD-10-CM | POA: Insufficient documentation

## 2016-12-06 DIAGNOSIS — Z7982 Long term (current) use of aspirin: Secondary | ICD-10-CM | POA: Insufficient documentation

## 2016-12-06 DIAGNOSIS — E1136 Type 2 diabetes mellitus with diabetic cataract: Secondary | ICD-10-CM | POA: Diagnosis not present

## 2016-12-06 DIAGNOSIS — Z7984 Long term (current) use of oral hypoglycemic drugs: Secondary | ICD-10-CM | POA: Diagnosis not present

## 2016-12-06 DIAGNOSIS — I509 Heart failure, unspecified: Secondary | ICD-10-CM | POA: Insufficient documentation

## 2016-12-06 DIAGNOSIS — I110 Hypertensive heart disease with heart failure: Secondary | ICD-10-CM | POA: Diagnosis not present

## 2016-12-06 DIAGNOSIS — H2512 Age-related nuclear cataract, left eye: Secondary | ICD-10-CM | POA: Diagnosis present

## 2016-12-06 DIAGNOSIS — I2510 Atherosclerotic heart disease of native coronary artery without angina pectoris: Secondary | ICD-10-CM | POA: Insufficient documentation

## 2016-12-06 DIAGNOSIS — E7800 Pure hypercholesterolemia, unspecified: Secondary | ICD-10-CM | POA: Diagnosis not present

## 2016-12-06 HISTORY — DX: Atherosclerotic heart disease of native coronary artery without angina pectoris: I25.10

## 2016-12-06 HISTORY — DX: Heart failure, unspecified: I50.9

## 2016-12-06 HISTORY — PX: CATARACT EXTRACTION W/PHACO: SHX586

## 2016-12-06 HISTORY — DX: Acute myocardial infarction, unspecified: I21.9

## 2016-12-06 HISTORY — DX: Personal history of other diseases of the digestive system: Z87.19

## 2016-12-06 HISTORY — DX: Essential (primary) hypertension: I10

## 2016-12-06 HISTORY — DX: Anemia, unspecified: D64.9

## 2016-12-06 HISTORY — DX: Type 2 diabetes mellitus without complications: E11.9

## 2016-12-06 LAB — GLUCOSE, CAPILLARY: GLUCOSE-CAPILLARY: 126 mg/dL — AB (ref 65–99)

## 2016-12-06 SURGERY — PHACOEMULSIFICATION, CATARACT, WITH IOL INSERTION
Anesthesia: Monitor Anesthesia Care | Site: Eye | Laterality: Left | Wound class: Clean

## 2016-12-06 MED ORDER — MIDAZOLAM HCL 2 MG/2ML IJ SOLN
INTRAMUSCULAR | Status: AC
  Filled 2016-12-06: qty 2

## 2016-12-06 MED ORDER — SODIUM CHLORIDE 0.9 % IV SOLN
INTRAVENOUS | Status: DC
  Administered 2016-12-06: 50 mL/h via INTRAVENOUS

## 2016-12-06 MED ORDER — CARBACHOL 0.01 % IO SOLN
INTRAOCULAR | Status: DC | PRN
  Administered 2016-12-06: .5 mL via INTRAOCULAR

## 2016-12-06 MED ORDER — POVIDONE-IODINE 5 % OP SOLN
OPHTHALMIC | Status: DC | PRN
Start: 2016-12-06 — End: 2016-12-06
  Administered 2016-12-06: 1 via OPHTHALMIC

## 2016-12-06 MED ORDER — ARMC OPHTHALMIC DILATING DROPS
1.0000 "application " | OPHTHALMIC | Status: AC
  Administered 2016-12-06 (×3): 1 via OPHTHALMIC

## 2016-12-06 MED ORDER — NA CHONDROIT SULF-NA HYALURON 40-17 MG/ML IO SOLN
INTRAOCULAR | Status: DC | PRN
  Administered 2016-12-06: 1 mL via INTRAOCULAR

## 2016-12-06 MED ORDER — METOPROLOL TARTRATE 25 MG PO TABS
ORAL_TABLET | ORAL | Status: AC
  Administered 2016-12-06: 25 mg via ORAL
  Filled 2016-12-06: qty 1

## 2016-12-06 MED ORDER — MIDAZOLAM HCL 2 MG/2ML IJ SOLN
INTRAMUSCULAR | Status: DC | PRN
  Administered 2016-12-06: 2 mg via INTRAVENOUS

## 2016-12-06 MED ORDER — EPINEPHRINE PF 1 MG/ML IJ SOLN
INTRAOCULAR | Status: DC | PRN
  Administered 2016-12-06: 1 mL via OPHTHALMIC

## 2016-12-06 MED ORDER — LIDOCAINE HCL (PF) 4 % IJ SOLN
INTRAMUSCULAR | Status: DC | PRN
  Administered 2016-12-06: 2 mL via OPHTHALMIC

## 2016-12-06 MED ORDER — MOXIFLOXACIN HCL 0.5 % OP SOLN
OPHTHALMIC | Status: AC
  Filled 2016-12-06: qty 3

## 2016-12-06 MED ORDER — MOXIFLOXACIN HCL 0.5 % OP SOLN
OPHTHALMIC | Status: DC | PRN
  Administered 2016-12-06: .2 mL via OPHTHALMIC

## 2016-12-06 MED ORDER — ARMC OPHTHALMIC DILATING DROPS
OPHTHALMIC | Status: AC
  Administered 2016-12-06: 1 via OPHTHALMIC
  Filled 2016-12-06: qty 0.4

## 2016-12-06 MED ORDER — MOXIFLOXACIN HCL 0.5 % OP SOLN: 1.0000 [drp] | OPHTHALMIC | Status: DC | PRN

## 2016-12-06 MED ORDER — METOPROLOL TARTRATE 25 MG PO TABS
25.0000 mg | ORAL_TABLET | Freq: Once | ORAL | Status: AC
  Administered 2016-12-06: 25 mg via ORAL

## 2016-12-06 SURGICAL SUPPLY — 16 items
GLOVE BIO SURGEON STRL SZ8 (GLOVE) ×3 IMPLANT
GLOVE BIOGEL M 6.5 STRL (GLOVE) ×3 IMPLANT
GLOVE SURG LX 8.0 MICRO (GLOVE) ×2
GLOVE SURG LX STRL 8.0 MICRO (GLOVE) ×1 IMPLANT
GOWN STRL REUS W/ TWL LRG LVL3 (GOWN DISPOSABLE) ×2 IMPLANT
GOWN STRL REUS W/TWL LRG LVL3 (GOWN DISPOSABLE) ×4
LABEL CATARACT MEDS ST (LABEL) ×3 IMPLANT
LENS IOL TECNIS ITEC 20.0 (Intraocular Lens) ×3 IMPLANT
PACK CATARACT (MISCELLANEOUS) ×3 IMPLANT
PACK CATARACT BRASINGTON LX (MISCELLANEOUS) ×3 IMPLANT
PACK EYE AFTER SURG (MISCELLANEOUS) ×3 IMPLANT
SOL BSS BAG (MISCELLANEOUS) ×3
SOLUTION BSS BAG (MISCELLANEOUS) ×1 IMPLANT
SYR 5ML LL (SYRINGE) ×3 IMPLANT
WATER STERILE IRR 250ML POUR (IV SOLUTION) ×3 IMPLANT
WIPE NON LINTING 3.25X3.25 (MISCELLANEOUS) ×3 IMPLANT

## 2016-12-06 NOTE — Transfer of Care (Signed)
Immediate Anesthesia Transfer of Care Note  Patient: Todd Olson  Procedure(s) Performed: Procedure(s) with comments: CATARACT EXTRACTION PHACO AND INTRAOCULAR LENS PLACEMENT (IOC) (Left) - [place] 00:31 AP% 15.6 CDE 4.99 Fluid pack lot # [id number]  Patient Location: Short Stay  Anesthesia Type:MAC  Level of Consciousness: awake, alert  and oriented  Airway & Oxygen Therapy: Patient Spontanous Breathing  Post-op Assessment: Post -op Vital signs reviewed and stable  Post vital signs: stable  Last Vitals:  Vitals:   12/06/16 0703 12/06/16 0812  BP: (!) 155/72 120/68  Pulse: 62 (!) 55  Resp: 16 12  Temp: 36.6 C 36.9 C    Last Pain:  Vitals:   12/06/16 0812  TempSrc: Oral         Complications: No apparent anesthesia complications

## 2016-12-06 NOTE — Anesthesia Postprocedure Evaluation (Signed)
Anesthesia Post Note  Patient: Todd Olson  Procedure(s) Performed: Procedure(s) (LRB): CATARACT EXTRACTION PHACO AND INTRAOCULAR LENS PLACEMENT (IOC) (Left)  Patient location during evaluation: Short Stay Anesthesia Type: MAC Level of consciousness: awake and alert Pain management: pain level controlled Vital Signs Assessment: post-procedure vital signs reviewed and stable Respiratory status: spontaneous breathing, nonlabored ventilation, respiratory function stable and patient connected to nasal cannula oxygen Cardiovascular status: stable and blood pressure returned to baseline Anesthetic complications: no     Last Vitals:  Vitals:   12/06/16 0703 12/06/16 0812  BP: (!) 155/72 120/68  Pulse: 62 (!) 55  Resp: 16 12  Temp: 36.6 C 36.9 C    Last Pain:  Vitals:   12/06/16 0812  TempSrc: Oral                 Estill Batten

## 2016-12-06 NOTE — Discharge Instructions (Signed)
Eye Surgery Discharge Instructions  Expect mild scratchy sensation or mild soreness. DO NOT RUB YOUR EYE!  The day of surgery:  Minimal physical activity, but bed rest is not required  No reading, computer work, or close hand work  No bending, lifting, or straining.  May watch TV  For 24 hours:  No driving, legal decisions, or alcoholic beverages  Safety precautions  Eat anything you prefer: It is better to start with liquids, then soup then solid foods.  _____ Eye patch should be worn until postoperative exam tomorrow.  ____ Solar shield eyeglasses should be worn for comfort in the sunlight/patch while sleeping  Resume all regular medications including aspirin or Coumadin if these were discontinued prior to surgery. You may shower, bathe, shave, or wash your hair. Tylenol may be taken for mild discomfort.  Call your doctor if you experience significant pain, nausea, or vomiting, fever > 101 or other signs of infection. [phone] or [phone] Specific instructions:  Follow-up Information    Birder Robson, MD Follow up.   Specialty:  Ophthalmology Why:  August 8 at 9:10am Contact information: [address] [place] [address] [phone]

## 2016-12-06 NOTE — Op Note (Signed)
PREOPERATIVE DIAGNOSIS:  Nuclear sclerotic cataract of the left eye.   POSTOPERATIVE DIAGNOSIS:  Nuclear sclerotic cataract of the left eye.   OPERATIVE PROCEDURE: Procedure(s): CATARACT EXTRACTION PHACO AND INTRAOCULAR LENS PLACEMENT (IOC)   SURGEON:  Birder Robson, MD.   ANESTHESIA:  Anesthesiologist: Piscitello, Precious Haws, MD CRNA: Aline Brochure, CRNA  1.      Managed anesthesia care. 2.     0.48ml of Shugarcaine was instilled following the paracentesis   COMPLICATIONS:  None.   TECHNIQUE:   Stop and chop   DESCRIPTION OF PROCEDURE:  The patient was examined and consented in the preoperative holding area where the aforementioned topical anesthesia was applied to the left eye and then brought back to the Operating Room where the left eye was prepped and draped in the usual sterile ophthalmic fashion and a lid speculum was placed. A paracentesis was created with the side port blade and the anterior chamber was filled with viscoelastic. A near clear corneal incision was performed with the steel keratome. A continuous curvilinear capsulorrhexis was performed with a cystotome followed by the capsulorrhexis forceps. Hydrodissection and hydrodelineation were carried out with BSS on a blunt cannula. The lens was removed in a stop and chop  technique and the remaining cortical material was removed with the irrigation-aspiration handpiece. The capsular bag was inflated with viscoelastic and the Technis [id number] lens was placed in the capsular bag without complication. The remaining viscoelastic was removed from the eye with the irrigation-aspiration handpiece. The wounds were hydrated. The anterior chamber was flushed with Miostat and the eye was inflated to physiologic pressure. 0.92ml Vigamox was placed in the anterior chamber. The wounds were found to be water tight. The eye was dressed with Vigamox. The patient was given protective glasses to wear throughout the day and a shield with which to  sleep tonight. The patient was also given drops with which to begin a drop regimen today and will follow-up with me in one day.  Implant Name Type Inv. Item Serial No. Manufacturer Lot No. LRB No. Used  LENS IOL DIOP 20.0 - [id number] Intraocular Lens LENS IOL DIOP 20.0 [id number]   Left 1    Procedure(s) with comments: CATARACT EXTRACTION PHACO AND INTRAOCULAR LENS PLACEMENT (IOC) (Left) - [place] 00:31 AP% 15.6 CDE 4.99 Fluid pack lot # [id number]  Electronically signed: [place] 12/06/2016 8:10 AM

## 2016-12-06 NOTE — H&P (Signed)
All labs reviewed. Abnormal studies sent to patients PCP when indicated.  Previous H&P reviewed, patient examined, there are NO CHANGES.  Todd Olson LOUIS8/7/20187:47 AM

## 2016-12-06 NOTE — Anesthesia Post-op Follow-up Note (Signed)
Anesthesia QCDR form completed.        

## 2016-12-06 NOTE — Anesthesia Preprocedure Evaluation (Signed)
Anesthesia Evaluation  Patient identified by MRN, date of birth, ID band Patient awake    Reviewed: Allergy & Precautions, H&P , NPO status , Patient's Chart, lab work & pertinent test results  History of Anesthesia Complications Negative for: history of anesthetic complications  Airway Mallampati: III  TM Distance: <3 FB Neck ROM: limited    Dental  (+) Poor Dentition, Chipped, Missing, Upper Dentures   Pulmonary neg shortness of breath, former smoker,           Cardiovascular Exercise Tolerance: Good hypertension, (-) angina+ CAD, + Past MI, + Cardiac Stents and +CHF  (-) DOE      Neuro/Psych negative neurological ROS  negative psych ROS   GI/Hepatic Neg liver ROS, hiatal hernia, Controlled,  Endo/Other  diabetes, Type 2  Renal/GU      Musculoskeletal  (+) Arthritis ,   Abdominal   Peds  Hematology negative hematology ROS (+)   Anesthesia Other Findings Past Medical History: No date: Anemia No date: CHF (congestive heart failure) (HCC) No date: Coronary artery disease No date: Diabetes mellitus without complication (HCC) No date: History of hiatal hernia No date: Hypertension No date: Myocardial infarction ([place])     Comment:  X 4 LAST 2003  Past Surgical History: No date: BACK SURGERY 2012: CHOLECYSTECTOMY     Comment:  Dr. Rochel Brome No date: CORONARY ANGIOPLASTY     Comment:  STENT PLACEMENT No date: FOOT SURGERY No date: HEMORROIDECTOMY No date: SHOULDER SURGERY  BMI    Body Mass Index:  30.41 kg/m      Reproductive/Obstetrics negative OB ROS                             Anesthesia Physical Anesthesia Plan  ASA: III  Anesthesia Plan: MAC   Post-op Pain Management:    Induction: Intravenous  PONV Risk Score and Plan:   Airway Management Planned: Natural Airway and Nasal Cannula  Additional Equipment:   Intra-op Plan:   Post-operative Plan:    Informed Consent: I have reviewed the patients History and Physical, chart, labs and discussed the procedure including the risks, benefits and alternatives for the proposed anesthesia with the patient or authorized representative who has indicated his/her understanding and acceptance.   Dental Advisory Given  Plan Discussed with: Anesthesiologist, CRNA and Surgeon  Anesthesia Plan Comments: (Patient consented for risks of anesthesia including but not limited to:  - adverse reactions to medications - damage to teeth, lips or other oral mucosa - sore throat or hoarseness - Damage to heart, brain, lungs or loss of life  Patient voiced understanding.)        Anesthesia Quick Evaluation

## 2016-12-21 ENCOUNTER — Encounter: Payer: Self-pay | Admitting: *Deleted

## 2016-12-24 ENCOUNTER — Other Ambulatory Visit: Payer: Self-pay | Admitting: Family Medicine

## 2016-12-27 ENCOUNTER — Encounter
Admission: RE | Disposition: A | Discharge status: Home / Self Care | Payer: Self-pay | Source: Ambulatory Visit | Attending: Ophthalmology

## 2016-12-27 ENCOUNTER — Encounter: Payer: Self-pay | Admitting: *Deleted

## 2016-12-27 ENCOUNTER — Ambulatory Visit
Admission: RE | Admit: 2016-12-27 | Discharge: 2016-12-27 | Disposition: A | Discharge status: Home / Self Care | Payer: Medicare PPO | Source: Ambulatory Visit | Attending: Ophthalmology | Admitting: Ophthalmology

## 2016-12-27 ENCOUNTER — Ambulatory Visit: Payer: Medicare PPO | Admitting: Anesthesiology

## 2016-12-27 DIAGNOSIS — Z7984 Long term (current) use of oral hypoglycemic drugs: Secondary | ICD-10-CM | POA: Diagnosis not present

## 2016-12-27 DIAGNOSIS — I252 Old myocardial infarction: Secondary | ICD-10-CM | POA: Diagnosis not present

## 2016-12-27 DIAGNOSIS — Z79899 Other long term (current) drug therapy: Secondary | ICD-10-CM | POA: Diagnosis not present

## 2016-12-27 DIAGNOSIS — I110 Hypertensive heart disease with heart failure: Secondary | ICD-10-CM | POA: Insufficient documentation

## 2016-12-27 DIAGNOSIS — Z87891 Personal history of nicotine dependence: Secondary | ICD-10-CM | POA: Insufficient documentation

## 2016-12-27 DIAGNOSIS — E1136 Type 2 diabetes mellitus with diabetic cataract: Secondary | ICD-10-CM | POA: Insufficient documentation

## 2016-12-27 DIAGNOSIS — I2510 Atherosclerotic heart disease of native coronary artery without angina pectoris: Secondary | ICD-10-CM | POA: Insufficient documentation

## 2016-12-27 DIAGNOSIS — D649 Anemia, unspecified: Secondary | ICD-10-CM | POA: Diagnosis not present

## 2016-12-27 DIAGNOSIS — Z955 Presence of coronary angioplasty implant and graft: Secondary | ICD-10-CM | POA: Diagnosis not present

## 2016-12-27 DIAGNOSIS — I509 Heart failure, unspecified: Secondary | ICD-10-CM | POA: Diagnosis not present

## 2016-12-27 HISTORY — PX: CATARACT EXTRACTION W/PHACO: SHX586

## 2016-12-27 LAB — GLUCOSE, CAPILLARY: Glucose-Capillary: 129 mg/dL — ABNORMAL HIGH (ref 65–99)

## 2016-12-27 SURGERY — PHACOEMULSIFICATION, CATARACT, WITH IOL INSERTION
Anesthesia: Monitor Anesthesia Care | Site: Eye | Laterality: Right | Wound class: Clean

## 2016-12-27 MED ORDER — POVIDONE-IODINE 5 % OP SOLN
OPHTHALMIC | Status: DC | PRN
  Administered 2016-12-27: 1 via OPHTHALMIC

## 2016-12-27 MED ORDER — BSS IO SOLN
INTRAOCULAR | Status: DC | PRN
  Administered 2016-12-27: 2 mL via OPHTHALMIC

## 2016-12-27 MED ORDER — POVIDONE-IODINE 5 % OP SOLN
OPHTHALMIC | Status: AC
  Filled 2016-12-27: qty 30

## 2016-12-27 MED ORDER — CARBACHOL 0.01 % IO SOLN
INTRAOCULAR | Status: DC | PRN
  Administered 2016-12-27: .5 mL via INTRAOCULAR

## 2016-12-27 MED ORDER — PROPOFOL 10 MG/ML IV BOLUS
INTRAVENOUS | Status: AC
  Filled 2016-12-27: qty 20

## 2016-12-27 MED ORDER — EPINEPHRINE PF 1 MG/ML IJ SOLN
INTRAMUSCULAR | Status: AC
  Filled 2016-12-27: qty 1

## 2016-12-27 MED ORDER — FENTANYL CITRATE (PF) 100 MCG/2ML IJ SOLN
INTRAMUSCULAR | Status: AC
  Filled 2016-12-27: qty 2

## 2016-12-27 MED ORDER — MOXIFLOXACIN HCL 0.5 % OP SOLN
OPHTHALMIC | Status: AC
  Filled 2016-12-27: qty 3

## 2016-12-27 MED ORDER — LIDOCAINE HCL (PF) 4 % IJ SOLN
INTRAMUSCULAR | Status: AC
  Filled 2016-12-27: qty 5

## 2016-12-27 MED ORDER — MOXIFLOXACIN HCL 0.5 % OP SOLN
OPHTHALMIC | Status: DC | PRN
  Administered 2016-12-27: .2 mL via OPHTHALMIC

## 2016-12-27 MED ORDER — SODIUM CHLORIDE 0.9 % IV SOLN
INTRAVENOUS | Status: DC
  Administered 2016-12-27: 06:00:00 via INTRAVENOUS

## 2016-12-27 MED ORDER — ARMC OPHTHALMIC DILATING DROPS
OPHTHALMIC | Status: AC
  Administered 2016-12-27: 1 via OPHTHALMIC
  Filled 2016-12-27: qty 0.4

## 2016-12-27 MED ORDER — MIDAZOLAM HCL 2 MG/2ML IJ SOLN
INTRAMUSCULAR | Status: DC | PRN
  Administered 2016-12-27: 1 mg via INTRAVENOUS

## 2016-12-27 MED ORDER — MIDAZOLAM HCL 2 MG/2ML IJ SOLN
INTRAMUSCULAR | Status: AC
  Filled 2016-12-27: qty 2

## 2016-12-27 MED ORDER — MOXIFLOXACIN HCL 0.5 % OP SOLN: 1.0000 [drp] | OPHTHALMIC | Status: DC | PRN

## 2016-12-27 MED ORDER — BSS IO SOLN
INTRAOCULAR | Status: DC | PRN
  Administered 2016-12-27: 1 mL via OPHTHALMIC

## 2016-12-27 MED ORDER — FENTANYL CITRATE (PF) 100 MCG/2ML IJ SOLN
INTRAMUSCULAR | Status: DC | PRN
  Administered 2016-12-27: 50 ug via INTRAVENOUS

## 2016-12-27 MED ORDER — NA CHONDROIT SULF-NA HYALURON 40-17 MG/ML IO SOLN
INTRAOCULAR | Status: DC | PRN
  Administered 2016-12-27: 1 mL via INTRAOCULAR

## 2016-12-27 MED ORDER — ARMC OPHTHALMIC DILATING DROPS
1.0000 "application " | OPHTHALMIC | Status: AC
  Administered 2016-12-27 (×3): 1 via OPHTHALMIC

## 2016-12-27 MED ORDER — NA CHONDROIT SULF-NA HYALURON 40-17 MG/ML IO SOLN
INTRAOCULAR | Status: AC
  Filled 2016-12-27: qty 1

## 2016-12-27 SURGICAL SUPPLY — 16 items
GLOVE BIO SURGEON STRL SZ8 (GLOVE) ×3 IMPLANT
GLOVE BIOGEL M 6.5 STRL (GLOVE) ×3 IMPLANT
GLOVE SURG LX 8.0 MICRO (GLOVE) ×2
GLOVE SURG LX STRL 8.0 MICRO (GLOVE) ×1 IMPLANT
GOWN STRL REUS W/ TWL LRG LVL3 (GOWN DISPOSABLE) ×2 IMPLANT
GOWN STRL REUS W/TWL LRG LVL3 (GOWN DISPOSABLE) ×4
LABEL CATARACT MEDS ST (LABEL) ×3 IMPLANT
LENS IOL TECNIS ITEC 19.5 (Intraocular Lens) ×3 IMPLANT
PACK CATARACT (MISCELLANEOUS) ×3 IMPLANT
PACK CATARACT BRASINGTON LX (MISCELLANEOUS) ×3 IMPLANT
PACK EYE AFTER SURG (MISCELLANEOUS) ×3 IMPLANT
SOL BSS BAG (MISCELLANEOUS) ×3
SOLUTION BSS BAG (MISCELLANEOUS) ×1 IMPLANT
SYR 5ML LL (SYRINGE) ×3 IMPLANT
WATER STERILE IRR 250ML POUR (IV SOLUTION) ×3 IMPLANT
WIPE NON LINTING 3.25X3.25 (MISCELLANEOUS) ×3 IMPLANT

## 2016-12-27 NOTE — Anesthesia Preprocedure Evaluation (Signed)
Anesthesia Evaluation  Patient identified by MRN, date of birth, ID band Patient awake    Reviewed: Allergy & Precautions, NPO status , Patient's Chart, lab work & pertinent test results  History of Anesthesia Complications Negative for: history of anesthetic complications  Airway Mallampati: III       Dental  (+) Edentulous Lower, Upper Dentures   Pulmonary neg sleep apnea, neg COPD, former smoker,           Cardiovascular hypertension, Pt. on home beta blockers and Pt. on medications + CAD, + Past MI, + Cardiac Stents and +CHF  (-) dysrhythmias (-) Valvular Problems/Murmurs     Neuro/Psych neg Seizures    GI/Hepatic Neg liver ROS, hiatal hernia,   Endo/Other  diabetes, Type 2, Oral Hypoglycemic Agents  Renal/GU negative Renal ROS     Musculoskeletal   Abdominal   Peds  Hematology  (+) anemia ,   Anesthesia Other Findings   Reproductive/Obstetrics                             Anesthesia Physical Anesthesia Plan  ASA: III  Anesthesia Plan: MAC   Post-op Pain Management:    Induction:   PONV Risk Score and Plan:   Airway Management Planned:   Additional Equipment:   Intra-op Plan:   Post-operative Plan:   Informed Consent: I have reviewed the patients History and Physical, chart, labs and discussed the procedure including the risks, benefits and alternatives for the proposed anesthesia with the patient or authorized representative who has indicated his/her understanding and acceptance.     Plan Discussed with:   Anesthesia Plan Comments:         Anesthesia Quick Evaluation

## 2016-12-27 NOTE — Anesthesia Procedure Notes (Signed)
Procedure Name: MAC Date/Time: 12/27/2016 7:25 AM Performed by: Hedda Slade Pre-anesthesia Checklist: Emergency Drugs available, Suction available, Patient being monitored and Patient identified Oxygen Delivery Method: Nasal cannula

## 2016-12-27 NOTE — Anesthesia Post-op Follow-up Note (Signed)
Anesthesia QCDR form completed.        

## 2016-12-27 NOTE — Anesthesia Postprocedure Evaluation (Signed)
Anesthesia Post Note  Patient: Todd Olson  Procedure(s) Performed: Procedure(s) (LRB): CATARACT EXTRACTION PHACO AND INTRAOCULAR LENS PLACEMENT (IOC) (Right)  Patient location during evaluation: PACU Anesthesia Type: MAC Level of consciousness: awake, awake and alert and oriented Pain management: pain level controlled Vital Signs Assessment: post-procedure vital signs reviewed and stable Respiratory status: spontaneous breathing Cardiovascular status: blood pressure returned to baseline Postop Assessment: no signs of nausea or vomiting Anesthetic complications: no     Last Vitals:  Vitals:   12/27/16 0608 12/27/16 0749  BP: (!) 127/55 (!) 108/53  Pulse: (!) 55 (!) 54  Resp: 16 12  Temp: (!) 36.3 C 36.7 C  SpO2: 97% 100%    Last Pain:  Vitals:   12/27/16 0749  TempSrc: Oral  PainSc:                  Hedda Slade

## 2016-12-27 NOTE — Discharge Instructions (Signed)

## 2016-12-27 NOTE — Op Note (Signed)
PREOPERATIVE DIAGNOSIS:  Nuclear sclerotic cataract of the right eye.   POSTOPERATIVE DIAGNOSIS:  nuclear sclerotic cataract right eye   OPERATIVE PROCEDURE: Procedure(s): CATARACT EXTRACTION PHACO AND INTRAOCULAR LENS PLACEMENT (IOC)   SURGEON:  Birder Robson, MD.   ANESTHESIA:  Anesthesiologist: Gunnar Fusi, MD CRNA: Hedda Slade, CRNA  1.      Managed anesthesia care. 2.      0.46ml of Shugarcaine was instilled in the eye following the paracentesis.   COMPLICATIONS:  None.   TECHNIQUE:   Stop and chop   DESCRIPTION OF PROCEDURE:  The patient was examined and consented in the preoperative holding area where the aforementioned topical anesthesia was applied to the right eye and then brought back to the Operating Room where the right eye was prepped and draped in the usual sterile ophthalmic fashion and a lid speculum was placed. A paracentesis was created with the side port blade and the anterior chamber was filled with viscoelastic. A near clear corneal incision was performed with the steel keratome. A continuous curvilinear capsulorrhexis was performed with a cystotome followed by the capsulorrhexis forceps. Hydrodissection and hydrodelineation were carried out with BSS on a blunt cannula. The lens was removed in a stop and chop  technique and the remaining cortical material was removed with the irrigation-aspiration handpiece. The capsular bag was inflated with viscoelastic and the Technis [id number]  lens was placed in the capsular bag without complication. The remaining viscoelastic was removed from the eye with the irrigation-aspiration handpiece. The wounds were hydrated. The anterior chamber was flushed with Miostat and the eye was inflated to physiologic pressure. 0.45ml of Vigamox was placed in the anterior chamber. The wounds were found to be water tight. The eye was dressed with Vigamox. The patient was given protective glasses to wear throughout the day and a shield with which  to sleep tonight. The patient was also given drops with which to begin a drop regimen today and will follow-up with me in one day.  Implant Name Type Inv. Item Serial No. Manufacturer Lot No. LRB No. Used  LENS IOL DIOP 19.5 - [id number] Intraocular Lens LENS IOL DIOP 19.5 [id number]   Right 1   Procedure(s) with comments: CATARACT EXTRACTION PHACO AND INTRAOCULAR LENS PLACEMENT (IOC) (Right) - [place] 00:46 AP% 12.5 CDE 5.79 Fluid pack lot # [id number]  Electronically signed: Miquel Lamson LOUIS 12/27/2016 7:45 AM

## 2016-12-27 NOTE — H&P (Signed)
  All labs reviewed. Abnormal studies sent to patients PCP when indicated.  Previous H&P reviewed, patient examined, there are NO CHANGES.  Adhya Cocco LOUIS8/28/20187:12 AM

## 2016-12-27 NOTE — Transfer of Care (Signed)
Immediate Anesthesia Transfer of Care Note  Patient: ALEN MATHESON  Procedure(s) Performed: Procedure(s) with comments: CATARACT EXTRACTION PHACO AND INTRAOCULAR LENS PLACEMENT (IOC) (Right) - [place] 00:46 AP% 12.5 CDE 5.79 Fluid pack lot # [id number]  Patient Location: PACU  Anesthesia Type:MAC  Level of Consciousness: awake, alert  and oriented  Airway & Oxygen Therapy: Patient Spontanous Breathing  Post-op Assessment: Report given to RN and Post -op Vital signs reviewed and stable  Post vital signs: Reviewed and stable  Last Vitals:  Vitals:   12/27/16 0608 12/27/16 0749  BP: (!) 127/55 (!) 108/53  Pulse: (!) 55 (!) 54  Resp: 16 12  Temp: (!) 36.3 C 36.7 C  SpO2: 97% 100%    Last Pain:  Vitals:   12/27/16 0749  TempSrc: Oral  PainSc:          Complications: No apparent anesthesia complications

## 2016-12-28 ENCOUNTER — Encounter: Payer: Self-pay | Admitting: Ophthalmology

## 2017-02-06 ENCOUNTER — Ambulatory Visit (INDEPENDENT_AMBULATORY_CARE_PROVIDER_SITE_OTHER): Payer: Medicare PPO | Admitting: Family Medicine

## 2017-02-06 VITALS — BP 110/62 | HR 60 | Temp 97.6°F | Resp 14 | Wt 208.0 lb

## 2017-02-06 DIAGNOSIS — I10 Essential (primary) hypertension: Secondary | ICD-10-CM

## 2017-02-06 DIAGNOSIS — Z23 Encounter for immunization: Secondary | ICD-10-CM | POA: Diagnosis not present

## 2017-02-06 DIAGNOSIS — E119 Type 2 diabetes mellitus without complications: Secondary | ICD-10-CM

## 2017-02-06 LAB — POCT GLYCOSYLATED HEMOGLOBIN (HGB A1C)
ESTIMATED AVERAGE GLUCOSE: 131
HEMOGLOBIN A1C: 6.2

## 2017-02-06 NOTE — Progress Notes (Signed)
Todd Olson  MRN: [id number] DOB: 03-28-1941  Subjective:  HPI   The patient is a 76 year old male who presents today for follow-up of chronic disease.  He was last seen on 10/10/16 and had labs done at that time.    Diabetes- The patient had A1C done on 10/10/16 and at that time it was 6.3.  He checks his glucose at home and gets readings 110-130.  He has not had any episodes of hypoglycemia other than one episode where he felt a little bad and his glucose was 90.  CAD/Edema- The patient has Lasix to take if his weight goes up 3 or more pounds in one day.   Patient states that he has continued with daily use of Lasix.  Hypertension- Patient on last visit was having mild orthostasis related hypotension. He checks his blood pressure at home and gets readings ranging 110/126 over 50's.    Since his last visit the patient has had bilateral cataract surgery.  He is still on eye drops but says that he has recovered well with no complications.  Patient Active Problem List   Diagnosis Date Noted  . Arteriosclerosis of nonautologous coronary artery bypass graft 09/10/2014  . Back pain, chronic 09/10/2014  . Diabetes mellitus, type 2 ([place]) 09/10/2014  . Well controlled diabetes mellitus ([place]) 09/10/2014  . Hemorrhoid 09/10/2014  . HLD (hyperlipidemia) 09/10/2014  . BP (high blood pressure) 09/10/2014  . Fatty tumor 09/10/2014  . Absolute anemia 09/10/2014  . Muscle ache 09/10/2014  . Arthritis of hand, degenerative 09/10/2014  . Benign neoplasm of colon 09/10/2014  . Rotator cuff syndrome 09/10/2014  . Head revolving around 09/10/2014  . Adynamia 09/10/2014    Past Medical History:  Diagnosis Date  . Anemia   . CHF (congestive heart failure) ([place])   . Coronary artery disease   . Diabetes mellitus without complication ([place])   . History of hiatal hernia   . Hypertension   . Myocardial infarction ([place])    X 4 LAST 2003    Social History   Social History  . Marital status:  Married    Spouse name: N/A  . Number of children: N/A  . Years of education: N/A   Occupational History  . Not on file.   Social History Main Topics  . Smoking status: Former Smoker    Quit date: 11/17/1977  . Smokeless tobacco: Former Systems developer    Quit date: 11/16/1977  . Alcohol use No  . Drug use: No  . Sexual activity: Not Currently   Other Topics Concern  . Not on file   Social History Narrative  . No narrative on file    Outpatient Encounter Prescriptions as of 02/06/2017  Medication Sig Note  . amLODipine (NORVASC) 5 MG tablet Take 1 tablet (5 mg total) by mouth daily.   Marland Kitchen aspirin 81 MG tablet Take by mouth. 09/10/2014: Received from: [facility]  . Cholecalciferol 1000 UNITS capsule Take by mouth. 09/10/2014: Received from: [facility]  . clopidogrel (PLAVIX) 75 MG tablet TAKE ONE TABLET BY MOUTH ONCE DAILY   . furosemide (LASIX) 40 MG tablet TAKE ONE TABLET BY MOUTH ONCE DAILY   . Glucosamine-Chondroit-Vit C-Mn (GLUCOSAMINE CHONDR 1500 COMPLX) CAPS Take by mouth. 09/10/2014: Received from: [facility]  . glucose blood test strip Check sugar once daily DX E11.9   . glucose blood test strip ONETOUCH ULTRA BLUE (In Vitro Strip)  Check sugar twice daily. DX E11.9   .  IRON, IRON, Take by mouth. 09/10/2014: Received from: [facility]  . lisinopril-hydrochlorothiazide (PRINZIDE,ZESTORETIC) 20-12.5 MG tablet Take 1 tablet by mouth daily.   Marland Kitchen lovastatin (MEVACOR) 40 MG tablet Take 1 tablet (40 mg total) by mouth at bedtime.   . magnesium oxide (MAG-OX) 400 (241.3 MG) MG tablet Take by mouth. 09/10/2014: generic ok Received from: [facility]  . metFORMIN (GLUCOPHAGE) 1000 MG tablet Take 1 tablet (1,000 mg total) by mouth 2 (two) times daily with a meal.   . metoprolol tartrate (LOPRESSOR) 25 MG tablet TAKE ONE TABLET BY MOUTH TWICE DAILY   . OMEGA-3 FATTY ACIDS PO Take by mouth. 09/10/2014: Received  from: [facility]  . pioglitazone (ACTOS) 45 MG tablet TAKE ONE TABLET BY MOUTH ONCE DAILY    No facility-administered encounter medications on file as of 02/06/2017.     Allergies  Allergen Reactions  . Oxycodone   . Penicillins Rash    Rash as a child.  Has not taken since then    Review of Systems  Constitutional: Negative for fever and malaise/fatigue.  HENT: Negative.   Eyes: Negative.        Much improved vision  since cataract surgery.  Respiratory: Negative for cough, shortness of breath and wheezing.   Cardiovascular: Negative for chest pain, palpitations, orthopnea, claudication and leg swelling.  Gastrointestinal: Negative.   Genitourinary: Negative for frequency.  Skin: Negative.   Neurological: Negative for weakness.  Endo/Heme/Allergies: Negative for polydipsia.  Psychiatric/Behavioral: Negative.     Objective:  BP 110/62 (BP Location: Right Arm, Patient Position: Sitting, Cuff Size: Normal)   Pulse 60   Temp 97.6 F (36.4 C) (Oral)   Resp 14   Wt 208 lb (94.3 kg)   BMI 31.63 kg/m   Physical Exam  Constitutional: He is oriented to person, place, and time and well-developed, well-nourished, and in no distress.  HENT:  Head: Normocephalic and atraumatic.  Right Ear: External ear normal.  Left Ear: External ear normal.  Nose: Nose normal.  Eyes: Pupils are equal, round, and reactive to light. Conjunctivae are normal. No scleral icterus.  Neck: Normal range of motion. No thyromegaly present.  Cardiovascular: Normal rate, regular rhythm and normal heart sounds.   Pulmonary/Chest: Effort normal and breath sounds normal.  Abdominal: Soft.  Musculoskeletal: He exhibits no edema.  Neurological: He is alert and oriented to person, place, and time. Gait normal. GCS score is 15.  Skin: Skin is warm and dry.  Psychiatric: Mood, memory and affect normal.    Assessment and Plan :   1. Essential hypertension   2. Type 2 diabetes mellitus  without complication, without long-term current use of insulin ([place]) Patient is doing well with glucose.  Will discontinue his Actos and see how he does.  If glucose goes up will go back on the medicine.  While off of the Actos the patient can go to every other day or prn on the Lasix.  - POCT glycosylated hemoglobin (Hb A1C)(6.2 today)  3. Influenza vaccine needed  - Flu vaccine HIGH DOSE PF (Fluzone High dose) 4.CAD All risk factors treated. 5.HTN 6,.HLD 7.Mild Edema Try Lasix other every other day and then maybe try to stop.  HPI, Exam and A&P Transcribed under the direction and in the presence of Miguel Aschoff, Brooke Bonito., MD. Electronically Signed: Althea Charon, RMA I have done the exam and reviewed the chart and it is accurate to the best of my knowledge. Development worker, community has been used and  any errors in dictation or transcription are unintentional. Miguel Aschoff M.D. [facility]

## 2017-02-23 ENCOUNTER — Other Ambulatory Visit: Payer: Self-pay | Admitting: Family Medicine

## 2017-06-06 ENCOUNTER — Ambulatory Visit: Payer: Self-pay

## 2017-06-07 ENCOUNTER — Ambulatory Visit (INDEPENDENT_AMBULATORY_CARE_PROVIDER_SITE_OTHER): Payer: Medicare PPO

## 2017-06-07 VITALS — BP 124/62 | HR 60 | Temp 98.2°F | Ht 67.0 in | Wt 210.8 lb

## 2017-06-07 DIAGNOSIS — Z0000 Encounter for general adult medical examination without abnormal findings: Secondary | ICD-10-CM | POA: Diagnosis not present

## 2017-06-07 NOTE — Patient Instructions (Signed)
[name] , Thank you for taking time to come for your Medicare Wellness Visit. I appreciate your ongoing commitment to your health goals. Please review the following plan we discussed and let me know if I can assist you in the future.   Screening recommendations/referrals: Colonoscopy: Up to date Recommended yearly ophthalmology/optometry visit for glaucoma screening and checkup Recommended yearly dental visit for hygiene and checkup  Vaccinations: Influenza vaccine: Up to date Pneumococcal vaccine: Up to date Tdap vaccine: Up to date Shingles vaccine: Pt declines today.     Advanced directives: Already on file.  Conditions/risks identified: Obesity- recommend to start exercising for 3 days a week for at least 30 minutes.   Next appointment: 06/13/17 @ 9:00 AM  Preventive Care 65 Years and Older, Male Preventive care refers to lifestyle choices and visits with your health care provider that can promote health and wellness. What does preventive care include?  A yearly physical exam. This is also called an annual well check.  Dental exams once or twice a year.  Routine eye exams. Ask your health care provider how often you should have your eyes checked.  Personal lifestyle choices, including:  Daily care of your teeth and gums.  Regular physical activity.  Eating a healthy diet.  Avoiding tobacco and drug use.  Limiting alcohol use.  Practicing safe sex.  Taking low doses of aspirin every day.  Taking vitamin and mineral supplements as recommended by your health care provider. What happens during an annual well check? The services and screenings done by your health care provider during your annual well check will depend on your age, overall health, lifestyle risk factors, and family history of disease. Counseling  Your health care provider may ask you questions about your:  Alcohol use.  Tobacco use.  Drug use.  Emotional well-being.  Home and relationship  well-being.  Sexual activity.  Eating habits.  History of falls.  Memory and ability to understand (cognition).  Work and work Statistician. Screening  You may have the following tests or measurements:  Height, weight, and BMI.  Blood pressure.  Lipid and cholesterol levels. These may be checked every 5 years, or more frequently if you are over 41 years old.  Skin check.  Lung cancer screening. You may have this screening every year starting at age 22 if you have a 30-pack-year history of smoking and currently smoke or have quit within the past 15 years.  Fecal occult blood test (FOBT) of the stool. You may have this test every year starting at age 64.  Flexible sigmoidoscopy or colonoscopy. You may have a sigmoidoscopy every 5 years or a colonoscopy every 10 years starting at age 3.  Prostate cancer screening. Recommendations will vary depending on your family history and other risks.  Hepatitis C blood test.  Hepatitis B blood test.  Sexually transmitted disease (STD) testing.  Diabetes screening. This is done by checking your blood sugar (glucose) after you have not eaten for a while (fasting). You may have this done every 1-3 years.  Abdominal aortic aneurysm (AAA) screening. You may need this if you are a current or former smoker.  Osteoporosis. You may be screened starting at age 59 if you are at high risk. Talk with your health care provider about your test results, treatment options, and if necessary, the need for more tests. Vaccines  Your health care provider may recommend certain vaccines, such as:  Influenza vaccine. This is recommended every year.  Tetanus, diphtheria, and acellular  pertussis (Tdap, Td) vaccine. You may need a Td booster every 10 years.  Zoster vaccine. You may need this after age 45.  Pneumococcal 13-valent conjugate (PCV13) vaccine. One dose is recommended after age 23.  Pneumococcal polysaccharide (PPSV23) vaccine. One dose is  recommended after age 37. Talk to your health care provider about which screenings and vaccines you need and how often you need them. This information is not intended to replace advice given to you by your health care provider. Make sure you discuss any questions you have with your health care provider. Document Released: 05/15/2015 Document Revised: 01/06/2016 Document Reviewed: 02/17/2015 Elsevier Interactive Patient Education  2017 [place] Prevention in the Home Falls can cause injuries. They can happen to people of all ages. There are many things you can do to make your home safe and to help prevent falls. What can I do on the outside of my home?  Regularly fix the edges of walkways and driveways and fix any cracks.  Remove anything that might make you trip as you walk through a door, such as a raised step or threshold.  Trim any bushes or trees on the path to your home.  Use bright outdoor lighting.  Clear any walking paths of anything that might make someone trip, such as rocks or tools.  Regularly check to see if handrails are loose or broken. Make sure that both sides of any steps have handrails.  Any raised decks and porches should have guardrails on the edges.  Have any leaves, snow, or ice cleared regularly.  Use sand or salt on walking paths during winter.  Clean up any spills in your garage right away. This includes oil or grease spills. What can I do in the bathroom?  Use night lights.  Install grab bars by the toilet and in the tub and shower. Do not use towel bars as grab bars.  Use non-skid mats or decals in the tub or shower.  If you need to sit down in the shower, use a plastic, non-slip stool.  Keep the floor dry. Clean up any water that spills on the floor as soon as it happens.  Remove soap buildup in the tub or shower regularly.  Attach bath mats securely with double-sided non-slip rug tape.  Do not have throw rugs and other things on  the floor that can make you trip. What can I do in the bedroom?  Use night lights.  Make sure that you have a light by your bed that is easy to reach.  Do not use any sheets or blankets that are too big for your bed. They should not hang down onto the floor.  Have a firm chair that has side arms. You can use this for support while you get dressed.  Do not have throw rugs and other things on the floor that can make you trip. What can I do in the kitchen?  Clean up any spills right away.  Avoid walking on wet floors.  Keep items that you use a lot in easy-to-reach places.  If you need to reach something above you, use a strong step stool that has a grab bar.  Keep electrical cords out of the way.  Do not use floor polish or wax that makes floors slippery. If you must use wax, use non-skid floor wax.  Do not have throw rugs and other things on the floor that can make you trip. What can I do with my stairs?  Do not leave any items on the stairs.  Make sure that there are handrails on both sides of the stairs and use them. Fix handrails that are broken or loose. Make sure that handrails are as long as the stairways.  Check any carpeting to make sure that it is firmly attached to the stairs. Fix any carpet that is loose or worn.  Avoid having throw rugs at the top or bottom of the stairs. If you do have throw rugs, attach them to the floor with carpet tape.  Make sure that you have a light switch at the top of the stairs and the bottom of the stairs. If you do not have them, ask someone to add them for you. What else can I do to help prevent falls?  Wear shoes that:  Do not have high heels.  Have rubber bottoms.  Are comfortable and fit you well.  Are closed at the toe. Do not wear sandals.  If you use a stepladder:  Make sure that it is fully opened. Do not climb a closed stepladder.  Make sure that both sides of the stepladder are locked into place.  Ask someone to  hold it for you, if possible.  Clearly mark and make sure that you can see:  Any grab bars or handrails.  First and last steps.  Where the edge of each step is.  Use tools that help you move around (mobility aids) if they are needed. These include:  Canes.  Walkers.  Scooters.  Crutches.  Turn on the lights when you go into a dark area. Replace any light bulbs as soon as they burn out.  Set up your furniture so you have a clear path. Avoid moving your furniture around.  If any of your floors are uneven, fix them.  If there are any pets around you, be aware of where they are.  Review your medicines with your doctor. Some medicines can make you feel dizzy. This can increase your chance of falling. Ask your doctor what other things that you can do to help prevent falls. This information is not intended to replace advice given to you by your health care provider. Make sure you discuss any questions you have with your health care provider. Document Released: 02/12/2009 Document Revised: 09/24/2015 Document Reviewed: 05/23/2014 Elsevier Interactive Patient Education  2017 [employer].

## 2017-06-07 NOTE — Progress Notes (Signed)
Subjective:   Todd Olson is a 77 y.o. male who presents for Medicare Annual/Subsequent preventive examination.  Review of Systems:  N/A  Cardiac Risk Factors include: advanced age (>48men, >67 women);diabetes mellitus;dyslipidemia;hypertension;male gender;obesity (BMI >30kg/m2)     Objective:    Vitals: BP 124/62 (BP Location: Right Arm)   Pulse 60   Temp 98.2 F (36.8 C) (Oral)   Ht 5\' 7"  (1.702 m)   Wt 210 lb 12.8 oz (95.6 kg)   BMI 33.02 kg/m   Body mass index is 33.02 kg/m.  Advanced Directives 06/07/2017 12/27/2016 12/06/2016 05/31/2016 08/03/2015 05/28/2015  Does Patient Have a Medical Advance Directive? Yes Yes Yes Yes No No  Type of Paramedic of [place];Living will [place];Living will [place];Living will - - -  Does patient want to make changes to medical advance directive? - No - Patient declined No - Patient declined - - -  Copy of [place] in Chart? Yes Yes Yes - - -    Tobacco Social History   Tobacco Use  Smoking Status Former Smoker  . Last attempt to quit: 11/17/1977  . Years since quitting: 39.5  Smokeless Tobacco Never Used     Counseling given: Not Answered   Clinical Intake:  Pre-visit preparation completed: Yes  Pain : No/denies pain Pain Score: 0-No pain     Nutritional Status: BMI > 30  Obese Nutritional Risks: None Diabetes: Yes(type 2) CBG done?: No Did pt. bring in CBG monitor from home?: No  How often do you need to have someone help you when you read instructions, pamphlets, or other written materials from your doctor or pharmacy?: 1 - Never  Interpreter Needed?: No  Information entered by :: [facility], LPN  Past Medical History:  Diagnosis Date  . Anemia   . CHF (congestive heart failure) ([place])   . Coronary artery disease   . Diabetes mellitus without complication ([place])   . History of hiatal hernia   . Hypertension   . Myocardial  infarction ([place])    X 4 LAST 2003   Past Surgical History:  Procedure Laterality Date  . BACK SURGERY    . CATARACT EXTRACTION W/PHACO Left 12/06/2016   Procedure: CATARACT EXTRACTION PHACO AND INTRAOCULAR LENS PLACEMENT (IOC);  Surgeon: Birder Robson, MD;  Location: [facility] ORS;  Service: Ophthalmology;  Laterality: Left;  [place] 00:31 AP% 15.6 CDE 4.99 Fluid pack lot # [id number]  . CATARACT EXTRACTION W/PHACO Right 12/27/2016   Procedure: CATARACT EXTRACTION PHACO AND INTRAOCULAR LENS PLACEMENT (IOC);  Surgeon: Birder Robson, MD;  Location: [facility] ORS;  Service: Ophthalmology;  Laterality: Right;  [place] 00:46 AP% 12.5 CDE 5.79 Fluid pack lot # [id number]  . CHOLECYSTECTOMY  2012   Dr. Rochel Brome  . CORONARY ANGIOPLASTY     STENT PLACEMENT  . FOOT SURGERY    . HEMORROIDECTOMY    . SHOULDER SURGERY     Family History  Problem Relation Age of Onset  . Heart disease Mother   . Heart disease Father   . Hypertension Father   . Bipolar disorder Sister        with hospitalizations  . Diabetes Brother   . Hypertension Brother   . Heart disease Brother   . Diabetes Brother   . Heart disease Brother        has stents   Social History   Socioeconomic History  . Marital status: Married    Spouse name: None  .  Number of children: 3  . Years of education: None  . Highest education level: Associate degree: occupational, Hotel manager, or vocational program  Social Needs  . Financial resource strain: Not hard at all  . Food insecurity - worry: Never true  . Food insecurity - inability: Never true  . Transportation needs - medical: No  . Transportation needs - non-medical: No  Occupational History  . Occupation: retired  Tobacco Use  . Smoking status: Former Smoker    Last attempt to quit: 11/17/1977    Years since quitting: 39.5  . Smokeless tobacco: Never Used  Substance and Sexual Activity  . Alcohol use: No    Alcohol/week: 0.0 oz  . Drug use: No  . Sexual activity: Not  Currently  Other Topics Concern  . None  Social History Narrative  . None    Outpatient Encounter Medications as of 06/07/2017  Medication Sig  . amLODipine (NORVASC) 5 MG tablet Take 1 tablet (5 mg total) by mouth daily.  Marland Kitchen aspirin 81 MG tablet Take 81 mg by mouth daily.   . Cholecalciferol 1000 UNITS capsule Take 1,000 Units by mouth daily.   . clopidogrel (PLAVIX) 75 MG tablet TAKE ONE TABLET BY MOUTH ONCE DAILY  . furosemide (LASIX) 40 MG tablet TAKE ONE TABLET BY MOUTH ONCE DAILY  . Glucosamine-Chondroit-Vit C-Mn (GLUCOSAMINE CHONDR 1500 COMPLX) CAPS Take by mouth.  Marland Kitchen glucose blood test strip ONETOUCH ULTRA BLUE (In Vitro Strip)  Check sugar twice daily. DX E11.9  . IRON, IRON, Take 65 mg by mouth daily.   Marland Kitchen lisinopril-hydrochlorothiazide (PRINZIDE,ZESTORETIC) 20-12.5 MG tablet Take 1 tablet by mouth daily.  Marland Kitchen lovastatin (MEVACOR) 40 MG tablet TAKE ONE TABLET BY MOUTH AT BEDTIME  . magnesium oxide (MAG-OX) 400 (241.3 MG) MG tablet Take by mouth.  . metFORMIN (GLUCOPHAGE) 1000 MG tablet Take 1 tablet (1,000 mg total) by mouth 2 (two) times daily with a meal.  . metoprolol tartrate (LOPRESSOR) 25 MG tablet TAKE ONE TABLET BY MOUTH TWICE DAILY  . OMEGA-3 FATTY ACIDS PO Take 1,000 mg by mouth daily.   . pioglitazone (ACTOS) 45 MG tablet Take 45 mg by mouth daily.  . [DISCONTINUED] glucose blood test strip Check sugar once daily DX E11.9   No facility-administered encounter medications on file as of 06/07/2017.     Activities of Daily Living In your present state of health, do you have any difficulty performing the following activities: 06/07/2017  Hearing? Y  Comment Has trouble due to ringing, declines seeing an audiologist  Vision? Y  Comment Had cataracts removed in 2018, pt needs an updated pair of eyeglasses.   Difficulty concentrating or making decisions? N  Walking or climbing stairs? N  Dressing or bathing? N  Doing errands, shopping? N  Preparing Food and eating ? N  Using  the Toilet? N  In the past six months, have you accidently leaked urine? N  Do you have problems with loss of bowel control? N  Managing your Medications? N  Managing your Finances? N  Housekeeping or managing your Housekeeping? N  Some recent data might be hidden    Patient Care Team: Jerrol Banana., MD as PCP - General (Family Medicine) Isaias Cowman, MD as Consulting Physician (Cardiology) Birder Robson, MD as Referring Physician (Ophthalmology)   Assessment:   This is a routine wellness examination for [place].  Exercise Activities and Dietary recommendations Current Exercise Habits: The patient does not participate in regular exercise at present, Exercise limited by: Other -  see comments(Due to issues at home, has been busy.)  Goals    . Exercise 3x per week (30 min per time)     Recommend to start exercising for 3 days a week for at least 30 minutes.        Fall Risk Fall Risk  06/07/2017 05/31/2016 05/28/2015  Falls in the past year? No No No   Is the patient's home free of loose throw rugs in walkways, pet beds, electrical cords, etc?   yes      Grab bars in the bathroom? yes      Handrails on the stairs?   yes      Adequate lighting?   yes  Timed Get Up and Go Performed: N/A  Depression Screen PHQ 2/9 Scores 06/07/2017 05/31/2016 05/28/2015  PHQ - 2 Score 0 0 0    Cognitive Function     6CIT Screen 06/07/2017 05/31/2016  What Year? 0 points 0 points  What month? 0 points 0 points  What time? 0 points 0 points  Count back from 20 0 points 0 points  Months in reverse 0 points 0 points  Repeat phrase 0 points 0 points  Total Score 0 0    Immunization History  Administered Date(s) Administered  . Influenza, High Dose Seasonal PF 01/26/2015, 02/01/2016, 02/06/2017  . Pneumococcal Conjugate-13 11/14/2013  . Pneumococcal Polysaccharide-23 10/17/2011, 07/08/2014  . Td 05/31/2016    Qualifies for Shingles Vaccine? Pt declines today.   Screening  Tests Health Maintenance  Topic Date Due  . FOOT EXAM  05/31/2017  . HEMOGLOBIN A1C  08/07/2017  . OPHTHALMOLOGY EXAM  10/27/2017  . COLONOSCOPY  08/06/2018  . TETANUS/TDAP  05/31/2026  . INFLUENZA VACCINE  Completed  . PNA vac Low Risk Adult  Completed   Cancer Screenings: Lung: Low Dose CT Chest recommended if Age 34-80 years, 30 pack-year currently smoking OR have quit w/in 15years. Patient does not qualify. Colorectal: Up to date  Additional Screenings:  Hepatitis B/HIV/Syphillis: Pt declines today.  Hepatitis C Screening: Pt declines today.     Plan:  I have personally reviewed and addressed the Medicare Annual Wellness questionnaire and have noted the following in the patient's chart:  A. Medical and social history B. Use of alcohol, tobacco or illicit drugs  C. Current medications and supplements D. Functional ability and status E.  Nutritional status F.  Physical activity G. Advance directives H. List of other physicians I.  Hospitalizations, surgeries, and ER visits in previous 12 months J.  [place] such as hearing and vision if needed, cognitive and depression L. Referrals and appointments - none  In addition, I have reviewed and discussed with patient certain preventive protocols, quality metrics, and best practice recommendations. A written personalized care plan for preventive services as well as general preventive health recommendations were provided to patient.  See attached scanned questionnaire for additional information.   Signed,  Fabio Neighbors, LPN Nurse Health Advisor   Nurse Recommendations:

## 2017-06-13 ENCOUNTER — Ambulatory Visit (INDEPENDENT_AMBULATORY_CARE_PROVIDER_SITE_OTHER): Payer: Medicare PPO | Admitting: Family Medicine

## 2017-06-13 ENCOUNTER — Encounter: Payer: Self-pay | Admitting: Family Medicine

## 2017-06-13 VITALS — BP 124/60 | HR 62 | Temp 97.7°F | Resp 16 | Ht 67.0 in | Wt 211.0 lb

## 2017-06-13 DIAGNOSIS — M25511 Pain in right shoulder: Secondary | ICD-10-CM

## 2017-06-13 DIAGNOSIS — E7800 Pure hypercholesterolemia, unspecified: Secondary | ICD-10-CM

## 2017-06-13 DIAGNOSIS — I10 Essential (primary) hypertension: Secondary | ICD-10-CM | POA: Diagnosis not present

## 2017-06-13 DIAGNOSIS — E119 Type 2 diabetes mellitus without complications: Secondary | ICD-10-CM

## 2017-06-13 DIAGNOSIS — Z1211 Encounter for screening for malignant neoplasm of colon: Secondary | ICD-10-CM

## 2017-06-13 DIAGNOSIS — Z0000 Encounter for general adult medical examination without abnormal findings: Secondary | ICD-10-CM

## 2017-06-13 LAB — IFOBT (OCCULT BLOOD): IFOBT: NEGATIVE

## 2017-06-13 MED ORDER — PIOGLITAZONE HCL 30 MG PO TABS: 45.0000 mg | ORAL_TABLET | Freq: Every day | ORAL | 3 refills | Status: DC

## 2017-06-13 NOTE — Progress Notes (Signed)
Patient: Todd Olson, Male    DOB: Apr 28, 1941, 77 y.o.   MRN: [id number] Visit Date: 06/13/2017  Today's Provider: [name], MD   Chief Complaint  Patient presents with  . Annual Exam   Subjective:    Annual physical exam Todd Olson is a 77 y.o. male who presents today for health maintenance and complete physical. He feels well. He reports he is not exercising, but has his exercise machines up and running and plans to start back. He reports he is sleeping well.  ----------------------------------------------------------------- Colonoscopy- 08/05/08 1 polyp, internal hemorrhoids, otherwise normal.    Review of Systems  Constitutional: Negative.   HENT: Positive for tinnitus.   Eyes: Positive for visual disturbance.  Respiratory: Negative.   Cardiovascular: Negative.   Gastrointestinal: Negative.   Endocrine: Negative.   Genitourinary: Negative.   Musculoskeletal: Positive for arthralgias.       Ongoing right shoulder pain with abduction.  Skin: Negative.   Allergic/Immunologic: Negative.   Neurological: Negative.   Hematological: Negative.   Psychiatric/Behavioral: Negative.     Social History      He  reports that he quit smoking about 39 years ago. he has never used smokeless tobacco. He reports that he does not drink alcohol or use drugs.       Social History   Socioeconomic History  . Marital status: Married    Spouse name: None  . Number of children: 3  . Years of education: None  . Highest education level: Associate degree: occupational, Hotel manager, or vocational program  Social Needs  . Financial resource strain: Not hard at all  . Food insecurity - worry: Never true  . Food insecurity - inability: Never true  . Transportation needs - medical: No  . Transportation needs - non-medical: No  Occupational History  . Occupation: retired  Tobacco Use  . Smoking status: Former Smoker    Last attempt to quit: 11/17/1977    Years since  quitting: 39.5  . Smokeless tobacco: Never Used  Substance and Sexual Activity  . Alcohol use: No    Alcohol/week: 0.0 oz  . Drug use: No  . Sexual activity: Not Currently  Other Topics Concern  . None  Social History Narrative  . None    Past Medical History:  Diagnosis Date  . Anemia   . CHF (congestive heart failure) ([place])   . Coronary artery disease   . Diabetes mellitus without complication ([place])   . History of hiatal hernia   . Hypertension   . Myocardial infarction ([place])    X 4 LAST 2003     Patient Active Problem List   Diagnosis Date Noted  . Arteriosclerosis of nonautologous coronary artery bypass graft 09/10/2014  . Back pain, chronic 09/10/2014  . Diabetes mellitus, type 2 ([place]) 09/10/2014  . Well controlled diabetes mellitus ([place]) 09/10/2014  . Hemorrhoid 09/10/2014  . HLD (hyperlipidemia) 09/10/2014  . BP (high blood pressure) 09/10/2014  . Fatty tumor 09/10/2014  . Absolute anemia 09/10/2014  . Muscle ache 09/10/2014  . Arthritis of hand, degenerative 09/10/2014  . Benign neoplasm of colon 09/10/2014  . Rotator cuff syndrome 09/10/2014  . Head revolving around 09/10/2014  . Adynamia 09/10/2014    Past Surgical History:  Procedure Laterality Date  . BACK SURGERY    . CATARACT EXTRACTION W/PHACO Left 12/06/2016   Procedure: CATARACT EXTRACTION PHACO AND INTRAOCULAR LENS PLACEMENT (IOC);  Surgeon: Birder Robson, MD;  Location: [facility] ORS;  Service: Ophthalmology;  Laterality: Left;  [place] 00:31 AP% 15.6 CDE 4.99 Fluid pack lot # [id number]  . CATARACT EXTRACTION W/PHACO Right 12/27/2016   Procedure: CATARACT EXTRACTION PHACO AND INTRAOCULAR LENS PLACEMENT (IOC);  Surgeon: Birder Robson, MD;  Location: [facility] ORS;  Service: Ophthalmology;  Laterality: Right;  [place] 00:46 AP% 12.5 CDE 5.79 Fluid pack lot # [id number]  . CHOLECYSTECTOMY  2012   Dr. Rochel Brome  . CORONARY ANGIOPLASTY     STENT PLACEMENT  . FOOT SURGERY    . HEMORROIDECTOMY    .  SHOULDER SURGERY      Family History        Family Status  Relation Name Status  . Mother  Deceased at age 57       Cause of death was complications with Alzheimer's and parkinson's dz  . Father  Deceased at age 15       cause of death was MI  . Sister  Alive  . Brother  Deceased at age 9       Heart disease, Died while waiting for heart transplant  . Brother  Deceased        His family history includes Bipolar disorder in his sister; Diabetes in his brother and brother; Heart disease in his brother, brother, father, and mother; Hypertension in his brother and father.      Allergies  Allergen Reactions  . Oxycodone   . Penicillins Rash    Rash as a child.  Has not taken since then     Current Outpatient Medications:  .  amLODipine (NORVASC) 5 MG tablet, Take 1 tablet (5 mg total) by mouth daily., Disp: 30 tablet, Rfl: 12 .  aspirin 81 MG tablet, Take 81 mg by mouth daily. , Disp: , Rfl:  .  Cholecalciferol 1000 UNITS capsule, Take 1,000 Units by mouth daily. , Disp: , Rfl:  .  clopidogrel (PLAVIX) 75 MG tablet, TAKE ONE TABLET BY MOUTH ONCE DAILY, Disp: 30 tablet, Rfl: 12 .  furosemide (LASIX) 40 MG tablet, TAKE ONE TABLET BY MOUTH ONCE DAILY, Disp: 90 tablet, Rfl: 3 .  Glucosamine-Chondroit-Vit C-Mn (GLUCOSAMINE CHONDR 1500 COMPLX) CAPS, Take by mouth., Disp: , Rfl:  .  glucose blood test strip, ONETOUCH ULTRA BLUE (In Vitro Strip)  Check sugar twice daily. DX E11.9, Disp: 100 each, Rfl: 12 .  IRON, IRON,, Take 65 mg by mouth daily. , Disp: , Rfl:  .  lisinopril-hydrochlorothiazide (PRINZIDE,ZESTORETIC) 20-12.5 MG tablet, Take 1 tablet by mouth daily., Disp: 90 tablet, Rfl: 3 .  lovastatin (MEVACOR) 40 MG tablet, TAKE ONE TABLET BY MOUTH AT BEDTIME, Disp: 90 tablet, Rfl: 3 .  magnesium oxide (MAG-OX) 400 (241.3 MG) MG tablet, Take by mouth., Disp: , Rfl:  .  metFORMIN (GLUCOPHAGE) 1000 MG tablet, Take 1 tablet (1,000 mg total) by mouth 2 (two) times daily with a meal., Disp:  180 tablet, Rfl: 3 .  metoprolol tartrate (LOPRESSOR) 25 MG tablet, TAKE ONE TABLET BY MOUTH TWICE DAILY, Disp: 180 tablet, Rfl: 3 .  OMEGA-3 FATTY ACIDS PO, Take 1,000 mg by mouth daily. , Disp: , Rfl:  .  pioglitazone (ACTOS) 45 MG tablet, Take 45 mg by mouth daily., Disp: , Rfl:    Patient Care Team: Jerrol Banana., MD as PCP - General (Family Medicine) Isaias Cowman, MD as Consulting Physician (Cardiology) Birder Robson, MD as Referring Physician (Ophthalmology)      Objective:   Vitals: BP 124/60 (BP Location: Left Arm, Patient Position: Sitting,  Cuff Size: Large)   Pulse 62   Temp 97.7 F (36.5 C) (Oral)   Resp 16   Ht 5\' 7"  (1.702 m)   Wt 211 lb (95.7 kg)   BMI 33.05 kg/m    Vitals:   06/13/17 0902  BP: 124/60  Pulse: 62  Resp: 16  Temp: 97.7 F (36.5 C)  TempSrc: Oral  Weight: 211 lb (95.7 kg)  Height: 5\' 7"  (1.702 m)     Physical Exam  Constitutional: He is oriented to person, place, and time. He appears well-developed and well-nourished.  HENT:  Head: Normocephalic and atraumatic.  Right Ear: External ear normal.  Left Ear: External ear normal.  Nose: Nose normal.  Mouth/Throat: Oropharynx is clear and moist.  Eyes: Conjunctivae are normal. No scleral icterus.  Neck: No thyromegaly present.  Cardiovascular: Normal rate, regular rhythm, normal heart sounds and intact distal pulses.  Pulmonary/Chest: Effort normal and breath sounds normal.  Abdominal: Soft.  Genitourinary: Rectum normal, prostate normal and penis normal.  Neurological: He is alert and oriented to person, place, and time.  Skin: Skin is warm and dry.  Psychiatric: He has a normal mood and affect. His behavior is normal. Judgment and thought content normal.     Depression Screen PHQ 2/9 Scores 06/07/2017 06/07/2017 05/31/2016 05/28/2015  PHQ - 2 Score 0 0 0 0  PHQ- 9 Score 0 - - -      Assessment & Plan:     Routine Health Maintenance and Physical Exam  Exercise  Activities and Dietary recommendations Goals    . Exercise 3x per week (30 min per time)     Recommend to start exercising for 3 days a week for at least 30 minutes.        Immunization History  Administered Date(s) Administered  . Influenza, High Dose Seasonal PF 01/26/2015, 02/01/2016, 02/06/2017  . Pneumococcal Conjugate-13 11/14/2013  . Pneumococcal Polysaccharide-23 10/17/2011, 07/08/2014  . Td 05/31/2016    Health Maintenance  Topic Date Due  . FOOT EXAM  05/31/2017  . HEMOGLOBIN A1C  08/07/2017  . OPHTHALMOLOGY EXAM  10/27/2017  . COLONOSCOPY  08/06/2018  . TETANUS/TDAP  05/31/2026  . INFLUENZA VACCINE  Completed  . PNA vac Low Risk Adult  Completed     Discussed health benefits of physical activity, and encouraged him to engage in regular exercise appropriate for his age and condition.  Right shoulder arthropathy Refer to Ortho. CAD TIIDM--Decrease Actos to 30mg  daily. HTN HLD   -------------------------------------------------------------------- I have done the exam and reviewed the chart and it is accurate to the best of my knowledge. Development worker, community has been used and  any errors in dictation or transcription are unintentional. Miguel Aschoff M.D. [place], MD  [facility]

## 2017-06-14 ENCOUNTER — Telehealth: Payer: Self-pay | Admitting: Emergency Medicine

## 2017-06-14 DIAGNOSIS — E119 Type 2 diabetes mellitus without complications: Secondary | ICD-10-CM

## 2017-06-14 LAB — LIPID PANEL
CHOL/HDL RATIO: 3.2 ratio (ref 0.0–5.0)
CHOLESTEROL TOTAL: 162 mg/dL (ref 100–199)
HDL: 51 mg/dL (ref >39)
LDL Calculated: 72 mg/dL (ref 0–99)
TRIGLYCERIDES: 195 mg/dL — AB (ref 0–149)
VLDL CHOLESTEROL CAL: 39 mg/dL (ref 5–40)

## 2017-06-14 LAB — HEMOGLOBIN A1C
Est. average glucose Bld gHb Est-mCnc: 137 mg/dL
Hgb A1c MFr Bld: 6.4 % — ABNORMAL HIGH (ref 4.8–5.6)

## 2017-06-14 LAB — TSH: TSH: 1.52 u[IU]/mL (ref 0.450–4.500)

## 2017-06-14 LAB — CBC WITH DIFFERENTIAL/PLATELET
BASOS: 0 %
Basophils Absolute: 0 10*3/uL (ref 0.0–0.2)
EOS (ABSOLUTE): 0.2 10*3/uL (ref 0.0–0.4)
EOS: 4 %
HEMATOCRIT: 44 % (ref 37.5–51.0)
Hemoglobin: 14.5 g/dL (ref 13.0–17.7)
IMMATURE GRANS (ABS): 0 10*3/uL (ref 0.0–0.1)
IMMATURE GRANULOCYTES: 0 %
LYMPHS: 19 %
Lymphocytes Absolute: 1.1 10*3/uL (ref 0.7–3.1)
MCH: 29.7 pg (ref 26.6–33.0)
MCHC: 33 g/dL (ref 31.5–35.7)
MCV: 90 fL (ref 79–97)
MONOS ABS: 0.4 10*3/uL (ref 0.1–0.9)
Monocytes: 8 %
NEUTROS ABS: 4 10*3/uL (ref 1.4–7.0)
NEUTROS PCT: 69 %
Platelets: 183 10*3/uL (ref 150–379)
RBC: 4.89 x10E6/uL (ref 4.14–5.80)
RDW: 14.4 % (ref 12.3–15.4)
WBC: 5.7 10*3/uL (ref 3.4–10.8)

## 2017-06-14 MED ORDER — PIOGLITAZONE HCL 30 MG PO TABS: 30.0000 mg | ORAL_TABLET | Freq: Every day | ORAL | 3 refills | Status: DC

## 2017-06-14 NOTE — Telephone Encounter (Signed)
LMTCB

## 2017-06-14 NOTE — Telephone Encounter (Signed)
-----   Message from Jerrol Banana., MD sent at 06/14/2017  8:35 AM EST ----- Labs ok.

## 2017-06-14 NOTE — Telephone Encounter (Signed)
Pt states he is returning your call.  States he has an appt and will be leaving his house at 245.

## 2017-06-14 NOTE — Telephone Encounter (Signed)
Verified with with pt that his Actos is suppose to be 30 mg daily.

## 2017-06-14 NOTE — Telephone Encounter (Signed)
Patient has a question regarding Actos, pt reports sig: is take 1.5 tablet (45mg ) and reports that Dr. Rosanna Randy said to decrease to 30 mg. Please advise. sd

## 2017-07-04 LAB — HM DIABETES EYE EXAM

## 2017-07-25 ENCOUNTER — Other Ambulatory Visit: Payer: Self-pay | Admitting: Family Medicine

## 2017-07-30 ENCOUNTER — Other Ambulatory Visit: Payer: Self-pay | Admitting: Family Medicine

## 2017-08-08 ENCOUNTER — Encounter: Payer: Self-pay | Admitting: Family Medicine

## 2017-08-08 ENCOUNTER — Ambulatory Visit (INDEPENDENT_AMBULATORY_CARE_PROVIDER_SITE_OTHER): Payer: Medicare PPO | Admitting: Family Medicine

## 2017-08-08 ENCOUNTER — Other Ambulatory Visit: Payer: Self-pay | Admitting: Orthopedic Surgery

## 2017-08-08 ENCOUNTER — Telehealth: Payer: Self-pay | Admitting: Family Medicine

## 2017-08-08 VITALS — BP 128/70 | HR 72 | Temp 98.4°F | Resp 16 | Wt 208.0 lb

## 2017-08-08 DIAGNOSIS — E119 Type 2 diabetes mellitus without complications: Secondary | ICD-10-CM

## 2017-08-08 DIAGNOSIS — Z01818 Encounter for other preprocedural examination: Secondary | ICD-10-CM | POA: Diagnosis not present

## 2017-08-08 DIAGNOSIS — I25810 Atherosclerosis of coronary artery bypass graft(s) without angina pectoris: Secondary | ICD-10-CM | POA: Diagnosis not present

## 2017-08-08 LAB — POCT URINALYSIS DIPSTICK
Bilirubin, UA: NEGATIVE
Blood, UA: NEGATIVE
Glucose, UA: NEGATIVE
Ketones, UA: NEGATIVE
Leukocytes, UA: NEGATIVE
NITRITE UA: NEGATIVE
PH UA: 6 (ref 5.0–8.0)
Protein, UA: NEGATIVE
Spec Grav, UA: 1.02 (ref 1.010–1.025)
UROBILINOGEN UA: 0.2 U/dL

## 2017-08-08 NOTE — Progress Notes (Signed)
Patient: Todd Olson Male    DOB: 1941-02-21   77 y.o.   MRN: [id number] Visit Date: 08/08/2017  Today's Provider: [name], MD   Chief Complaint  Patient presents with  . Pre-op Exam   Subjective:    HPI Patient here today for surgical clearance. Patient is having done surgery,Right shoulder Orthroscopy and biceps Tenotomy.  Lab Results  Component Value Date   WBC 5.7 06/13/2017   HGB 14.5 06/13/2017   HCT 44.0 06/13/2017   PLT 183 06/13/2017   GLUCOSE 129 (H) 05/31/2016   CHOL 162 06/13/2017   TRIG 195 (H) 06/13/2017   HDL 51 06/13/2017   LDLCALC 72 06/13/2017   ALT 18 05/31/2016   AST 22 05/31/2016   NA 140 05/31/2016   K 4.1 05/31/2016   CL 98 05/31/2016   CREATININE 1.02 05/31/2016   BUN 15 05/31/2016   CO2 25 05/31/2016   TSH 1.520 06/13/2017   PSA 2.7 11/14/2013   HGBA1C 6.4 (H) 06/13/2017   MICROALBUR 20 05/31/2016      Allergies  Allergen Reactions  . Oxycodone   . Penicillins Rash    Rash as a child.  Has not taken since then     Current Outpatient Medications:  .  amLODipine (NORVASC) 5 MG tablet, TAKE 1 TABLET BY MOUTH ONCE DAILY, Disp: 30 tablet, Rfl: 12 .  aspirin 81 MG tablet, Take 81 mg by mouth daily. , Disp: , Rfl:  .  Cholecalciferol 1000 UNITS capsule, Take 1,000 Units by mouth daily. , Disp: , Rfl:  .  clopidogrel (PLAVIX) 75 MG tablet, TAKE ONE TABLET BY MOUTH ONCE DAILY, Disp: 30 tablet, Rfl: 12 .  furosemide (LASIX) 40 MG tablet, TAKE ONE TABLET BY MOUTH ONCE DAILY, Disp: 90 tablet, Rfl: 3 .  Glucosamine-Chondroit-Vit C-Mn (GLUCOSAMINE CHONDR 1500 COMPLX) CAPS, Take by mouth., Disp: , Rfl:  .  glucose blood test strip, ONETOUCH ULTRA BLUE (In Vitro Strip)  Check sugar twice daily. DX E11.9, Disp: 100 each, Rfl: 12 .  IRON, IRON,, Take 65 mg by mouth daily. , Disp: , Rfl:  .  lisinopril-hydrochlorothiazide (PRINZIDE,ZESTORETIC) 20-12.5 MG tablet, Take 1 tablet by mouth daily., Disp: 90 tablet, Rfl: 3 .  lovastatin  (MEVACOR) 40 MG tablet, TAKE ONE TABLET BY MOUTH AT BEDTIME, Disp: 90 tablet, Rfl: 3 .  magnesium oxide (MAG-OX) 400 (241.3 MG) MG tablet, Take by mouth., Disp: , Rfl:  .  metFORMIN (GLUCOPHAGE) 1000 MG tablet, TAKE 1 TABLET BY MOUTH TWICE DAILY WITH A MEAL, Disp: 180 tablet, Rfl: 3 .  metoprolol tartrate (LOPRESSOR) 25 MG tablet, TAKE ONE TABLET BY MOUTH TWICE DAILY, Disp: 180 tablet, Rfl: 3 .  OMEGA-3 FATTY ACIDS PO, Take 1,000 mg by mouth daily. , Disp: , Rfl:  .  pioglitazone (ACTOS) 30 MG tablet, Take 1 tablet (30 mg total) by mouth daily., Disp: 90 tablet, Rfl: 3 .  HYDROcodone-acetaminophen (NORCO) 5-325 MG tablet, Norco 5 mg-325 mg tablet  Take 1 tablet(s) EVERY 6 HOURS by oral route., Disp: , Rfl:   Review of Systems  Constitutional: Negative.   Respiratory: Negative.   Cardiovascular: Negative.  Negative for chest pain.  Gastrointestinal: Negative.   Musculoskeletal: Positive for arthralgias.  Allergic/Immunologic: Negative.   Neurological: Negative.   Psychiatric/Behavioral: Negative.     Social History   Tobacco Use  . Smoking status: Former Smoker    Last attempt to quit: 11/17/1977    Years since quitting: 39.7  .  Smokeless tobacco: Never Used  Substance Use Topics  . Alcohol use: No    Alcohol/week: 0.0 oz   Objective:   BP 128/70 (BP Location: Right Arm, Patient Position: Sitting, Cuff Size: Normal)   Pulse 72   Temp 98.4 F (36.9 C) (Oral)   Resp 16   Wt 208 lb (94.3 kg)   BMI 32.58 kg/m    Physical Exam  Constitutional: He is oriented to person, place, and time. He appears well-developed and well-nourished.  HENT:  Head: Normocephalic and atraumatic.  Eyes: No scleral icterus.  Neck: Neck supple. No thyromegaly present.  Cardiovascular: Normal rate, regular rhythm and normal heart sounds.  Pulmonary/Chest: Effort normal and breath sounds normal.  Abdominal: Soft.  Musculoskeletal: He exhibits no edema.  Lymphadenopathy:    He has no cervical  adenopathy.  Neurological: He is alert and oriented to person, place, and time.  Skin: Skin is warm and dry.  Psychiatric: He has a normal mood and affect. His behavior is normal. Judgment and thought content normal.        Assessment & Plan:     1. Pre-operative clearance Pt medically cleared for shoulder surgery. - EKG 12-Lead - POCT urinalysis dipstick 2.CAD 3.TIIDM     I have done the exam and reviewed the chart and it is accurate to the best of my knowledge. Development worker, community has been used and  any errors in dictation or transcription are unintentional. Miguel Aschoff M.D. [place], MD  [facility]

## 2017-08-08 NOTE — Telephone Encounter (Signed)
Received [facility] Medical Clearance form for provider to fill out. Placed form in providers box. Thanks CC

## 2017-08-08 NOTE — Telephone Encounter (Signed)
In nurse for pt appt today.

## 2017-08-11 ENCOUNTER — Encounter
Admission: RE | Admit: 2017-08-11 | Discharge: 2017-08-11 | Disposition: A | Discharge status: Home / Self Care | Payer: Medicare PPO | Source: Ambulatory Visit | Attending: Orthopedic Surgery | Admitting: Orthopedic Surgery

## 2017-08-11 ENCOUNTER — Other Ambulatory Visit: Payer: Self-pay

## 2017-08-11 DIAGNOSIS — I252 Old myocardial infarction: Secondary | ICD-10-CM | POA: Diagnosis not present

## 2017-08-11 DIAGNOSIS — Z01812 Encounter for preprocedural laboratory examination: Secondary | ICD-10-CM | POA: Insufficient documentation

## 2017-08-11 HISTORY — DX: Gastrointestinal hemorrhage, unspecified: K92.2

## 2017-08-11 LAB — CBC WITH DIFFERENTIAL/PLATELET
BASOS ABS: 0 10*3/uL (ref 0–0.1)
Basophils Relative: 1 %
EOS ABS: 0.2 10*3/uL (ref 0–0.7)
EOS PCT: 4 %
HCT: 39.9 % — ABNORMAL LOW (ref 40.0–52.0)
Hemoglobin: 14 g/dL (ref 13.0–18.0)
Lymphocytes Relative: 13 %
Lymphs Abs: 0.8 10*3/uL — ABNORMAL LOW (ref 1.0–3.6)
MCH: 31.4 pg (ref 26.0–34.0)
MCHC: 35.2 g/dL (ref 32.0–36.0)
MCV: 89.2 fL (ref 80.0–100.0)
Monocytes Absolute: 0.4 10*3/uL (ref 0.2–1.0)
Monocytes Relative: 7 %
Neutro Abs: 4.6 10*3/uL (ref 1.4–6.5)
Neutrophils Relative %: 75 %
PLATELETS: 180 10*3/uL (ref 150–440)
RBC: 4.47 MIL/uL (ref 4.40–5.90)
RDW: 15 % — ABNORMAL HIGH (ref 11.5–14.5)
WBC: 6.2 10*3/uL (ref 3.8–10.6)

## 2017-08-11 LAB — BASIC METABOLIC PANEL
Anion gap: 7 (ref 5–15)
BUN: 24 mg/dL — AB (ref 6–20)
CALCIUM: 9.5 mg/dL (ref 8.9–10.3)
CO2: 27 mmol/L (ref 22–32)
CREATININE: 1.14 mg/dL (ref 0.61–1.24)
Chloride: 102 mmol/L (ref 101–111)
GFR calc Af Amer: >60 mL/min (ref >60)
Glucose, Bld: 209 mg/dL — ABNORMAL HIGH (ref 65–99)
Potassium: 3.5 mmol/L (ref 3.5–5.1)
SODIUM: 136 mmol/L (ref 135–145)

## 2017-08-11 LAB — PROTIME-INR
INR: 0.92
PROTHROMBIN TIME: 12.3 s (ref 11.4–15.2)

## 2017-08-11 LAB — APTT: APTT: 29 s (ref 24–36)

## 2017-08-11 NOTE — Patient Instructions (Signed)
Your procedure is scheduled HA:LPFX 4/16 Report to Day Surgery. To find out your arrival time please call [phone] between 1PM - 3PM on Mon 4/15.  Remember: Instructions that are not followed completely may result in serious medical risk, up to and including death, or upon the discretion of your surgeon and anesthesiologist your surgery may need to be rescheduled.     _X__ 1. Do not eat food after midnight the night before your procedure.                 No gum chewing or hard candies. You may drink clear liquids up to 2 hours                 before you are scheduled to arrive for your surgery- DO not drink clear                 liquids within 2 hours of the start of your surgery.                 Clear Liquids include:  Water, black coffee __X__2.  On the morning of surgery brush your teeth with toothpaste and water, you may rinse your mouth with mouthwash if you wish.  Do not swallow any              toothpaste of mouthwash.     _X__ 3.  No Alcohol for 24 hours before or after surgery.   ___ 4.  Do Not Smoke or use e-cigarettes For 24 Hours Prior to Your Surgery.                 Do not use any chewable tobacco products for at least 6 hours prior to                 surgery.  ____  5.  Bring all medications with you on the day of surgery if instructed.   __x__  6.  Notify your doctor if there is any change in your medical condition      (cold, fever, infections).     Do not wear jewelry, make-up, hairpins, clips or nail polish. Do not wear lotions, powders, or perfumes. You may wear deodorant. Do not shave 48 hours prior to surgery. Men may shave face and neck. Do not bring valuables to the hospital.    [facility] is not responsible for any belongings or valuables.  Contacts, dentures or bridgework may not be worn into surgery. Leave your suitcase in the car. After surgery it may be brought to your room. For patients admitted to the hospital, discharge  time is determined by your treatment team.   Patients discharged the day of surgery will not be allowed to drive home.   Please read over the following fact sheets that you were given:     __x__ Take these medicines the morning of surgery with A SIP OF WATER:    1. amLODipine (NORVASC) 5 MG tablet  2. HYDROcodone-acetaminophen (NORCO) 5-325 MG tablet if needed  3. metoprolol tartrate (LOPRESSOR) 25 MG tablet  4.  5.  6.  ____ Fleet Enema (as directed)   __x__ Use CHG Soap as directed  ____ Use inhalers on the day of surgery  __x__ Stop metformin 2 days prior to surgerymetFORMIN (GLUCOPHAGE) 1000 MG tablet Sunday    ____ Take 1/2 of usual insulin dose the night before surgery. No insulin the morning          of surgery.  __x__ Stop Plavix/aspirin stopped on 4/11  ____ Stop Anti-inflammatories on    __x__ Stop supplements until after surgery.  Omega-3 Fatty Acids (FISH OIL) 1000 MG CAPS  ____ Bring C-Pap to the hospital.

## 2017-08-15 ENCOUNTER — Ambulatory Visit
Admission: RE | Admit: 2017-08-15 | Discharge: 2017-08-15 | Disposition: A | Discharge status: Home / Self Care | Payer: Medicare PPO | Source: Ambulatory Visit | Attending: Orthopedic Surgery | Admitting: Orthopedic Surgery

## 2017-08-15 ENCOUNTER — Encounter
Admission: RE | Disposition: A | Discharge status: Home / Self Care | Payer: Self-pay | Source: Ambulatory Visit | Attending: Orthopedic Surgery

## 2017-08-15 ENCOUNTER — Ambulatory Visit: Payer: Medicare PPO | Admitting: Certified Registered Nurse Anesthetist

## 2017-08-15 DIAGNOSIS — M7581 Other shoulder lesions, right shoulder: Secondary | ICD-10-CM | POA: Diagnosis not present

## 2017-08-15 DIAGNOSIS — Z880 Allergy status to penicillin: Secondary | ICD-10-CM | POA: Insufficient documentation

## 2017-08-15 DIAGNOSIS — I2510 Atherosclerotic heart disease of native coronary artery without angina pectoris: Secondary | ICD-10-CM | POA: Insufficient documentation

## 2017-08-15 DIAGNOSIS — Z7902 Long term (current) use of antithrombotics/antiplatelets: Secondary | ICD-10-CM | POA: Insufficient documentation

## 2017-08-15 DIAGNOSIS — Z885 Allergy status to narcotic agent status: Secondary | ICD-10-CM | POA: Insufficient documentation

## 2017-08-15 DIAGNOSIS — X58XXXA Exposure to other specified factors, initial encounter: Secondary | ICD-10-CM | POA: Diagnosis not present

## 2017-08-15 DIAGNOSIS — Z8249 Family history of ischemic heart disease and other diseases of the circulatory system: Secondary | ICD-10-CM | POA: Diagnosis not present

## 2017-08-15 DIAGNOSIS — I509 Heart failure, unspecified: Secondary | ICD-10-CM | POA: Diagnosis not present

## 2017-08-15 DIAGNOSIS — E669 Obesity, unspecified: Secondary | ICD-10-CM | POA: Insufficient documentation

## 2017-08-15 DIAGNOSIS — I252 Old myocardial infarction: Secondary | ICD-10-CM | POA: Insufficient documentation

## 2017-08-15 DIAGNOSIS — Z7982 Long term (current) use of aspirin: Secondary | ICD-10-CM | POA: Diagnosis not present

## 2017-08-15 DIAGNOSIS — Z955 Presence of coronary angioplasty implant and graft: Secondary | ICD-10-CM | POA: Diagnosis not present

## 2017-08-15 DIAGNOSIS — Z79899 Other long term (current) drug therapy: Secondary | ICD-10-CM | POA: Diagnosis not present

## 2017-08-15 DIAGNOSIS — M7541 Impingement syndrome of right shoulder: Secondary | ICD-10-CM | POA: Insufficient documentation

## 2017-08-15 DIAGNOSIS — M19011 Primary osteoarthritis, right shoulder: Secondary | ICD-10-CM | POA: Insufficient documentation

## 2017-08-15 DIAGNOSIS — Z87891 Personal history of nicotine dependence: Secondary | ICD-10-CM | POA: Insufficient documentation

## 2017-08-15 DIAGNOSIS — Z6831 Body mass index (BMI) 31.0-31.9, adult: Secondary | ICD-10-CM | POA: Insufficient documentation

## 2017-08-15 DIAGNOSIS — Z7984 Long term (current) use of oral hypoglycemic drugs: Secondary | ICD-10-CM | POA: Insufficient documentation

## 2017-08-15 DIAGNOSIS — E119 Type 2 diabetes mellitus without complications: Secondary | ICD-10-CM | POA: Diagnosis not present

## 2017-08-15 DIAGNOSIS — I110 Hypertensive heart disease with heart failure: Secondary | ICD-10-CM | POA: Insufficient documentation

## 2017-08-15 DIAGNOSIS — S46111A Strain of muscle, fascia and tendon of long head of biceps, right arm, initial encounter: Secondary | ICD-10-CM | POA: Insufficient documentation

## 2017-08-15 HISTORY — PX: SHOULDER ARTHROSCOPY WITH OPEN ROTATOR CUFF REPAIR AND DISTAL CLAVICLE ACROMINECTOMY: SHX5683

## 2017-08-15 LAB — GLUCOSE, CAPILLARY
GLUCOSE-CAPILLARY: 130 mg/dL — AB (ref 65–99)
Glucose-Capillary: 133 mg/dL — ABNORMAL HIGH (ref 65–99)

## 2017-08-15 SURGERY — SHOULDER ARTHROSCOPY WITH OPEN ROTATOR CUFF REPAIR AND DISTAL CLAVICLE ACROMINECTOMY
Anesthesia: General | Site: Shoulder | Laterality: Right | Wound class: Clean

## 2017-08-15 MED ORDER — FENTANYL CITRATE (PF) 100 MCG/2ML IJ SOLN
INTRAMUSCULAR | Status: AC
  Filled 2017-08-15: qty 2

## 2017-08-15 MED ORDER — ONDANSETRON HCL 4 MG/2ML IJ SOLN
INTRAMUSCULAR | Status: DC | PRN
  Administered 2017-08-15: 4 mg via INTRAVENOUS

## 2017-08-15 MED ORDER — FENTANYL CITRATE (PF) 100 MCG/2ML IJ SOLN
25.0000 ug | INTRAMUSCULAR | Status: DC | PRN
  Administered 2017-08-15: 25 ug via INTRAVENOUS

## 2017-08-15 MED ORDER — PROPOFOL 10 MG/ML IV BOLUS: INTRAVENOUS | Status: DC | PRN

## 2017-08-15 MED ORDER — SUCCINYLCHOLINE CHLORIDE 20 MG/ML IJ SOLN
INTRAMUSCULAR | Status: AC
  Filled 2017-08-15: qty 1

## 2017-08-15 MED ORDER — EPHEDRINE SULFATE 50 MG/ML IJ SOLN
INTRAMUSCULAR | Status: DC | PRN
  Administered 2017-08-15: 5 mg via INTRAVENOUS
  Administered 2017-08-15 (×2): 10 mg via INTRAVENOUS
  Administered 2017-08-15 (×5): 5 mg via INTRAVENOUS

## 2017-08-15 MED ORDER — ACETAMINOPHEN 10 MG/ML IV SOLN
INTRAVENOUS | Status: DC | PRN
  Administered 2017-08-15: 1000 mg via INTRAVENOUS

## 2017-08-15 MED ORDER — FENTANYL CITRATE (PF) 100 MCG/2ML IJ SOLN
INTRAMUSCULAR | Status: AC
  Administered 2017-08-15: 25 ug via INTRAVENOUS
  Filled 2017-08-15: qty 2

## 2017-08-15 MED ORDER — CHLORHEXIDINE GLUCONATE CLOTH 2 % EX PADS
6.0000 | MEDICATED_PAD | Freq: Once | CUTANEOUS | Status: AC
  Administered 2017-08-15: 6 via TOPICAL

## 2017-08-15 MED ORDER — EPHEDRINE SULFATE 50 MG/ML IJ SOLN
INTRAMUSCULAR | Status: AC
Start: 2017-08-15 — End: ?
  Filled 2017-08-15: qty 1

## 2017-08-15 MED ORDER — BUPIVACAINE LIPOSOME 1.3 % IJ SUSP
INTRAMUSCULAR | Status: AC
  Filled 2017-08-15: qty 20

## 2017-08-15 MED ORDER — BUPIVACAINE HCL (PF) 0.25 % IJ SOLN
INTRAMUSCULAR | Status: AC
  Filled 2017-08-15: qty 30

## 2017-08-15 MED ORDER — BUPIVACAINE HCL (PF) 0.5 % IJ SOLN
INTRAMUSCULAR | Status: AC
  Filled 2017-08-15: qty 10

## 2017-08-15 MED ORDER — SUGAMMADEX SODIUM 200 MG/2ML IV SOLN
INTRAVENOUS | Status: DC | PRN
  Administered 2017-08-15: 200 mg via INTRAVENOUS

## 2017-08-15 MED ORDER — PROMETHAZINE HCL 25 MG/ML IJ SOLN: 6.2500 mg | INTRAMUSCULAR | Status: DC | PRN

## 2017-08-15 MED ORDER — NEOMYCIN-POLYMYXIN B GU 40-200000 IR SOLN
Status: AC
  Filled 2017-08-15: qty 2

## 2017-08-15 MED ORDER — ONDANSETRON HCL 4 MG/2ML IJ SOLN
INTRAMUSCULAR | Status: AC
  Filled 2017-08-15: qty 2

## 2017-08-15 MED ORDER — SODIUM CHLORIDE 0.9 % IV SOLN
INTRAVENOUS | Status: DC
  Administered 2017-08-15: 08:00:00 via INTRAVENOUS

## 2017-08-15 MED ORDER — EPINEPHRINE 30 MG/30ML IJ SOLN
INTRAMUSCULAR | Status: AC
  Filled 2017-08-15: qty 1

## 2017-08-15 MED ORDER — FAMOTIDINE 20 MG PO TABS
ORAL_TABLET | ORAL | Status: AC
  Administered 2017-08-15: 20 mg via ORAL
  Filled 2017-08-15: qty 1

## 2017-08-15 MED ORDER — BUPIVACAINE HCL 0.25 % IJ SOLN
INTRAMUSCULAR | Status: DC | PRN
  Administered 2017-08-15: 30 mL

## 2017-08-15 MED ORDER — PROPOFOL 10 MG/ML IV BOLUS
INTRAVENOUS | Status: AC
  Filled 2017-08-15: qty 20

## 2017-08-15 MED ORDER — CHLORHEXIDINE GLUCONATE CLOTH 2 % EX PADS: 6.0000 | MEDICATED_PAD | Freq: Once | CUTANEOUS | Status: DC

## 2017-08-15 MED ORDER — LIDOCAINE HCL (PF) 1 % IJ SOLN
INTRAMUSCULAR | Status: AC
  Filled 2017-08-15: qty 30

## 2017-08-15 MED ORDER — SUGAMMADEX SODIUM 200 MG/2ML IV SOLN
INTRAVENOUS | Status: AC
  Filled 2017-08-15: qty 2

## 2017-08-15 MED ORDER — MIDAZOLAM HCL 2 MG/2ML IJ SOLN
INTRAMUSCULAR | Status: AC
  Filled 2017-08-15: qty 2

## 2017-08-15 MED ORDER — LIDOCAINE HCL (PF) 2 % IJ SOLN
INTRAMUSCULAR | Status: AC
  Filled 2017-08-15: qty 10

## 2017-08-15 MED ORDER — NEOMYCIN-POLYMYXIN B GU 40-200000 IR SOLN
Status: DC | PRN
  Administered 2017-08-15: 2 mL

## 2017-08-15 MED ORDER — ACETAMINOPHEN 10 MG/ML IV SOLN
INTRAVENOUS | Status: AC
  Filled 2017-08-15: qty 100

## 2017-08-15 MED ORDER — FENTANYL CITRATE (PF) 100 MCG/2ML IJ SOLN
INTRAMUSCULAR | Status: DC | PRN
  Administered 2017-08-15: 25 ug via INTRAVENOUS

## 2017-08-15 MED ORDER — ONDANSETRON HCL 4 MG PO TABS: 4.0000 mg | ORAL_TABLET | Freq: Three times a day (TID) | ORAL | 0 refills | Status: DC | PRN

## 2017-08-15 MED ORDER — PROPOFOL 10 MG/ML IV BOLUS
INTRAVENOUS | Status: DC | PRN
  Administered 2017-08-15: 80 mg via INTRAVENOUS
  Administered 2017-08-15: 120 mg via INTRAVENOUS

## 2017-08-15 MED ORDER — ROCURONIUM BROMIDE 50 MG/5ML IV SOLN
INTRAVENOUS | Status: AC
  Filled 2017-08-15: qty 1

## 2017-08-15 MED ORDER — FAMOTIDINE 20 MG PO TABS
20.0000 mg | ORAL_TABLET | Freq: Once | ORAL | Status: AC
  Administered 2017-08-15: 20 mg via ORAL

## 2017-08-15 MED ORDER — BUPIVACAINE HCL (PF) 0.5 % IJ SOLN
INTRAMUSCULAR | Status: DC | PRN
  Administered 2017-08-15: 10 mL via PERINEURAL

## 2017-08-15 MED ORDER — DEXAMETHASONE SODIUM PHOSPHATE 10 MG/ML IJ SOLN
INTRAMUSCULAR | Status: DC | PRN
  Administered 2017-08-15: 5 mg via INTRAVENOUS

## 2017-08-15 MED ORDER — LIDOCAINE HCL (PF) 1 % IJ SOLN
INTRAMUSCULAR | Status: DC | PRN
  Administered 2017-08-15: 3 mL

## 2017-08-15 MED ORDER — HYDROCODONE-ACETAMINOPHEN 5-325 MG PO TABS: 1.0000 | ORAL_TABLET | Freq: Four times a day (QID) | ORAL | 0 refills | Status: DC | PRN

## 2017-08-15 MED ORDER — LIDOCAINE HCL (CARDIAC) 20 MG/ML IV SOLN
INTRAVENOUS | Status: DC | PRN
  Administered 2017-08-15: 100 mg via INTRAVENOUS

## 2017-08-15 MED ORDER — CLINDAMYCIN PHOSPHATE 900 MG/50ML IV SOLN
INTRAVENOUS | Status: AC
  Filled 2017-08-15: qty 50

## 2017-08-15 MED ORDER — BUPIVACAINE LIPOSOME 1.3 % IJ SUSP
INTRAMUSCULAR | Status: DC | PRN
  Administered 2017-08-15: 20 mL via PERINEURAL

## 2017-08-15 MED ORDER — LACTATED RINGERS IV SOLN: INTRAVENOUS | Status: DC

## 2017-08-15 MED ORDER — CLINDAMYCIN PHOSPHATE 900 MG/50ML IV SOLN
900.0000 mg | INTRAVENOUS | Status: AC
  Administered 2017-08-15: 900 mg via INTRAVENOUS

## 2017-08-15 MED ORDER — MIDAZOLAM HCL 2 MG/2ML IJ SOLN
INTRAMUSCULAR | Status: AC
  Administered 2017-08-15: 1 mg
  Filled 2017-08-15: qty 2

## 2017-08-15 MED ORDER — ROCURONIUM BROMIDE 100 MG/10ML IV SOLN
INTRAVENOUS | Status: DC | PRN
  Administered 2017-08-15: 50 mg via INTRAVENOUS
  Administered 2017-08-15: 10 mg via INTRAVENOUS

## 2017-08-15 MED ORDER — FENTANYL CITRATE (PF) 100 MCG/2ML IJ SOLN
INTRAMUSCULAR | Status: AC
  Administered 2017-08-15: 50 ug
  Filled 2017-08-15: qty 2

## 2017-08-15 MED ORDER — DEXAMETHASONE SODIUM PHOSPHATE 10 MG/ML IJ SOLN
INTRAMUSCULAR | Status: AC
Start: 2017-08-15 — End: ?
  Filled 2017-08-15: qty 1

## 2017-08-15 MED ORDER — LIDOCAINE HCL (CARDIAC) 20 MG/ML IV SOLN: INTRAVENOUS | Status: DC | PRN

## 2017-08-15 MED ORDER — MEPERIDINE HCL 50 MG/ML IJ SOLN: 6.2500 mg | INTRAMUSCULAR | Status: DC | PRN

## 2017-08-15 MED ORDER — LIDOCAINE HCL (PF) 1 % IJ SOLN
INTRAMUSCULAR | Status: AC
  Filled 2017-08-15: qty 5

## 2017-08-15 SURGICAL SUPPLY — 68 items
ADAPTER IRRIG TUBE 2 SPIKE SOL (ADAPTER) ×6 IMPLANT
ANCHOR ALL-SUT Q-FIX 2.8 (Anchor) ×3 IMPLANT
BUR RADIUS 4.0X18.5 (BURR) ×3 IMPLANT
BUR RADIUS 5.5 (BURR) ×3 IMPLANT
CANISTER SUCT LVC 12 LTR MEDI- (MISCELLANEOUS) IMPLANT
CANNULA 5.75X7 CRYSTAL CLEAR (CANNULA) IMPLANT
CANNULA PARTIAL THREAD 2X7 (CANNULA) IMPLANT
CANNULA TWIST IN 8.25X9CM (CANNULA) IMPLANT
CLOSURE WOUND 1/2 X4 (GAUZE/BANDAGES/DRESSINGS)
CONNECTOR PERFECT PASSER (CONNECTOR) ×3 IMPLANT
COOLER POLAR GLACIER W/PUMP (MISCELLANEOUS) ×3 IMPLANT
CRADLE LAMINECT ARM (MISCELLANEOUS) ×6 IMPLANT
DEVICE SUCT BLK HOLE OR FLOOR (MISCELLANEOUS) ×3 IMPLANT
DRAPE IMP U-DRAPE 54X76 (DRAPES) ×3 IMPLANT
DRAPE INCISE IOBAN 66X45 STRL (DRAPES) ×3 IMPLANT
DRAPE SHEET LG 3/4 BI-LAMINATE (DRAPES) ×3 IMPLANT
DRAPE U-SHAPE 47X51 STRL (DRAPES) ×3 IMPLANT
DURAPREP 26ML APPLICATOR (WOUND CARE) ×6 IMPLANT
ELECT REM PT RETURN 9FT ADLT (ELECTROSURGICAL) ×3
ELECTRODE REM PT RTRN 9FT ADLT (ELECTROSURGICAL) ×1 IMPLANT
GAUZE PETRO XEROFOAM 1X8 (MISCELLANEOUS) IMPLANT
GAUZE SPONGE 4X4 12PLY STRL (GAUZE/BANDAGES/DRESSINGS) ×3 IMPLANT
GLOVE BIOGEL PI IND STRL 9 (GLOVE) ×1 IMPLANT
GLOVE BIOGEL PI INDICATOR 9 (GLOVE) ×2
GLOVE SURG 9.0 ORTHO LTXF (GLOVE) ×9 IMPLANT
GOWN STRL REUS TWL 2XL XL LVL4 (GOWN DISPOSABLE) ×3 IMPLANT
GOWN STRL REUS W/ TWL LRG LVL3 (GOWN DISPOSABLE) ×1 IMPLANT
GOWN STRL REUS W/TWL LRG LVL3 (GOWN DISPOSABLE) ×2
IV LACTATED RINGER IRRG 3000ML (IV SOLUTION) ×8
IV LR IRRIG 3000ML ARTHROMATIC (IV SOLUTION) ×4 IMPLANT
KIT STABILIZATION SHOULDER (MISCELLANEOUS) ×3 IMPLANT
KIT SUTURE 2.8 Q-FIX DISP (MISCELLANEOUS) ×3 IMPLANT
KIT SUTURETAK 3.0 INSERT PERC (KITS) IMPLANT
KIT TURNOVER KIT A (KITS) ×3 IMPLANT
MANIFOLD NEPTUNE II (INSTRUMENTS) ×3 IMPLANT
MASK FACE SPIDER DISP (MASK) ×3 IMPLANT
MAT BLUE FLOOR 46X72 FLO (MISCELLANEOUS) ×6 IMPLANT
MULTI S KNOTLES FIXATION 5.5 (Orthopedic Implant) ×3 IMPLANT
NDL SAFETY ECLIPSE 18X1.5 (NEEDLE) IMPLANT
NEEDLE HYPO 18GX1.5 SHARP (NEEDLE)
NEEDLE HYPO 22GX1.5 SAFETY (NEEDLE) IMPLANT
NS IRRIG 500ML POUR BTL (IV SOLUTION) ×3 IMPLANT
PACK ARTHROSCOPY SHOULDER (MISCELLANEOUS) ×3 IMPLANT
PAD WRAPON POLAR SHDR XLG (MISCELLANEOUS) ×1 IMPLANT
PASSER SUT CAPTURE FIRST (SUTURE) ×3 IMPLANT
SET TUBE SUCT SHAVER OUTFL 24K (TUBING) ×3 IMPLANT
SET TUBE TIP INTRA-ARTICULAR (MISCELLANEOUS) IMPLANT
STRIP CLOSURE SKIN 1/2X4 (GAUZE/BANDAGES/DRESSINGS) IMPLANT
SUT ETHILON 4-0 (SUTURE) ×2
SUT ETHILON 4-0 FS2 18XMFL BLK (SUTURE) ×1
SUT LASSO 90 DEG SD STR (SUTURE) IMPLANT
SUT MNCRL 4-0 (SUTURE) ×2
SUT MNCRL 4-0 27XMFL (SUTURE) ×1
SUT PDS AB 0 CT1 27 (SUTURE) IMPLANT
SUT PERFECTPASSER WHITE CART (SUTURE) ×12 IMPLANT
SUT SMART STITCH CARTRIDGE (SUTURE) IMPLANT
SUT VIC AB 0 CT1 36 (SUTURE) ×3 IMPLANT
SUT VIC AB 2-0 CT2 27 (SUTURE) ×3 IMPLANT
SUTURE ETHLN 4-0 FS2 18XMF BLK (SUTURE) ×1 IMPLANT
SUTURE MAGNUM WIRE 2X48 BLK (SUTURE) IMPLANT
SUTURE MNCRL 4-0 27XMF (SUTURE) ×1 IMPLANT
SYR 10ML LL (SYRINGE) IMPLANT
TAPE MICROFOAM 4IN (TAPE) IMPLANT
TUBING ARTHRO INFLOW-ONLY STRL (TUBING) ×3 IMPLANT
TUBING CONNECTING 10 (TUBING) IMPLANT
TUBING CONNECTING 10' (TUBING)
WAND HAND CNTRL MULTIVAC 90 (MISCELLANEOUS) ×3 IMPLANT
WRAPON POLAR PAD SHDR XLG (MISCELLANEOUS) ×3

## 2017-08-15 NOTE — OR Nursing (Signed)
Discussed discharge instructions with pt nd wife. Both voice understanding.

## 2017-08-15 NOTE — Anesthesia Procedure Notes (Signed)
Anesthesia Regional Block: Interscalene brachial plexus block   Pre-Anesthetic Checklist: ,, timeout performed, Correct Patient, Correct Site, Correct Laterality, Correct Procedure, Correct Position, site marked, Risks and benefits discussed,  Surgical consent,  Pre-op evaluation,  At surgeon's request and post-op pain management  Laterality: Right  Prep: chloraprep       Needles:  Injection technique: Single-shot  Needle Type: Stimiplex     Needle Length: 9cm  Needle Gauge: 21     Additional Needles:   Procedures:,,,, ultrasound used (permanent image in chart),,,,  Narrative:  Start time: 08/15/2017 8:55 AM End time: 08/15/2017 9:02 AM Injection made incrementally with aspirations every 5 mL.  Performed by: Personally  Anesthesiologist: Emmie Niemann, MD  Additional Notes: Functioning IV was confirmed and monitors were applied.  A Stimuplex needle was used. Sterile prep and drape,hand hygiene and sterile gloves were used.  Negative aspiration and negative test dose prior to incremental administration of local anesthetic. The patient tolerated the procedure well.

## 2017-08-15 NOTE — Anesthesia Postprocedure Evaluation (Signed)
Anesthesia Post Note  Patient: BECKER CHRISTOPHER  Procedure(s) Performed: right shoulder arthroscopy, arthroscopic subacromial decompression, distal clavicle excision, biceps tenotomy, open rotator cuff repair (Right Shoulder)  Patient location during evaluation: PACU Anesthesia Type: General Level of consciousness: awake and alert and oriented Pain management: pain level controlled Vital Signs Assessment: post-procedure vital signs reviewed and stable Respiratory status: spontaneous breathing, nonlabored ventilation and respiratory function stable Cardiovascular status: blood pressure returned to baseline and stable Postop Assessment: no signs of nausea or vomiting Anesthetic complications: no     Last Vitals:  Vitals:   08/15/17 1347 08/15/17 1413  BP: 131/61 (!) 125/54  Pulse: 64   Resp: 18   Temp: (!) 36.2 C   SpO2: 98% 98%    Last Pain:  Vitals:   08/15/17 1347  PainSc: 0-No pain                 Gaige Sebo

## 2017-08-15 NOTE — H&P (Signed)
PREOPERATIVE H&P  Chief Complaint: Right shoulder pain  HPI: Todd Olson is a 77 y.o. male who presents for preoperative history and physical with a diagnosis of a biceps tendon tear and impingement of the right shoulder Symptoms are rated as moderate to severe, and have been worsening.  This is significantly impairing activities of daily living.  He has elected for surgical management.   Past Medical History:  Diagnosis Date  . Anemia   . CHF (congestive heart failure) ([place])   . Coronary artery disease   . Diabetes mellitus without complication ([place])   . GI bleeding    upper and lower  . History of hiatal hernia   . Hypertension   . Myocardial infarction ([place])    X 4 LAST 2003   Past Surgical History:  Procedure Laterality Date  . BACK SURGERY  2016   laminectomy lumbar  . CATARACT EXTRACTION W/PHACO Left 12/06/2016   Procedure: CATARACT EXTRACTION PHACO AND INTRAOCULAR LENS PLACEMENT (IOC);  Surgeon: Birder Robson, MD;  Location: [facility] ORS;  Service: Ophthalmology;  Laterality: Left;  [place] 00:31 AP% 15.6 CDE 4.99 Fluid pack lot # [id number]  . CATARACT EXTRACTION W/PHACO Right 12/27/2016   Procedure: CATARACT EXTRACTION PHACO AND INTRAOCULAR LENS PLACEMENT (IOC);  Surgeon: Birder Robson, MD;  Location: [facility] ORS;  Service: Ophthalmology;  Laterality: Right;  [place] 00:46 AP% 12.5 CDE 5.79 Fluid pack lot # [id number]  . CHOLECYSTECTOMY  2012   Dr. Rochel Brome  . colonosopy    . CORONARY ANGIOPLASTY     STENT PLACEMENT  . EYE SURGERY     bilateral  . FOOT SURGERY    . HEMORROIDECTOMY    . RECTAL SURGERY     repair of bleed after hemrrhoid surgery  . SHOULDER SURGERY     Social History   Socioeconomic History  . Marital status: Married    Spouse name: Not on file  . Number of children: 3  . Years of education: Not on file  . Highest education level: Associate degree: occupational, Hotel manager, or vocational program  Occupational History  . Occupation: retired  Photographer  . Financial resource strain: Not hard at all  . Food insecurity:    Worry: Never true    Inability: Never true  . Transportation needs:    Medical: No    Non-medical: No  Tobacco Use  . Smoking status: Former Smoker    Last attempt to quit: 11/17/1977    Years since quitting: 39.7  . Smokeless tobacco: Never Used  Substance and Sexual Activity  . Alcohol use: No    Alcohol/week: 0.0 oz  . Drug use: No  . Sexual activity: Not Currently  Lifestyle  . Physical activity:    Days per week: Not on file    Minutes per session: Not on file  . Stress: Only a little  Relationships  . Social connections:    Talks on phone: Not on file    Gets together: Not on file    Attends religious service: Not on file    Active member of club or organization: Not on file    Attends meetings of clubs or organizations: Not on file    Relationship status: Not on file  Other Topics Concern  . Not on file  Social History Narrative  . Not on file   Family History  Problem Relation Age of Onset  . Heart disease Mother   . Heart disease Father   . Hypertension Father   .  Bipolar disorder Sister        with hospitalizations  . Diabetes Brother   . Hypertension Brother   . Heart disease Brother   . Diabetes Brother   . Heart disease Brother        has stents   Allergies  Allergen Reactions  . Oxycodone Other (See Comments)    hallucination  . Penicillins Rash    Rash as a child.  Has not taken since then Has patient had a PCN reaction causing immediate rash, facial/tongue/throat swelling, SOB or lightheadedness with hypotension: Yes Has patient had a PCN reaction causing severe rash involving mucus membranes or skin necrosis: No Has patient had a PCN reaction that required hospitalization: No Has patient had a PCN reaction occurring within the last 10 years: No If all of the above answers are "NO", then may proceed with Cephalosporin use.    Prior to Admission medications    Medication Sig Start Date End Date Taking? Authorizing Provider  acetaminophen (TYLENOL) 500 MG tablet Take 500 mg by mouth daily as needed for headache.   Yes [provider]  amLODipine (NORVASC) 5 MG tablet TAKE 1 TABLET BY MOUTH ONCE DAILY 07/31/17  Yes Jerrol Banana., MD  aspirin 81 MG tablet Take 81 mg by mouth every other day. At night 12/28/12  Yes [provider]  Carboxymeth-Glycerin-Polysorb (REFRESH OPTIVE MEGA-3 OP) Place 1 drop into both eyes daily as needed (dry eyes).   Yes [provider]  Cholecalciferol 1000 UNITS capsule Take 1,000 Units by mouth daily.  06/01/10  Yes [provider]  clopidogrel (PLAVIX) 75 MG tablet TAKE ONE TABLET BY MOUTH ONCE DAILY 12/04/16  Yes Jerrol Banana., MD  ferrous sulfate 325 (65 FE) MG tablet Take 325 mg by mouth daily.   Yes [provider]  furosemide (LASIX) 40 MG tablet TAKE ONE TABLET BY MOUTH ONCE DAILY 10/15/16  Yes Jerrol Banana., MD  Glucosamine-Chondroit-Vit C-Mn (GLUCOSAMINE CHONDR 1500 COMPLX) CAPS Take 1 capsule by mouth 2 (two) times daily.  06/01/10  Yes [provider]  glucose blood test strip ONETOUCH ULTRA BLUE (In Vitro Strip)  Check sugar twice daily. DX E11.9 07/15/16  Yes Jerrol Banana., MD  HYDROcodone-acetaminophen [facility]) 5-325 MG tablet Take 1 tablet by mouth every 6 hours as needed for pain   Yes [provider]  lisinopril-hydrochlorothiazide (PRINZIDE,ZESTORETIC) 20-12.5 MG tablet Take 1 tablet by mouth daily. 09/15/16  Yes Jerrol Banana., MD  lovastatin (MEVACOR) 40 MG tablet TAKE ONE TABLET BY MOUTH AT BEDTIME 02/24/17  Yes Jerrol Banana., MD  magnesium oxide (MAG-OX) 400 (241.3 MG) MG tablet Take 400 mg by mouth 2 (two) times daily.  03/12/12  Yes [provider]  metFORMIN (GLUCOPHAGE) 1000 MG tablet TAKE 1 TABLET BY MOUTH TWICE DAILY WITH A MEAL 07/26/17  Yes Jerrol Banana., MD  metoprolol tartrate  (LOPRESSOR) 25 MG tablet TAKE ONE TABLET BY MOUTH TWICE DAILY 10/20/16  Yes Jerrol Banana., MD  Omega-3 Fatty Acids (FISH OIL) 1000 MG CAPS Take 525 mg by mouth daily.    Yes [provider]  pioglitazone (ACTOS) 30 MG tablet Take 1 tablet (30 mg total) by mouth daily. 06/14/17  Yes Jerrol Banana., MD     Positive ROS: All other systems have been reviewed and were otherwise negative with the exception of those mentioned in the HPI and as above.  Physical Exam: General:  Alert, no acute distress Cardiovascular: Regular rate and rhythm, no murmurs rubs or gallops.  No pedal edema Respiratory: Clear to auscultation bilaterally, no wheezes rales or rhonchi. No cyanosis, no use of accessory musculature GI: No organomegaly, abdomen is soft and non-tender nondistended with positive bowel sounds. Skin: Skin intact, no lesions within the operative field. Neurologic: Sensation intact distally Psychiatric: Patient is competent for consent with normal mood and affect Lymphatic: No cervical lymphadenopathy  MUSCULOSKELETAL: Right shoulder:  Skin intact.  No erythema, ecchymosis or effusion.  NVI.  +impingement signs  + Speed's test and pain with O'Brien's test  No definite weakness to the rotator cuff but pain with a downward directed force on his abducted shoulder.  Assessment: Right shoulder biceps tendon tear and impingement with underlying rotator cuff tendonitis  Plan: Plan for Procedure(s): RIGHT SHOULDER ARTHROSCOPY WITH BICEPS TENOTOMY, SUBACROMIAL DECOMPRESSION, DISTAL CLAVICLE EXCISION AND POSSIBLE ROTATOR CUFF REPAIR  I have described the details of the operation as well as the post-op course.   He understands that I plan to treat the biceps tendon tear with a tenotomy as the split tear extends into the intertuberous groove and a tenodesis will likely not alleviate his pain.   He knows he will likely have a "popeye" deformity to his biceps once the surgery is over.   I  will examine the rotator cuff during surgery to determine if the tendon is torn and will fix it if patient has a high grade partial tear.  MRI does not suggest a full thickness tear.  I discussed the risks and benefits of surgery. The risks include but are not limited to infection, bleeding requiring blood transfusion, nerve or blood vessel injury, joint stiffness or loss of motion, persistent pain, weakness or instability, malunion, nonunion and hardware failure and the need for further surgery. Medical risks include but are not limited to DVT and pulmonary embolism, myocardial infarction, stroke, pneumonia, respiratory failure and death. Patient understood these risks and wished to proceed.    Thornton Park, MD   08/15/2017 9:24 AM

## 2017-08-15 NOTE — Anesthesia Preprocedure Evaluation (Signed)
Anesthesia Evaluation  Patient identified by MRN, date of birth, ID band Patient awake    Reviewed: Allergy & Precautions, NPO status , Patient's Chart, lab work & pertinent test results  History of Anesthesia Complications Negative for: history of anesthetic complications  Airway Mallampati: II  TM Distance: >3 FB Neck ROM: Full    Dental  (+) Edentulous Lower, Upper Dentures   Pulmonary neg sleep apnea, neg COPD, former smoker,    breath sounds clear to auscultation- rhonchi (-) wheezing      Cardiovascular hypertension, + CAD, + Past MI, + Cardiac Stents (2003) and +CHF (diastolic, EF 81%)   Rhythm:Regular Rate:Normal - Systolic murmurs and - Diastolic murmurs    Neuro/Psych negative neurological ROS  negative psych ROS   GI/Hepatic Neg liver ROS, hiatal hernia,   Endo/Other  diabetes, Oral Hypoglycemic Agents  Renal/GU negative Renal ROS     Musculoskeletal  (+) Arthritis ,   Abdominal (+) + obese,   Peds  Hematology  (+) anemia ,   Anesthesia Other Findings Past Medical History: No date: Anemia No date: CHF (congestive heart failure) ([facility]) No date: Coronary artery disease No date: Diabetes mellitus without complication ([facility]) No date: GI bleeding     Comment:  upper and lower No date: History of hiatal hernia No date: Hypertension No date: Myocardial infarction ([place])     Comment:  X 4 LAST 2003   Reproductive/Obstetrics                             Anesthesia Physical Anesthesia Plan  ASA: III  Anesthesia Plan: General   Post-op Pain Management:  Regional for Post-op pain   Induction: Intravenous  PONV Risk Score and Plan: 1 and Ondansetron and Midazolam  Airway Management Planned: Oral ETT  Additional Equipment:   Intra-op Plan:   Post-operative Plan: Extubation in OR  Informed Consent: I have reviewed the patients History and Physical, chart, labs and discussed  the procedure including the risks, benefits and alternatives for the proposed anesthesia with the patient or authorized representative who has indicated his/her understanding and acceptance.   Dental advisory given  Plan Discussed with: CRNA and Anesthesiologist  Anesthesia Plan Comments:         Anesthesia Quick Evaluation

## 2017-08-15 NOTE — Transfer of Care (Signed)
Immediate Anesthesia Transfer of Care Note  Patient: Todd Olson  Procedure(s) Performed: right shoulder arthroscopy, arthroscopic subacromial decompression, distal clavicle excision, biceps tenotomy, open rotator cuff repair (Right Shoulder)  Patient Location: PACU  Anesthesia Type:General  Level of Consciousness: oriented and sedated  Airway & Oxygen Therapy: Patient Spontanous Breathing and Patient connected to face mask oxygen  Post-op Assessment: Report given to RN and Post -op Vital signs reviewed and stable  Post vital signs: Reviewed and stable  Last Vitals:  Vitals Value Taken Time  BP 114/52 08/15/2017 12:42 PM  Temp 36.8 C 08/15/2017 12:42 PM  Pulse 57 08/15/2017 12:44 PM  Resp 16 08/15/2017 12:44 PM  SpO2 98 % 08/15/2017 12:44 PM  Vitals shown include unvalidated device data.  Last Pain:  Vitals:   08/15/17 0939  PainSc: 0-No pain         Complications: No apparent anesthesia complications

## 2017-08-15 NOTE — Discharge Instructions (Signed)

## 2017-08-15 NOTE — Anesthesia Procedure Notes (Signed)
Procedure Name: Intubation Date/Time: 08/15/2017 9:59 AM Performed by: Johnna Acosta, CRNA Pre-anesthesia Checklist: Patient identified, Emergency Drugs available, Suction available, Patient being monitored and Timeout performed Patient Re-evaluated:Patient Re-evaluated prior to induction Preoxygenation: Pre-oxygenation with 100% oxygen Induction Type: IV induction Ventilation: Mask ventilation without difficulty and Oral airway inserted - appropriate to patient size Laryngoscope Size: Sabra Heck and 2 Grade View: Grade I Tube type: Oral Tube size: 7.5 mm Number of attempts: 1 Airway Equipment and Method: Stylet Placement Confirmation: ETT inserted through vocal cords under direct vision,  positive ETCO2 and breath sounds checked- equal and bilateral Secured at: 21 cm Tube secured with: Tape Dental Injury: Teeth and Oropharynx as per pre-operative assessment

## 2017-08-15 NOTE — Anesthesia Post-op Follow-up Note (Signed)
Anesthesia QCDR form completed.        

## 2017-08-15 NOTE — Op Note (Signed)
08/15/2017  1:09 PM  PATIENT:  Todd Olson  77 y.o. male  PRE-OPERATIVE DIAGNOSIS: Right shoulder biceps tendon tear, acromioclavicular arthrosis, impingement and possible rotator cuff tear  POST-OPERATIVE DIAGNOSIS:  Right shoulder biceps tendon tear, acromioclavicular arthrosis, impingement and high grade partial thickness tear of the supraspinatus  PROCEDURE:  Procedure(s): right shoulder arthroscopy, arthroscopic subacromial decompression, distal clavicle excision, biceps tenotomy, open rotator cuff repair (Right)  SURGEON:  Surgeon(s) and Role:    Thornton Park, MD - Primary  ANESTHESIA:   local, general and paracervical block   PREOPERATIVE INDICATIONS:  Todd Olson is a  77 y.o. male with a diagnosis of S46.111A Stain of musc/fasc/ten long hd bicep, right arm, init who failed conservative measures and elected for surgical management.    The risks benefits and alternatives were discussed with the patient preoperatively including but not limited to the risks of infection, bleeding, nerve injury, persistent pain or weakness, failure of the hardware, re-tear of the rotator cuff and the need for further surgery. Medical risks include DVT and pulmonary embolism, myocardial infarction, stroke, pneumonia, respiratory failure and death. Patient understood these risks and wished to proceed.  OPERATIVE IMPLANTS: ArthroCare Multifix S anchor x 1 & [employer]hew Q Fix anchor x 1  OPERATIVE PROCEDURE: The patient was met in the preoperative area. The right shoulder was signed with the word yes and my initials according the hospital's correct site of surgery protocol. Patient underwent an interscalene block with Exparel by the anesthesia service in the pre-op area.  The patient is brought to the OR and underwent general endotracheal intubation by the anesthesia service.  The patient was placed in a beachchair position. A spider arm positioner was used for this case. Examination under  anesthesia revealed no instability or significant stiffness.  The patient was prepped and draped in a sterile fashion. A timeout was performed to verify the patient's name, date of birth, medical record number, correct site of surgery and correct procedure to be performed there was also used to verify the patient received antibiotics that all appropriate instruments, implants and radiographs studies were available in the room. Once all in attendance were in agreement case began.  Bony landmarks were drawn out with a surgical marker along with proposed arthroscopy incisions. These were pre-injected with 1% lidocaine plain. An 11 blade was used to establish a posterior portal through which the arthroscope was placed in the glenohumeral joint. A full diagnostic examination of the shoulder was performed. The anterior portal was established under direct visualization with an 18-gauge spinal needle.  A 5.75 mm arthroscopic cannula was placed through the anterior portal.   The intra-articular portion of the biceps tendon was found to have an extensive fraying, degeneration and long split tear extending into the intertuberous groove.  Therefore the decision was made to perform a tenotomy. An arthroscopic scissor was used to release the biceps tendon off the superior labrum. The arthroscopic shaver was then used to debride the frayed edges of the labrum. There were no anterior or superior labral tears seen.  Subscapularis tendon was intact with mild fraying. Patient had a high grade partial thickness tear involving the supraspinatus. There were no loose bodies within the inferior recess and no evidence of HAGL lesion.  The frayed fibers of the supraspinatus was debrided using a 4.0 resector shaver.  A 0 PDS was placed through the tear to mark it for identification from the bursal side of the cuff.  The arthroscope was then  placed in the subacromial space. A lateral portal was then established using an 18-gauge  spinal needle for localization.   Extensive bursitis was encountered and debrided using a 4-0 resector shaver blade and a 90 ArthroCare wand from the lateral portal. A subacromial decompression was also performed using a 5.5 mm resector shaver blade from the lateral portal. The 5.5 mm resector shaver blade was then placed through the anterior portal and distal clavicle excision was performed. The 0 PDS was identified.  The cuff was significantly thinned and the decision was made to fix this high grade partial tear.  The tear was completed using a 4.0 resector shaver blade.  The greater tuberosity was debrided using a 4.0  resector shaver blade to remove all remaining torn fibers of the rotator cuff.  Debridement was performed until punctate bleeding was seen at the greater tuberosity footprint, which will allow for rotator cuff healing.  Two ArthroCare Perfect Pass sutures were placed in the lateral border of the rotator cuff tear. All arthroscopic instruments were then removed and the mini-open portion of the procedure began.   A saber-type incision was made along the lateral border of the acromion. The deltoid muscle was identified and split in line with its fibers which allowed visualization of the rotator cuff. The Perfect Pass sutures previously placed in the lateral border of the rotator cuff werealso brought out through the deltoid split. A single Q-Fix anchor was then placed at the articular margin of the humeral head and greater tuberosity. The four suture limbs of the Q Fix anchor was passed medially through the rotator cuff using a first pass suture passer. The Perfect Pass sutures from the lateral border of the rotator cuff were then anchored to thegreater tuberosity of the humeral head using a [place] Multifix S anchor. The sutures were tensioned to allow for anatomic reduction of the rotator cuff to the greater tuberosity footprint. The medial row repair was then completed using an  arthroscopic knot tying technique with the Q fix anchor sutures. Once all sutures were tied down, arthroscopic images of the double row repair were taken with the arthroscope both externally and arthroscopically fromthe glenohumeral joint  All incisions were copiously irrigated. The deltoid fascia was repaired using a 0 Vicryl suturean interrupted fashion. The subcutaneous tissue of all incisions were closed with a 2-0 Vicryl. Skin closure for the arthroscopic incisions was performed with 4-0 nylon. The skin edges of the saber incision were approximated with a running 4-0 undyed Monocryl. 0.25%  Marcaine plain was injected into the subacromial space and at the injection sites.  A dry sterile dressing including Steri-Strips was applied . The patient was placed in an abduction sling, with a Polar Care sleeve.  All sharp and instrument counts were correct at the conclusion of the case. I was scrubbed and present for the entire case. I spoke with the patient's wife in the post-op consultation room and informed her that the case had been performed without complication and the patient was stable in recovery room.     Timoteo Gaul, MD

## 2017-08-16 ENCOUNTER — Encounter: Payer: Self-pay | Admitting: Orthopedic Surgery

## 2017-08-23 ENCOUNTER — Other Ambulatory Visit: Payer: Self-pay | Admitting: Family Medicine

## 2017-09-09 ENCOUNTER — Other Ambulatory Visit: Payer: Self-pay | Admitting: Family Medicine

## 2017-10-11 ENCOUNTER — Ambulatory Visit (INDEPENDENT_AMBULATORY_CARE_PROVIDER_SITE_OTHER): Payer: Medicare PPO | Admitting: Family Medicine

## 2017-10-11 ENCOUNTER — Encounter: Payer: Self-pay | Admitting: Family Medicine

## 2017-10-11 VITALS — BP 118/70 | Temp 98.5°F | Resp 16 | Wt 201.0 lb

## 2017-10-11 DIAGNOSIS — I25810 Atherosclerosis of coronary artery bypass graft(s) without angina pectoris: Secondary | ICD-10-CM

## 2017-10-11 DIAGNOSIS — E119 Type 2 diabetes mellitus without complications: Secondary | ICD-10-CM

## 2017-10-11 DIAGNOSIS — I10 Essential (primary) hypertension: Secondary | ICD-10-CM | POA: Diagnosis not present

## 2017-10-11 DIAGNOSIS — E7800 Pure hypercholesterolemia, unspecified: Secondary | ICD-10-CM

## 2017-10-11 LAB — POCT GLYCOSYLATED HEMOGLOBIN (HGB A1C)
Est. average glucose Bld gHb Est-mCnc: 148
Hemoglobin A1C: 6.8 % — AB (ref 4.0–5.6)

## 2017-10-11 NOTE — Progress Notes (Signed)
Patient: Todd Olson Male    DOB: [birth year]   76 y.o.   MRN: [id number] Visit Date: 10/11/2017  Today's Provider: [name], MD   Chief Complaint  Patient presents with  . Diabetes  . Hypertension  . Hyperlipidemia   Subjective:    HPI The patient is a 77 year old male who presents today for a follow up on chronic issues.  Diabetes Patient was last seen in the office 4 months ago. No changes were made in his medications. He is currently taking Metformin 1000mg  1 tablet twice daily. He also mentions that since his shoulder surgery, his FBS have been running a little high.   Lab Results  Component Value Date   HGBA1C 6.8 (A) 10/11/2017     Hypertension Patient was last seen in the office 4 months ago. No changes were made in his medications. He is currently taking metoprolol 25mg  BID and Lisinopril/HCTZ 20/12.5mg  QD. He reports good compliance and good symptom control.   BP Readings from Last 3 Encounters:  10/11/17 118/70  08/15/17 (!) 125/54  08/11/17 129/69        Allergies  Allergen Reactions  . Oxycodone Other (See Comments)    hallucination  . Penicillins Rash    Rash as a child.  Has not taken since then Has patient had a PCN reaction causing immediate rash, facial/tongue/throat swelling, SOB or lightheadedness with hypotension: Yes Has patient had a PCN reaction causing severe rash involving mucus membranes or skin necrosis: No Has patient had a PCN reaction that required hospitalization: No Has patient had a PCN reaction occurring within the last 10 years: No If all of the above answers are "NO", then may proceed with Cephalosporin use.      Current Outpatient Medications:  .  amLODipine (NORVASC) 5 MG tablet, TAKE 1 TABLET BY MOUTH ONCE DAILY, Disp: 30 tablet, Rfl: 12 .  aspirin 81 MG tablet, Take 81 mg by mouth every other day. At night, Disp: , Rfl:  .  Carboxymeth-Glycerin-Polysorb (REFRESH OPTIVE MEGA-3 OP), Place 1 drop into  both eyes daily as needed (dry eyes)., Disp: , Rfl:  .  Cholecalciferol 1000 UNITS capsule, Take 1,000 Units by mouth daily. , Disp: , Rfl:  .  clopidogrel (PLAVIX) 75 MG tablet, TAKE ONE TABLET BY MOUTH ONCE DAILY, Disp: 30 tablet, Rfl: 12 .  ferrous sulfate 325 (65 FE) MG tablet, Take 325 mg by mouth daily., Disp: , Rfl:  .  furosemide (LASIX) 40 MG tablet, TAKE ONE TABLET BY MOUTH ONCE DAILY, Disp: 90 tablet, Rfl: 3 .  Glucosamine-Chondroit-Vit C-Mn (GLUCOSAMINE CHONDR 1500 COMPLX) CAPS, Take 1 capsule by mouth 2 (two) times daily. , Disp: , Rfl:  .  glucose blood (ONE TOUCH ULTRA TEST) test strip, USE TO CHECK BLOOD SUGAR TWICE DAILY, Disp: 100 each, Rfl: 12 .  HYDROcodone-acetaminophen (NORCO) 5-325 MG tablet, Take 1-2 tablets by mouth every 6 (six) hours as needed for moderate pain. MAXIMUM TOTAL ACETAMINOPHEN DOSE IS 4000 MG PER DAY, Disp: 40 tablet, Rfl: 0 .  lisinopril-hydrochlorothiazide (PRINZIDE,ZESTORETIC) 20-12.5 MG tablet, TAKE 1 TABLET BY MOUTH ONCE DAILY, Disp: 90 tablet, Rfl: 3 .  lovastatin (MEVACOR) 40 MG tablet, TAKE ONE TABLET BY MOUTH AT BEDTIME, Disp: 90 tablet, Rfl: 3 .  magnesium oxide (MAG-OX) 400 (241.3 MG) MG tablet, Take 400 mg by mouth 2 (two) times daily. , Disp: , Rfl:  .  metFORMIN (GLUCOPHAGE) 1000 MG tablet, TAKE 1 TABLET BY MOUTH  TWICE DAILY WITH A MEAL, Disp: 180 tablet, Rfl: 3 .  metoprolol tartrate (LOPRESSOR) 25 MG tablet, TAKE ONE TABLET BY MOUTH TWICE DAILY, Disp: 180 tablet, Rfl: 3 .  Omega-3 Fatty Acids (FISH OIL) 1000 MG CAPS, Take 525 mg by mouth daily. , Disp: , Rfl:  .  ondansetron (ZOFRAN) 4 MG tablet, Take 1 tablet (4 mg total) by mouth every 8 (eight) hours as needed for nausea or vomiting., Disp: 30 tablet, Rfl: 0 .  pioglitazone (ACTOS) 30 MG tablet, Take 1 tablet (30 mg total) by mouth daily., Disp: 90 tablet, Rfl: 3  Review of Systems  Constitutional: Negative.   HENT: Negative.   Eyes: Negative.   Respiratory: Negative for cough and  shortness of breath.   Cardiovascular: Negative for chest pain, palpitations and leg swelling.  Endocrine: Negative for cold intolerance, heat intolerance, polydipsia and polyphagia.  Musculoskeletal: Positive for arthralgias. Negative for back pain.  Allergic/Immunologic: Negative.   Neurological: Negative for dizziness, light-headedness and headaches.  Psychiatric/Behavioral: Negative.     Social History   Tobacco Use  . Smoking status: Former Smoker    Last attempt to quit: 11/17/1977    Years since quitting: 39.9  . Smokeless tobacco: Never Used  Substance Use Topics  . Alcohol use: No    Alcohol/week: 0.0 oz   Objective:   BP 118/70 (BP Location: Right Arm, Patient Position: Sitting, Cuff Size: Normal)   Temp 98.5 F (36.9 C)   Resp 16   Wt 201 lb (91.2 kg)   BMI 30.56 kg/m  Vitals:   10/11/17 0825  BP: 118/70  Resp: 16  Temp: 98.5 F (36.9 C)  Weight: 201 lb (91.2 kg)     Physical Exam  Constitutional: He is oriented to person, place, and time. He appears well-developed and well-nourished.  HENT:  Head: Normocephalic and atraumatic.  Right Ear: External ear normal.  Left Ear: External ear normal.  Nose: Nose normal.  Mouth/Throat: Oropharynx is clear and moist.  Eyes: Conjunctivae are normal. No scleral icterus.  Neck: No thyromegaly present.  Cardiovascular: Normal rate, regular rhythm and normal heart sounds.  Pulmonary/Chest: Effort normal and breath sounds normal.  Abdominal: Soft.  Musculoskeletal: He exhibits no edema.  Neurological: He is alert and oriented to person, place, and time.  Skin: Skin is warm and dry.  Psychiatric: He has a normal mood and affect. His behavior is normal. Judgment and thought content normal.        Assessment & Plan:     1. Type 2 diabetes mellitus without complication, without long-term current use of insulin (HCC) Good control - POCT glycosylated hemoglobin (Hb A1C)--6.8       [name], MD    [facility]

## 2017-10-19 ENCOUNTER — Other Ambulatory Visit: Payer: Self-pay | Admitting: Family Medicine

## 2017-10-20 NOTE — Telephone Encounter (Signed)
Pharmacy requesting refills. Thanks!  

## 2017-11-30 ENCOUNTER — Other Ambulatory Visit: Payer: Self-pay | Admitting: Family Medicine

## 2017-11-30 DIAGNOSIS — I10 Essential (primary) hypertension: Secondary | ICD-10-CM

## 2017-12-09 ENCOUNTER — Other Ambulatory Visit: Payer: Self-pay | Admitting: Family Medicine

## 2018-01-03 ENCOUNTER — Ambulatory Visit (INDEPENDENT_AMBULATORY_CARE_PROVIDER_SITE_OTHER): Payer: Medicare PPO

## 2018-01-03 DIAGNOSIS — Z23 Encounter for immunization: Secondary | ICD-10-CM

## 2018-02-07 IMAGING — CR DG KNEE COMPLETE 4+V*L*
1 series · 4 of 4 positions shown · non-contrast
Comparison: None.

CLINICAL DATA: Pain for 5 years.  No known injury.

EXAM:
LEFT KNEE - COMPLETE 4+ VIEW

[Series 1: dg knee complete 4 views left · 0.14mm/px · 4 of 4 slices shown]
[im 1/4]
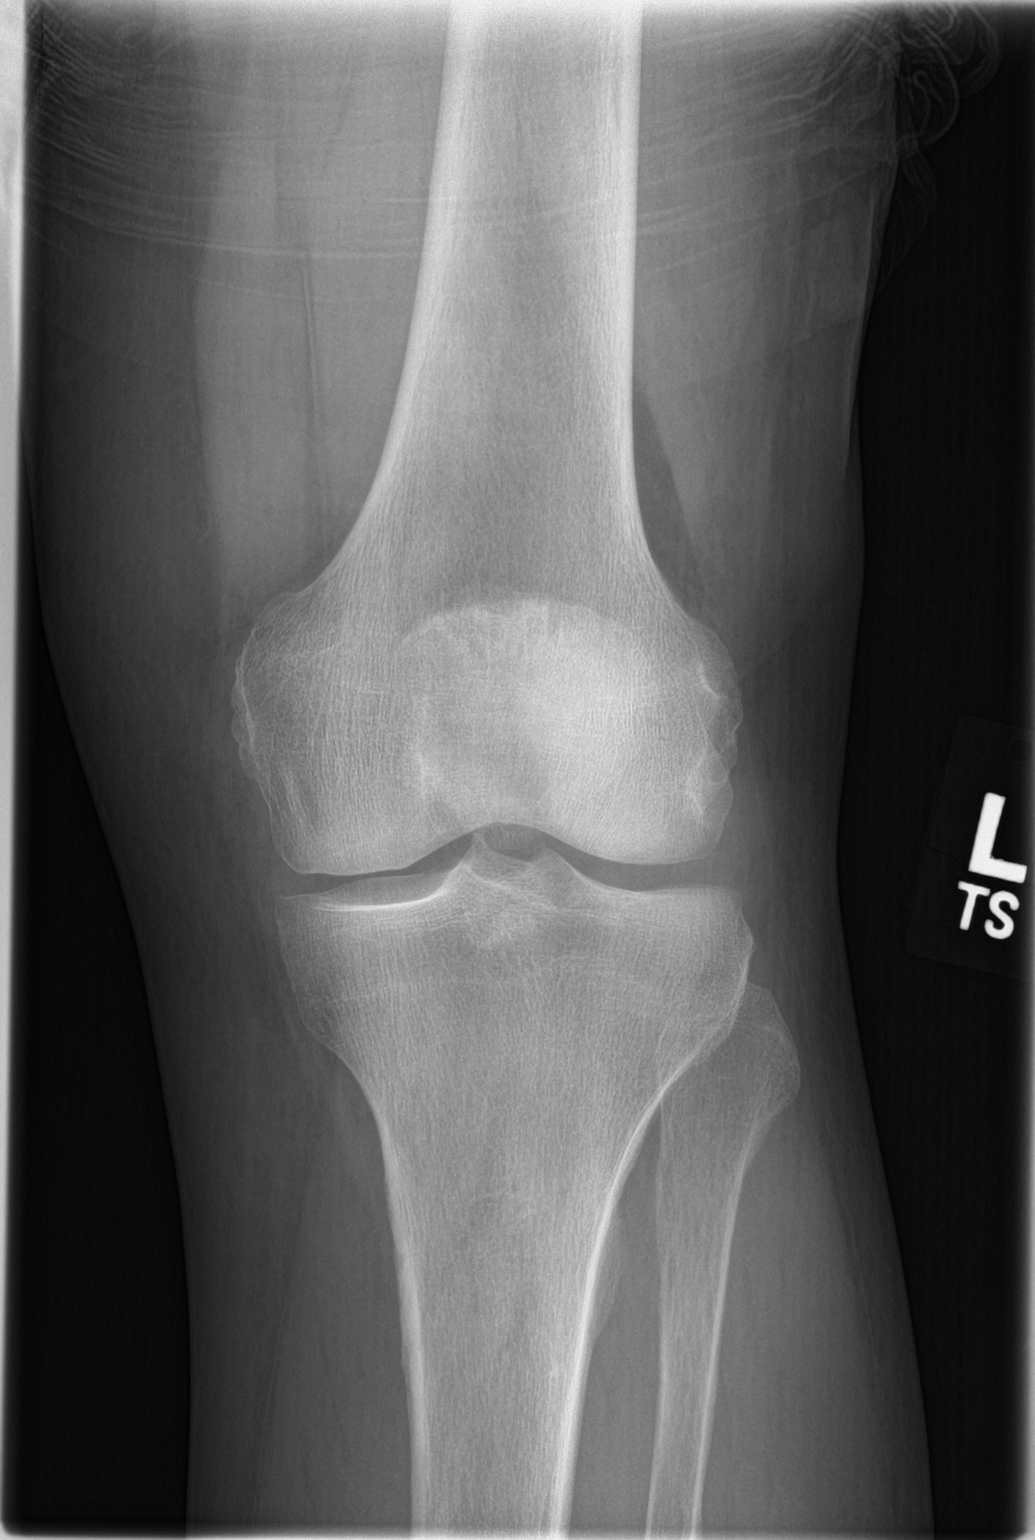
[im 2/4]
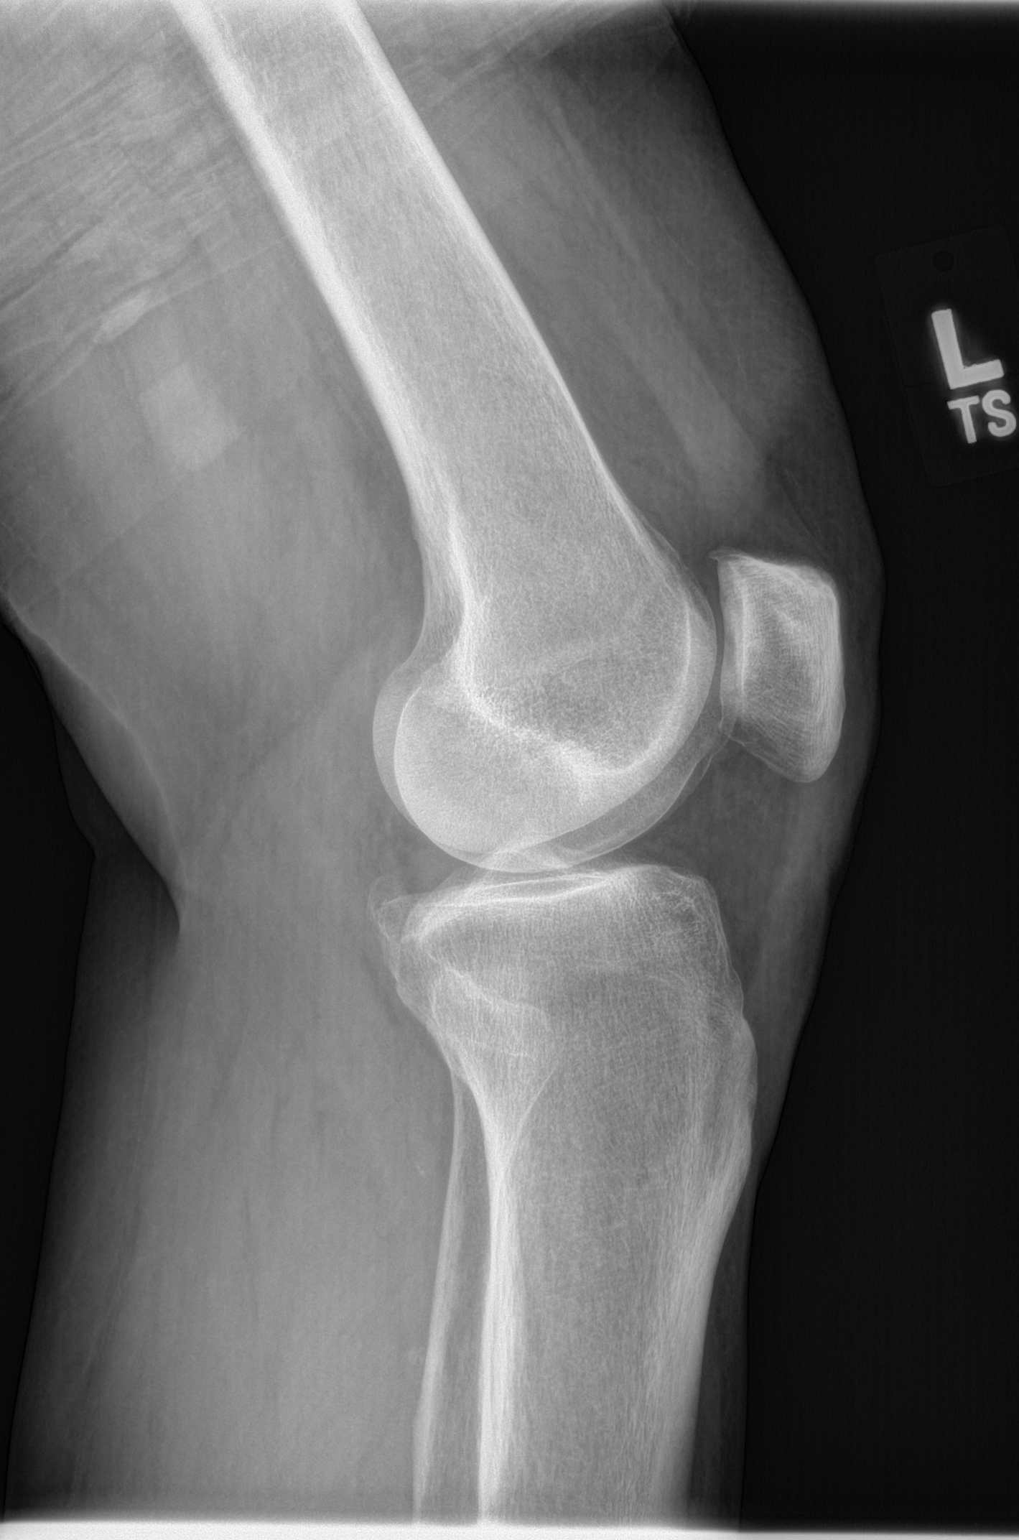
[im 3/4]
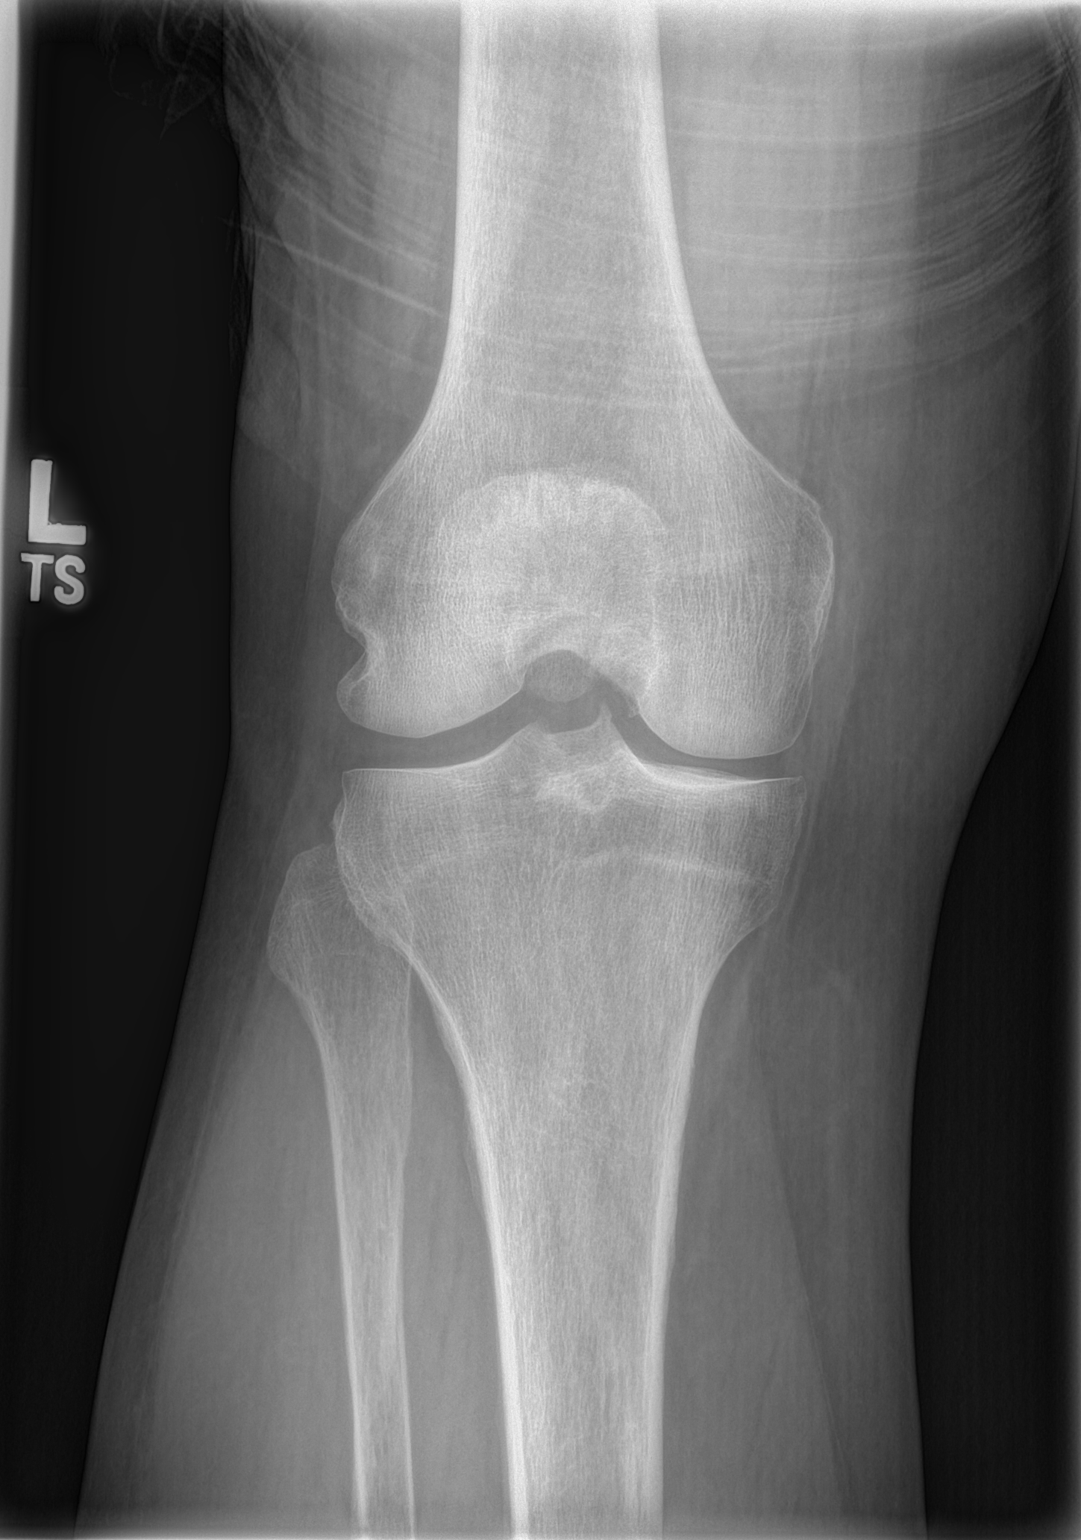
[im 4/4]
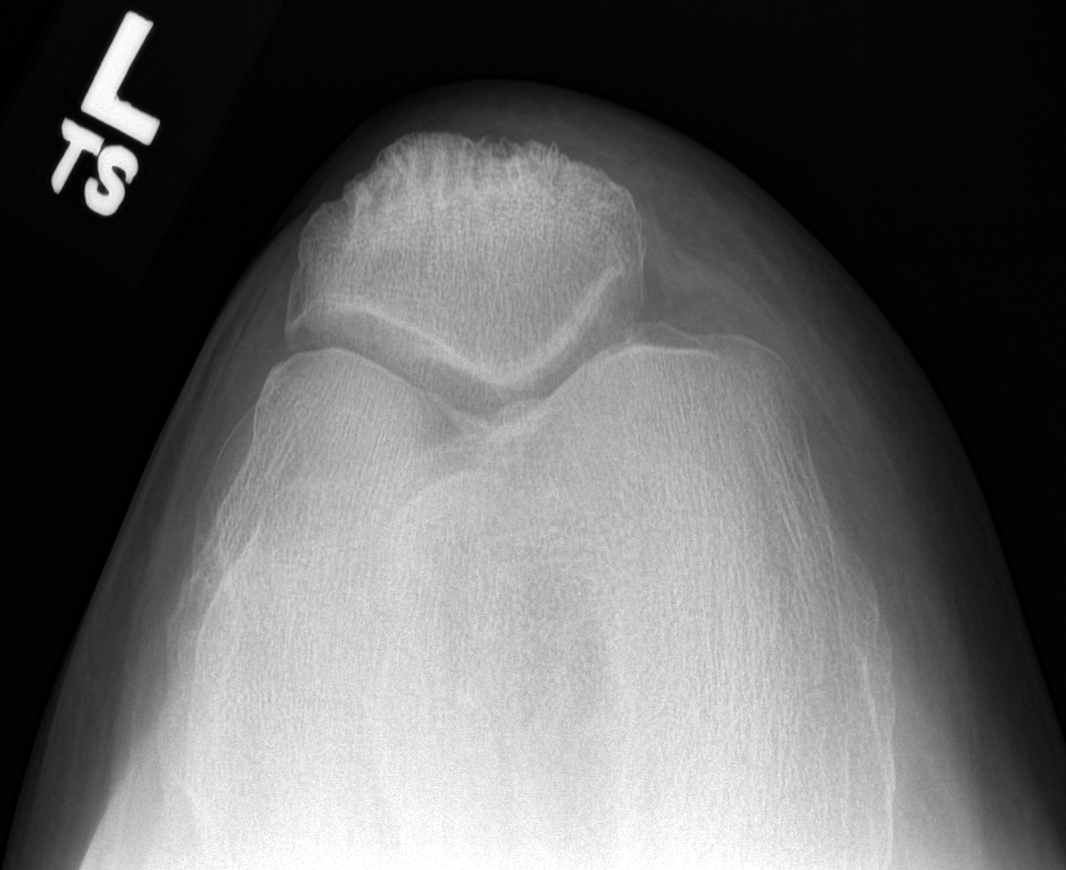

[4 of 4 positions shown; findings below may reference images not displayed]

FINDINGS: No acute fracture dislocation. No lytic or sclerotic osseous lesion.
Minimal joint space narrowing of the medial femorotibial
compartment. Patellofemoral compartment joint space is relatively
well maintained. No significant joint effusion.
IMPRESSION: Minimal osteoarthritis of the medial femorotibial compartment of the
left knee.

## 2018-02-07 IMAGING — CR DG KNEE COMPLETE 4+V*R*
1 series · 4 of 4 positions shown · non-contrast
Comparison: None.

CLINICAL DATA: Pain for 5 years

EXAM:
RIGHT KNEE - COMPLETE 4+ VIEW

[Series 1: dg knee complete 4 views right · 0.14mm/px · 4 of 4 slices shown]
[im 1/4]
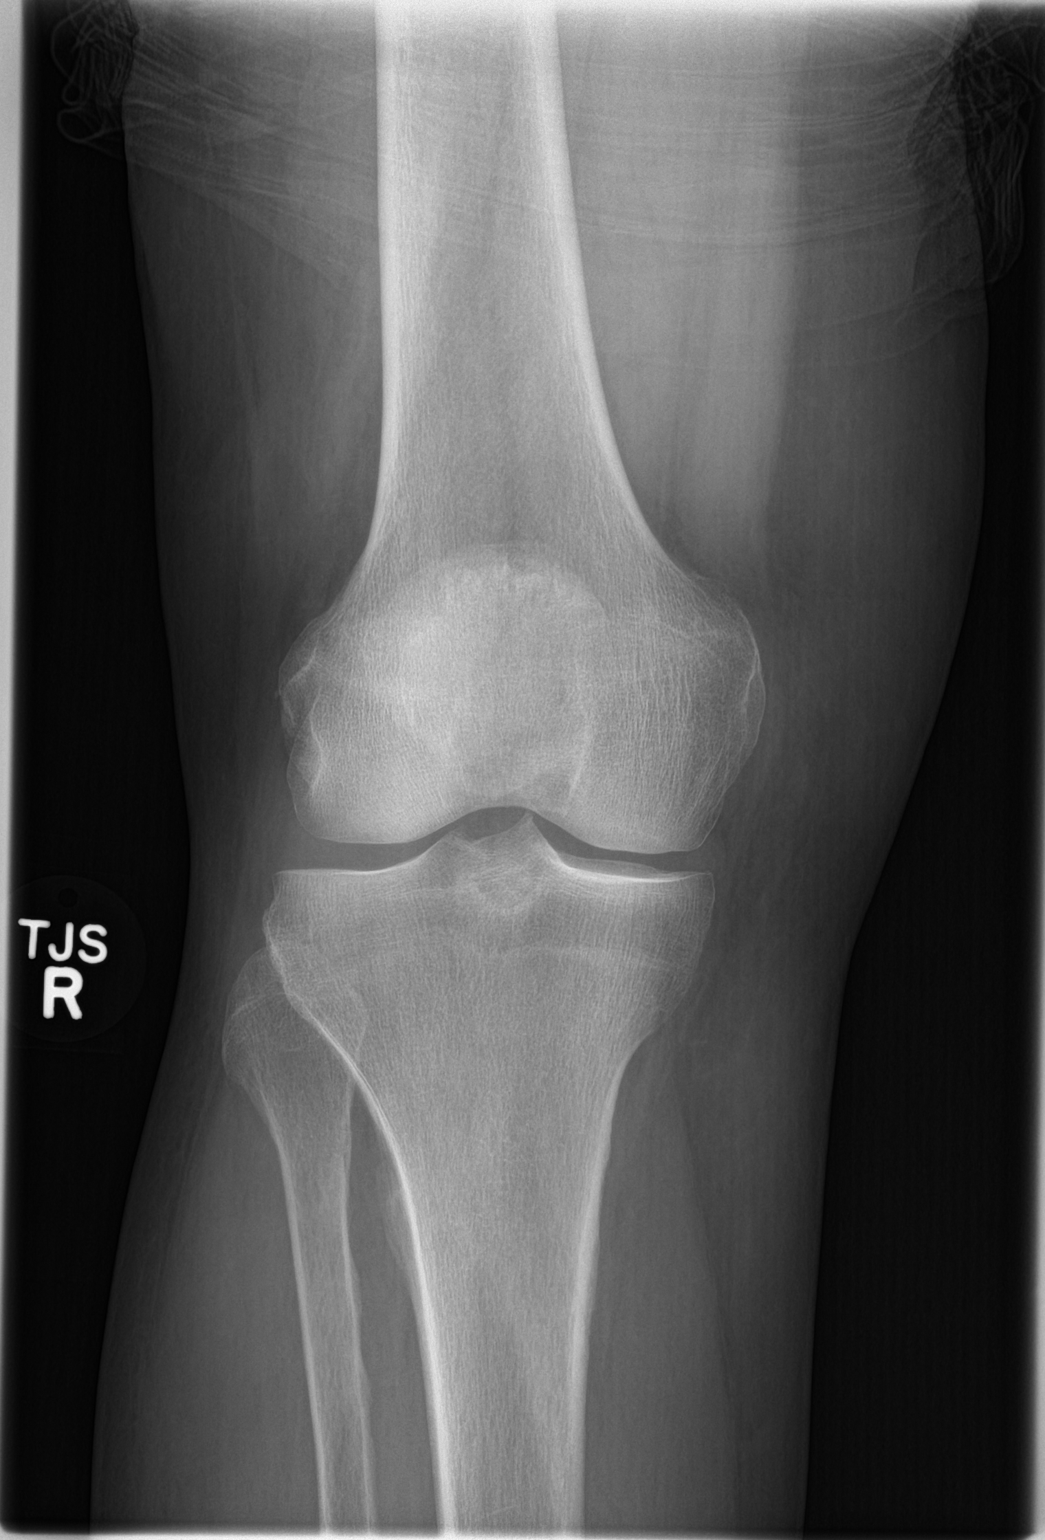
[im 2/4]
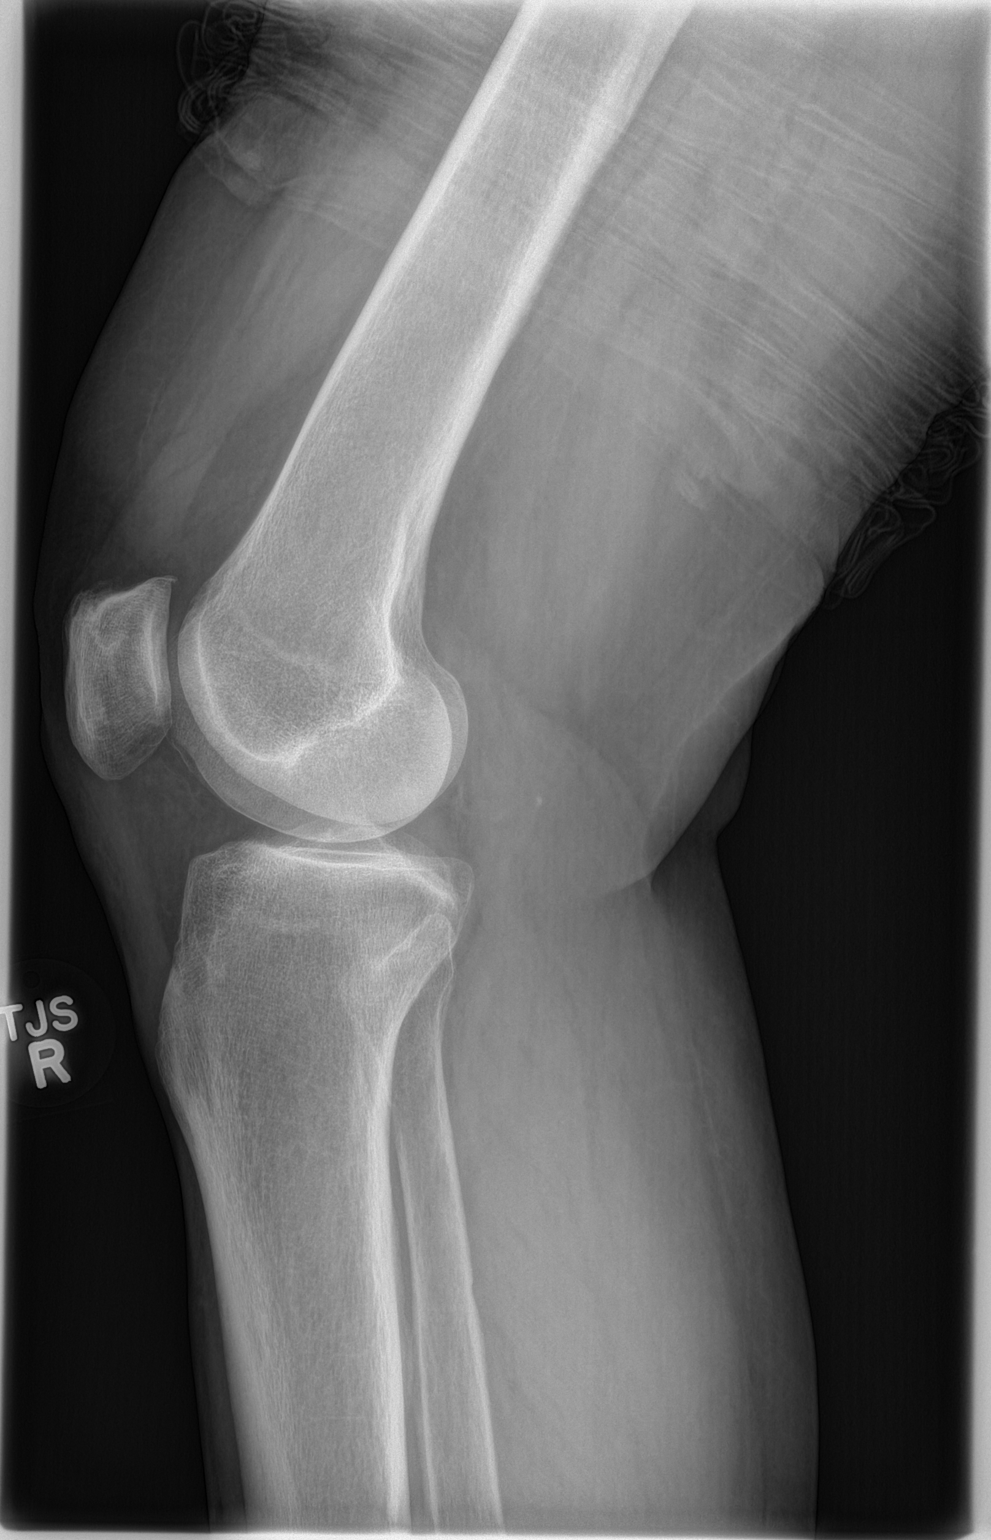
[im 3/4]
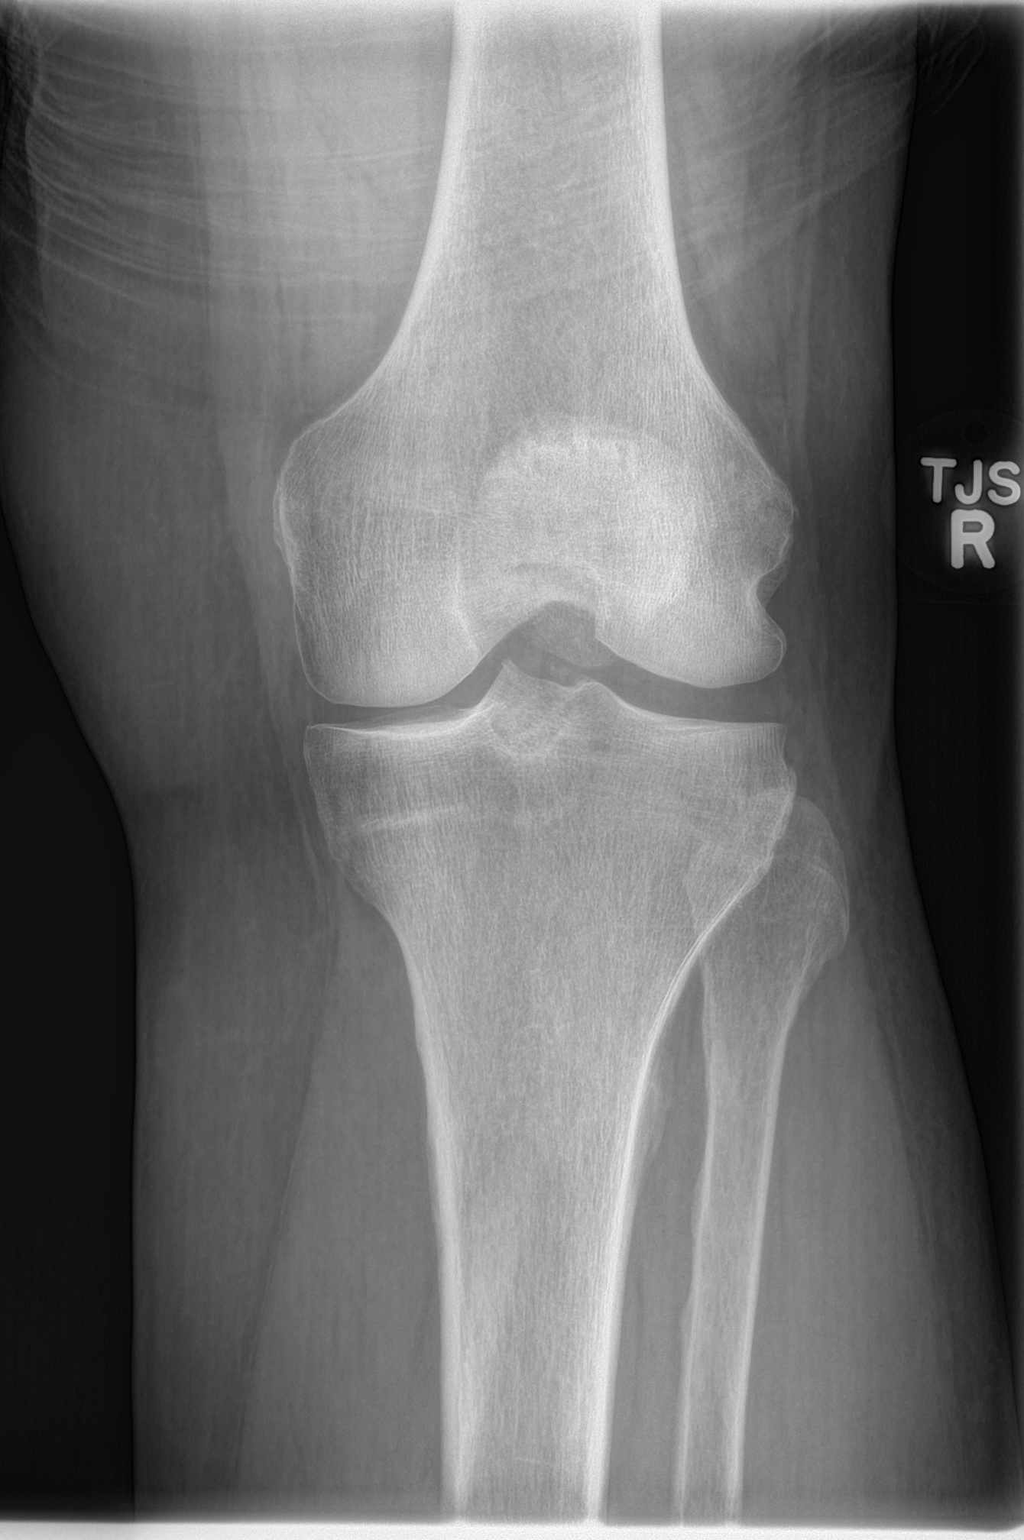
[im 4/4]
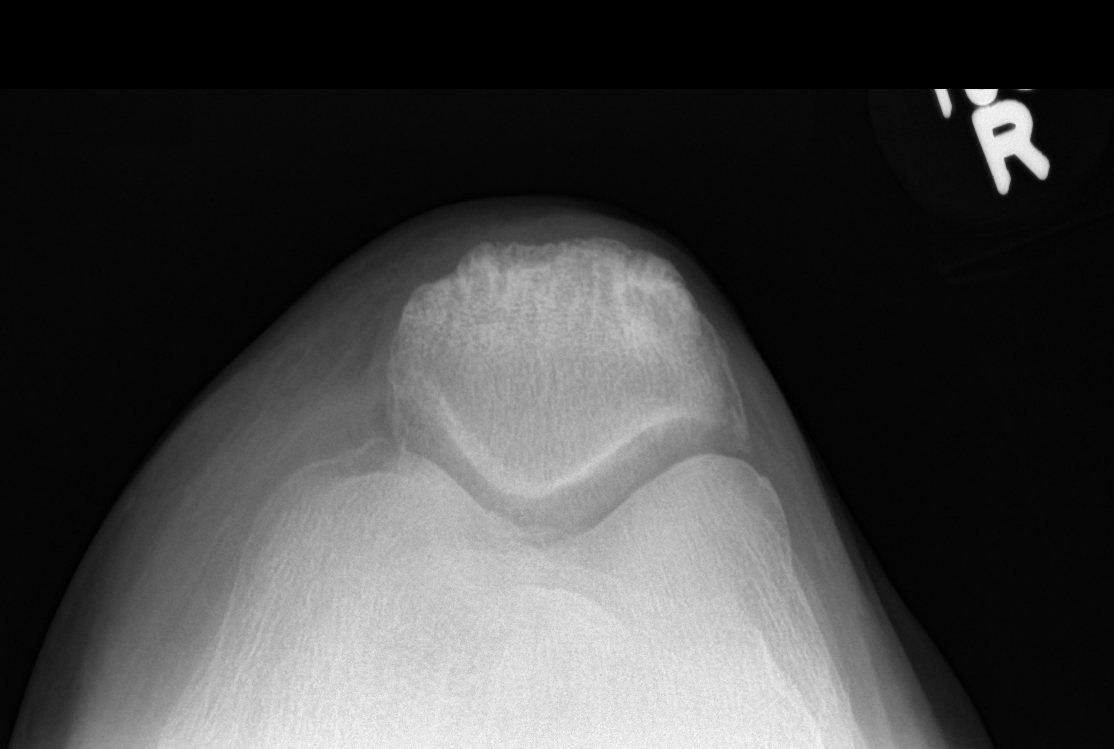

[4 of 4 positions shown; findings below may reference images not displayed]

FINDINGS: Frontal, tunnel, lateral, and sunrise patellar images were obtained.
There is no fracture or dislocation. Joint spaces appear normal.
There is slight narrowing medially and in the patellofemoral joint
regions. There is slight spurring along the anterior and posterior
aspects of the patella superiorly. No erosive change.
IMPRESSION: Relatively mild joint space narrowing medially and in the
patellofemoral joint regions. Slight patellar spurring. No fracture
or joint effusion.

## 2018-02-14 ENCOUNTER — Ambulatory Visit (INDEPENDENT_AMBULATORY_CARE_PROVIDER_SITE_OTHER): Payer: Medicare PPO | Admitting: Family Medicine

## 2018-02-14 VITALS — BP 122/60 | HR 68 | Temp 98.4°F | Resp 16 | Wt 208.0 lb

## 2018-02-14 DIAGNOSIS — E119 Type 2 diabetes mellitus without complications: Secondary | ICD-10-CM

## 2018-02-14 DIAGNOSIS — I10 Essential (primary) hypertension: Secondary | ICD-10-CM

## 2018-02-14 DIAGNOSIS — I25810 Atherosclerosis of coronary artery bypass graft(s) without angina pectoris: Secondary | ICD-10-CM

## 2018-02-14 DIAGNOSIS — E7800 Pure hypercholesterolemia, unspecified: Secondary | ICD-10-CM

## 2018-02-14 LAB — POCT GLYCOSYLATED HEMOGLOBIN (HGB A1C): Hemoglobin A1C: 6.7 % — AB (ref 4.0–5.6)

## 2018-02-14 NOTE — Progress Notes (Signed)
Todd Olson  MRN: [id number] DOB: [birth year]  Subjective:  HPI   The patient is a 77 year old male who presents for follow up of his diabetes.  He was last seen on 10/11/17.  At that time his A1C was 6.8.  The patient has been checking his glucose at home and it has been running in the range of 117-140 usually.      Patient Active Problem List   Diagnosis Date Noted  . Arteriosclerosis of nonautologous coronary artery bypass graft 09/10/2014  . Back pain, chronic 09/10/2014  . Diabetes mellitus, type 2 ([place]) 09/10/2014  . Well controlled diabetes mellitus ([place]) 09/10/2014  . Hemorrhoid 09/10/2014  . HLD (hyperlipidemia) 09/10/2014  . BP (high blood pressure) 09/10/2014  . Fatty tumor 09/10/2014  . Absolute anemia 09/10/2014  . Muscle ache 09/10/2014  . Arthritis of hand, degenerative 09/10/2014  . Benign neoplasm of colon 09/10/2014  . Rotator cuff syndrome 09/10/2014  . Head revolving around 09/10/2014  . Adynamia 09/10/2014    Past Medical History:  Diagnosis Date  . Anemia   . CHF (congestive heart failure) ([place])   . Coronary artery disease   . Diabetes mellitus without complication ([place])   . GI bleeding    upper and lower  . History of hiatal hernia   . Hypertension   . Myocardial infarction ([place])    X 4 LAST 2003    Social History   Socioeconomic History  . Marital status: Married    Spouse name: Not on file  . Number of children: 3  . Years of education: Not on file  . Highest education level: Associate degree: occupational, Hotel manager, or vocational program  Occupational History  . Occupation: retired  Scientific laboratory technician  . Financial resource strain: Not hard at all  . Food insecurity:    Worry: Never true    Inability: Never true  . Transportation needs:    Medical: No    Non-medical: No  Tobacco Use  . Smoking status: Former Smoker    Last attempt to quit: 11/17/1977    Years since quitting: 40.2  . Smokeless tobacco: Never Used  Substance and  Sexual Activity  . Alcohol use: No    Alcohol/week: 0.0 standard drinks  . Drug use: No  . Sexual activity: Not Currently  Lifestyle  . Physical activity:    Days per week: Not on file    Minutes per session: Not on file  . Stress: Only a little  Relationships  . Social connections:    Talks on phone: Not on file    Gets together: Not on file    Attends religious service: Not on file    Active member of club or organization: Not on file    Attends meetings of clubs or organizations: Not on file    Relationship status: Not on file  . Intimate partner violence:    Fear of current or ex partner: No    Emotionally abused: No    Physically abused: No    Forced sexual activity: No  Other Topics Concern  . Not on file  Social History Narrative  . Not on file    Outpatient Encounter Medications as of 02/14/2018  Medication Sig  . amLODipine (NORVASC) 5 MG tablet TAKE 1 TABLET BY MOUTH ONCE DAILY  . aspirin 81 MG tablet Take 81 mg by mouth every other day. At night  . Carboxymeth-Glycerin-Polysorb (REFRESH OPTIVE MEGA-3 OP) Place 1 drop into both eyes daily  as needed (dry eyes).  . Cholecalciferol 1000 UNITS capsule Take 1,000 Units by mouth daily.   . clopidogrel (PLAVIX) 75 MG tablet TAKE 1 TABLET BY MOUTH ONCE DAILY  . ferrous sulfate 325 (65 FE) MG tablet Take 325 mg by mouth daily.  . furosemide (LASIX) 40 MG tablet TAKE 1 TABLET BY MOUTH ONCE DAILY  . Glucosamine-Chondroit-Vit C-Mn (GLUCOSAMINE CHONDR 1500 COMPLX) CAPS Take 1 capsule by mouth 2 (two) times daily.   Marland Kitchen glucose blood (ONE TOUCH ULTRA TEST) test strip USE TO CHECK BLOOD SUGAR TWICE DAILY  . HYDROcodone-acetaminophen (NORCO) 5-325 MG tablet Take 1-2 tablets by mouth every 6 (six) hours as needed for moderate pain. MAXIMUM TOTAL ACETAMINOPHEN DOSE IS 4000 MG PER DAY  . lisinopril-hydrochlorothiazide (PRINZIDE,ZESTORETIC) 20-12.5 MG tablet TAKE 1 TABLET BY MOUTH ONCE DAILY  . lovastatin (MEVACOR) 40 MG tablet TAKE ONE  TABLET BY MOUTH AT BEDTIME  . magnesium oxide (MAG-OX) 400 (241.3 MG) MG tablet Take 400 mg by mouth 2 (two) times daily.   . metFORMIN (GLUCOPHAGE) 1000 MG tablet TAKE 1 TABLET BY MOUTH TWICE DAILY WITH A MEAL  . metoprolol tartrate (LOPRESSOR) 25 MG tablet TAKE 1 TABLET BY MOUTH TWICE DAILY  . Omega-3 Fatty Acids (FISH OIL) 1000 MG CAPS Take 525 mg by mouth daily.   . ondansetron (ZOFRAN) 4 MG tablet Take 1 tablet (4 mg total) by mouth every 8 (eight) hours as needed for nausea or vomiting.  . pioglitazone (ACTOS) 30 MG tablet Take 1 tablet (30 mg total) by mouth daily.   No facility-administered encounter medications on file as of 02/14/2018.     Allergies  Allergen Reactions  . Oxycodone Other (See Comments)    hallucination  . Penicillins Rash    Rash as a child.  Has not taken since then Has patient had a PCN reaction causing immediate rash, facial/tongue/throat swelling, SOB or lightheadedness with hypotension: Yes Has patient had a PCN reaction causing severe rash involving mucus membranes or skin necrosis: No Has patient had a PCN reaction that required hospitalization: No Has patient had a PCN reaction occurring within the last 10 years: No If all of the above answers are "NO", then may proceed with Cephalosporin use.     Review of Systems  Constitutional: Negative for fever and malaise/fatigue.  HENT: Negative.   Respiratory: Negative for cough, shortness of breath and wheezing.   Cardiovascular: Negative for chest pain, palpitations, orthopnea, claudication and leg swelling.  Gastrointestinal: Negative.   Genitourinary: Negative.   Skin: Negative.   Neurological: Negative.   Endo/Heme/Allergies: Negative.   Psychiatric/Behavioral: Negative.     Objective:  BP 122/60 (BP Location: Right Arm, Patient Position: Sitting, Cuff Size: Normal)   Pulse 68   Temp 98.4 F (36.9 C) (Oral)   Resp 16   Wt 208 lb (94.3 kg)   BMI 31.63 kg/m   Physical Exam  Constitutional:  He is oriented to person, place, and time and well-developed, well-nourished, and in no distress.  HENT:  Head: Normocephalic and atraumatic.  Eyes: Conjunctivae are normal. No scleral icterus.  Neck: No thyromegaly present.  Cardiovascular: Normal rate, regular rhythm, normal heart sounds and intact distal pulses.  Pulmonary/Chest: Effort normal and breath sounds normal.  Abdominal: Soft.  Musculoskeletal: He exhibits no edema.  Neurological: He is alert and oriented to person, place, and time. Gait normal. GCS score is 15.  Skin: Skin is warm and dry.  Psychiatric: Mood, memory, affect and judgment normal.    Assessment  and Plan :  1. Type 2 diabetes mellitus without complication, without long-term current use of insulin (HCC) Controlled. - POCT glycosylated hemoglobin (Hb A1C)--6.7 today.  2. Arteriosclerosis of nonautologous coronary artery bypass graft Stable--all risk factors treated.  3. Pure hypercholesterolemia   4. Essential hypertension RTC 4-5 months.  I have done the exam and reviewed the chart and it is accurate to the best of my knowledge. Development worker, community has been used and  any errors in dictation or transcription are unintentional. Miguel Aschoff M.D. [facility]

## 2018-02-23 ENCOUNTER — Other Ambulatory Visit: Payer: Self-pay | Admitting: Family Medicine

## 2018-03-24 ENCOUNTER — Ambulatory Visit (INDEPENDENT_AMBULATORY_CARE_PROVIDER_SITE_OTHER): Payer: Medicare PPO | Admitting: Family Medicine

## 2018-03-24 ENCOUNTER — Encounter: Payer: Self-pay | Admitting: Family Medicine

## 2018-03-24 VITALS — BP 120/64 | HR 94 | Temp 98.2°F | Resp 16 | Wt 209.4 lb

## 2018-03-24 DIAGNOSIS — M109 Gout, unspecified: Secondary | ICD-10-CM | POA: Diagnosis not present

## 2018-03-24 MED ORDER — PREDNISONE 20 MG PO TABS: ORAL_TABLET | ORAL | 0 refills | Status: DC

## 2018-03-24 NOTE — Progress Notes (Signed)
  Subjective:     Patient ID: Todd Olson, male   DOB: [birth year], 77 y.o.   MRN: [id number] Chief Complaint  Patient presents with  . Foot Pain    Patient comes in office today with concerns of pain and swelling in his left foot for the past 2 days.    HPI Reports prior hx of gout in this same left great toe 3 years ago. Unknown trigger. No ETOH use or foot injury. He has taken Advil and took a cherry extract pill.  Review of Systems     Objective:   Physical Exam  Constitutional: He appears well-developed and well-nourished. No distress.  Musculoskeletal:  Left first MTJ joint with mild erythema/swelling but has exquisite tenderness on palpation.       Assessment:    1. Gouty arthritis of left great toe: Prednisone - Renal function panel - Sedimentation rate - Uric acid    Plan:    Gout handout provided. Further f/u pending lab results.

## 2018-03-24 NOTE — Patient Instructions (Signed)
Please get your labs next week. Gout Gout is painful swelling that can happen in some of your joints. Gout is a type of arthritis. This condition is caused by having too much uric acid in your body. Uric acid is a chemical that is made when your body breaks down substances called purines. If your body has too much uric acid, sharp crystals can form and build up in your joints. This causes pain and swelling. Gout attacks can happen quickly and be very painful (acute gout). Over time, the attacks can affect more joints and happen more often (chronic gout). Follow these instructions at home: During a Gout Attack  If directed, put ice on the painful area: ? Put ice in a plastic bag. ? Place a towel between your skin and the bag. ? Leave the ice on for 20 minutes, 2-3 times a day.  Rest the joint as much as possible. If the joint is in your leg, you may be given crutches to use.  Raise (elevate) the painful joint above the level of your heart as often as you can.  Drink enough fluids to keep your pee (urine) clear or pale yellow.  Take over-the-counter and prescription medicines only as told by your doctor.  Do not drive or use heavy machinery while taking prescription pain medicine.  Follow instructions from your doctor about what you can or cannot eat and drink.  Return to your normal activities as told by your doctor. Ask your doctor what activities are safe for you. Avoiding Future Gout Attacks  Follow a low-purine diet as told by a specialist (dietitian) or your doctor. Avoid foods and drinks that have a lot of purines, such as: ? Liver. ? Kidney. ? Anchovies. ? Asparagus. ? Herring. ? Mushrooms ? Mussels. ? Beer.  Limit alcohol intake to no more than 1 drink a day for nonpregnant women and 2 drinks a day for men. One drink equals 12 oz of beer, 5 oz of wine, or 1 oz of hard liquor.  Stay at a healthy weight or lose weight if you are overweight. If you want to lose weight,  talk with your doctor. It is important that you do not lose weight too fast.  Start or continue an exercise plan as told by your doctor.  Drink enough fluids to keep your pee clear or pale yellow.  Take over-the-counter and prescription medicines only as told by your doctor.  Keep all follow-up visits as told by your doctor. This is important. Contact a doctor if:  You have another gout attack.  You still have symptoms of a gout attack after10 days of treatment.  You have problems (side effects) because of your medicines.  You have chills or a fever.  You have burning pain when you pee (urinate).  You have pain in your lower back or belly. Get help right away if:  You have very bad pain.  Your pain cannot be controlled.  You cannot pee. This information is not intended to replace advice given to you by your health care provider. Make sure you discuss any questions you have with your health care provider. Document Released: 01/26/2008 Document Revised: 09/24/2015 Document Reviewed: 01/29/2015 Elsevier Interactive Patient Education  [employer].

## 2018-03-28 ENCOUNTER — Telehealth: Payer: Self-pay

## 2018-03-28 LAB — RENAL FUNCTION PANEL
Albumin: 4.4 g/dL (ref 3.5–4.8)
BUN/Creatinine Ratio: 24 (ref 10–24)
BUN: 27 mg/dL (ref 8–27)
CO2: 21 mmol/L (ref 20–29)
CREATININE: 1.12 mg/dL (ref 0.76–1.27)
Calcium: 9.5 mg/dL (ref 8.6–10.2)
Chloride: 102 mmol/L (ref 96–106)
GFR, EST AFRICAN AMERICAN: 74 mL/min/{1.73_m2} (ref >59)
GFR, EST NON AFRICAN AMERICAN: 64 mL/min/{1.73_m2} (ref >59)
Glucose: 179 mg/dL — ABNORMAL HIGH (ref 65–99)
Phosphorus: 3.1 mg/dL (ref 2.5–4.5)
Potassium: 4.2 mmol/L (ref 3.5–5.2)
Sodium: 142 mmol/L (ref 134–144)

## 2018-03-28 LAB — URIC ACID: URIC ACID: 6.8 mg/dL (ref 3.7–8.6)

## 2018-03-28 LAB — SEDIMENTATION RATE: Sed Rate: 4 mm/hr (ref 0–30)

## 2018-03-28 NOTE — Telephone Encounter (Signed)
-----   Message from Carmon Ginsberg, [place] sent at 03/28/2018  7:22 AM EST ----- Labs ok. If you have recurrent episodes of gout may desire to lower uric acid lower with a daily medication.

## 2018-03-28 NOTE — Telephone Encounter (Signed)
Patient advised.KW 

## 2018-04-19 ENCOUNTER — Telehealth: Payer: Self-pay

## 2018-04-19 MED ORDER — PREDNISONE 10 MG PO TABS: ORAL_TABLET | ORAL | 0 refills | Status: DC

## 2018-04-19 NOTE — Telephone Encounter (Signed)
Patients wife had called the office requesting prescription for patient, she states that he is having a gout flare and was seen in office by Mikki Santee for this issue on 03/24/18. Pharmacy she request prescription be sent to is [facility] on [address]. Patient can be reached on home number if you have any further questions. KW

## 2018-04-19 NOTE — Telephone Encounter (Signed)
Looks like patient was Rx'd Prednisone for symptoms. Please review. Thanks!

## 2018-04-19 NOTE — Telephone Encounter (Signed)
Done

## 2018-04-19 NOTE — Telephone Encounter (Signed)
6 day

## 2018-06-13 ENCOUNTER — Ambulatory Visit (INDEPENDENT_AMBULATORY_CARE_PROVIDER_SITE_OTHER): Payer: Medicare Other | Admitting: Family Medicine

## 2018-06-13 ENCOUNTER — Encounter: Payer: Self-pay | Admitting: Family Medicine

## 2018-06-13 VITALS — BP 122/68 | HR 72 | Temp 99.0°F | Resp 16 | Wt 206.0 lb

## 2018-06-13 DIAGNOSIS — E119 Type 2 diabetes mellitus without complications: Secondary | ICD-10-CM | POA: Diagnosis not present

## 2018-06-13 DIAGNOSIS — M109 Gout, unspecified: Secondary | ICD-10-CM

## 2018-06-13 DIAGNOSIS — E7800 Pure hypercholesterolemia, unspecified: Secondary | ICD-10-CM | POA: Diagnosis not present

## 2018-06-13 DIAGNOSIS — I25810 Atherosclerosis of coronary artery bypass graft(s) without angina pectoris: Secondary | ICD-10-CM

## 2018-06-13 MED ORDER — ALLOPURINOL 100 MG PO TABS: 100.0000 mg | ORAL_TABLET | Freq: Every day | ORAL | 6 refills | Status: DC

## 2018-06-13 MED ORDER — COLCHICINE 0.6 MG PO TABS: 0.6000 mg | ORAL_TABLET | Freq: Two times a day (BID) | ORAL | 11 refills | Status: DC

## 2018-06-13 MED ORDER — PREDNISONE 10 MG (21) PO TBPK: ORAL_TABLET | ORAL | 1 refills | Status: DC

## 2018-06-13 NOTE — Patient Instructions (Signed)
Start Lovastatin every other day.

## 2018-06-13 NOTE — Progress Notes (Signed)
Patient: Todd Olson Male    DOB: Sep 28, 1940   78 y.o.   MRN: [id number] Visit Date: 06/13/2018  Today's Provider: [name], MD   Chief Complaint  Patient presents with  . Gout   Subjective:     HPI   Patient comes in today c/o gout in his left great toe for the last 2 days. He reports that this is his 3rd attack in 3 months. He was last seen in the office 2 months ago with similar symptoms, and he was prescribed a prednisone taper. He reports that it helped his symptoms, but made his blood sugars elevated.    Allergies  Allergen Reactions  . Oxycodone Other (See Comments)    hallucination  . Penicillins Rash    Rash as a child.  Has not taken since then Has patient had a PCN reaction causing immediate rash, facial/tongue/throat swelling, SOB or lightheadedness with hypotension: Yes Has patient had a PCN reaction causing severe rash involving mucus membranes or skin necrosis: No Has patient had a PCN reaction that required hospitalization: No Has patient had a PCN reaction occurring within the last 10 years: No If all of the above answers are "NO", then may proceed with Cephalosporin use.      Current Outpatient Medications:  .  amLODipine (NORVASC) 5 MG tablet, TAKE 1 TABLET BY MOUTH ONCE DAILY, Disp: 30 tablet, Rfl: 12 .  aspirin 81 MG tablet, Take 81 mg by mouth every other day. At night, Disp: , Rfl:  .  Cholecalciferol 1000 UNITS capsule, Take 1,000 Units by mouth daily. , Disp: , Rfl:  .  clopidogrel (PLAVIX) 75 MG tablet, TAKE 1 TABLET BY MOUTH ONCE DAILY, Disp: 30 tablet, Rfl: 12 .  ferrous sulfate 325 (65 FE) MG tablet, Take 325 mg by mouth daily., Disp: , Rfl:  .  furosemide (LASIX) 40 MG tablet, TAKE 1 TABLET BY MOUTH ONCE DAILY, Disp: 90 tablet, Rfl: 3 .  Glucosamine-Chondroit-Vit C-Mn (GLUCOSAMINE CHONDR 1500 COMPLX) CAPS, Take 1 capsule by mouth 2 (two) times daily. , Disp: , Rfl:  .  glucose blood (ONE TOUCH ULTRA TEST) test strip, USE TO  CHECK BLOOD SUGAR TWICE DAILY, Disp: 100 each, Rfl: 12 .  lisinopril-hydrochlorothiazide (PRINZIDE,ZESTORETIC) 20-12.5 MG tablet, TAKE 1 TABLET BY MOUTH ONCE DAILY, Disp: 90 tablet, Rfl: 3 .  lovastatin (MEVACOR) 40 MG tablet, TAKE 1 TABLET BY MOUTH AT BEDTIME, Disp: 90 tablet, Rfl: 3 .  magnesium oxide (MAG-OX) 400 (241.3 MG) MG tablet, Take 400 mg by mouth 2 (two) times daily. , Disp: , Rfl:  .  metFORMIN (GLUCOPHAGE) 1000 MG tablet, TAKE 1 TABLET BY MOUTH TWICE DAILY WITH A MEAL, Disp: 180 tablet, Rfl: 3 .  metoprolol tartrate (LOPRESSOR) 25 MG tablet, TAKE 1 TABLET BY MOUTH TWICE DAILY, Disp: 180 tablet, Rfl: 3 .  Omega-3 Fatty Acids (FISH OIL) 1000 MG CAPS, Take 525 mg by mouth daily. , Disp: , Rfl:  .  ondansetron (ZOFRAN) 4 MG tablet, Take 1 tablet (4 mg total) by mouth every 8 (eight) hours as needed for nausea or vomiting., Disp: 30 tablet, Rfl: 0 .  pioglitazone (ACTOS) 30 MG tablet, Take 1 tablet (30 mg total) by mouth daily., Disp: 90 tablet, Rfl: 3 .  predniSONE (DELTASONE) 10 MG tablet, Take 6 pills on day 1 the decrease by 1 pill each day until gone (Patient not taking: Reported on 06/13/2018), Disp: 21 tablet, Rfl: 0  Review of Systems  Constitutional: Negative for activity change, appetite change, chills, diaphoresis, fatigue, fever and unexpected weight change.  HENT: Negative.   Eyes: Negative.   Respiratory: Negative.   Cardiovascular: Negative for leg swelling.  Gastrointestinal: Negative.   Endocrine: Negative.   Musculoskeletal: Positive for arthralgias and gait problem.  Skin: Positive for color change. Negative for pallor, rash and wound.  Allergic/Immunologic: Negative.   Hematological: Negative.   Psychiatric/Behavioral: Negative.     Social History   Tobacco Use  . Smoking status: Former Smoker    Last attempt to quit: 11/17/1977    Years since quitting: 40.5  . Smokeless tobacco: Never Used  Substance Use Topics  . Alcohol use: No    Alcohol/week: 0.0  standard drinks      Objective:   BP 122/68 (BP Location: Left Arm, Patient Position: Sitting, Cuff Size: Normal)   Pulse 72   Temp 99 F (37.2 C)   Resp 16   Wt 206 lb (93.4 kg)   SpO2 96%   BMI 31.32 kg/m  Vitals:   06/13/18 1435  BP: 122/68  Pulse: 72  Resp: 16  Temp: 99 F (37.2 C)  SpO2: 96%  Weight: 206 lb (93.4 kg)     Physical Exam Vitals signs reviewed.  Constitutional:      Appearance: He is well-developed.  HENT:     Head: Normocephalic and atraumatic.     Right Ear: External ear normal.     Left Ear: External ear normal.     Nose: Nose normal.  Eyes:     General: No scleral icterus.    Conjunctiva/sclera: Conjunctivae normal.  Neck:     Thyroid: No thyromegaly.  Cardiovascular:     Rate and Rhythm: Normal rate and regular rhythm.     Heart sounds: Normal heart sounds.  Pulmonary:     Effort: Pulmonary effort is normal.     Breath sounds: Normal breath sounds.  Abdominal:     Palpations: Abdomen is soft.  Musculoskeletal:        General: Swelling and tenderness present.     Comments: Mild swelling and tenderness of the left great toe and MCP area.  Consistent with gout  Skin:    General: Skin is warm and dry.  Neurological:     Mental Status: He is alert and oriented to person, place, and time.  Psychiatric:        Mood and Affect: Mood normal.        Behavior: Behavior normal.        Thought Content: Thought content normal.        Judgment: Judgment normal.         Assessment & Plan    1. Gouty arthritis of left great toe Zone 2 mg 6-day taper.  Colchicine twice a day.  Return to clinic 1 to 2 weeks.  At that time will start allopurinol. 2. Type 2 diabetes mellitus without complication, without long-term current use of insulin ([place])   3. Arteriosclerosis of nonautologous coronary artery bypass graft   4. Pure hypercholesterolemia Take lovastatin every other day while on the colchicine.  Once off the colchicine will go back to  daily.    I have done the exam and reviewed the above chart and it is accurate to the best of my knowledge. Development worker, community has been used in this note in any air is in the dictation or transcription are unintentional.  [name], MD  [facility]

## 2018-07-03 ENCOUNTER — Other Ambulatory Visit: Payer: Self-pay | Admitting: Family Medicine

## 2018-07-03 DIAGNOSIS — E119 Type 2 diabetes mellitus without complications: Secondary | ICD-10-CM

## 2018-07-04 ENCOUNTER — Ambulatory Visit (INDEPENDENT_AMBULATORY_CARE_PROVIDER_SITE_OTHER): Payer: Medicare Other | Admitting: Family Medicine

## 2018-07-04 ENCOUNTER — Ambulatory Visit (INDEPENDENT_AMBULATORY_CARE_PROVIDER_SITE_OTHER): Payer: Medicare Other

## 2018-07-04 VITALS — BP 112/52 | HR 67 | Temp 97.9°F | Ht 67.0 in | Wt 207.0 lb

## 2018-07-04 VITALS — BP 112/52 | HR 67 | Temp 97.9°F | Ht 67.0 in | Wt 207.4 lb

## 2018-07-04 DIAGNOSIS — E7800 Pure hypercholesterolemia, unspecified: Secondary | ICD-10-CM

## 2018-07-04 DIAGNOSIS — E118 Type 2 diabetes mellitus with unspecified complications: Secondary | ICD-10-CM | POA: Diagnosis not present

## 2018-07-04 DIAGNOSIS — E119 Type 2 diabetes mellitus without complications: Secondary | ICD-10-CM | POA: Diagnosis not present

## 2018-07-04 DIAGNOSIS — I25810 Atherosclerosis of coronary artery bypass graft(s) without angina pectoris: Secondary | ICD-10-CM

## 2018-07-04 DIAGNOSIS — M109 Gout, unspecified: Secondary | ICD-10-CM

## 2018-07-04 DIAGNOSIS — I10 Essential (primary) hypertension: Secondary | ICD-10-CM

## 2018-07-04 DIAGNOSIS — Z0000 Encounter for general adult medical examination without abnormal findings: Secondary | ICD-10-CM

## 2018-07-04 MED ORDER — COLCHICINE 0.6 MG PO TABS: 0.6000 mg | ORAL_TABLET | Freq: Every day | ORAL | 5 refills | Status: DC

## 2018-07-04 NOTE — Patient Instructions (Addendum)
Todd Olson , Thank you for taking time to come for your Medicare Wellness Visit. I appreciate your ongoing commitment to your health goals. Please review the following plan we discussed and let me know if I can assist you in the future.   Screening recommendations/referrals: Colonoscopy: No longer required.  Recommended yearly ophthalmology/optometry visit for glaucoma screening and checkup Recommended yearly dental visit for hygiene and checkup  Vaccinations: Influenza vaccine: Up to date Pneumococcal vaccine: Completed series Tdap vaccine: Up to date, due 05/2026 Shingles vaccine: Pt declines today.     Advanced directives: Currently on file.   Conditions/risks identified: Continue to try an exercise 3 days a week for at least 30 minutes at a time.   Next appointment: 9:00 AM today with Dr Rosanna Randy  Preventive Care 47 Years and Older, Male Preventive care refers to lifestyle choices and visits with your health care provider that can promote health and wellness. What does preventive care include?  A yearly physical exam. This is also called an annual well check.  Dental exams once or twice a year.  Routine eye exams. Ask your health care provider how often you should have your eyes checked.  Personal lifestyle choices, including:  Daily care of your teeth and gums.  Regular physical activity.  Eating a healthy diet.  Avoiding tobacco and drug use.  Limiting alcohol use.  Practicing safe sex.  Taking low doses of aspirin every day.  Taking vitamin and mineral supplements as recommended by your health care provider. What happens during an annual well check? The services and screenings done by your health care provider during your annual well check will depend on your age, overall health, lifestyle risk factors, and family history of disease. Counseling  Your health care provider may ask you questions about your:  Alcohol use.  Tobacco use.  Drug use.  Emotional  well-being.  Home and relationship well-being.  Sexual activity.  Eating habits.  History of falls.  Memory and ability to understand (cognition).  Work and work Statistician. Screening  You may have the following tests or measurements:  Height, weight, and BMI.  Blood pressure.  Lipid and cholesterol levels. These may be checked every 5 years, or more frequently if you are over 11 years old.  Skin check.  Lung cancer screening. You may have this screening every year starting at age 49 if you have a 30-pack-year history of smoking and currently smoke or have quit within the past 15 years.  Fecal occult blood test (FOBT) of the stool. You may have this test every year starting at age 87.  Flexible sigmoidoscopy or colonoscopy. You may have a sigmoidoscopy every 5 years or a colonoscopy every 10 years starting at age 45.  Prostate cancer screening. Recommendations will vary depending on your family history and other risks.  Hepatitis C blood test.  Hepatitis B blood test.  Sexually transmitted disease (STD) testing.  Diabetes screening. This is done by checking your blood sugar (glucose) after you have not eaten for a while (fasting). You may have this done every 1-3 years.  Abdominal aortic aneurysm (AAA) screening. You may need this if you are a current or former smoker.  Osteoporosis. You may be screened starting at age 73 if you are at high risk. Talk with your health care provider about your test results, treatment options, and if necessary, the need for more tests. Vaccines  Your health care provider may recommend certain vaccines, such as:  Influenza vaccine. This is recommended  every year.  Tetanus, diphtheria, and acellular pertussis (Tdap, Td) vaccine. You may need a Td booster every 10 years.  Zoster vaccine. You may need this after age 60.  Pneumococcal 13-valent conjugate (PCV13) vaccine. One dose is recommended after age 88.  Pneumococcal  polysaccharide (PPSV23) vaccine. One dose is recommended after age 9. Talk to your health care provider about which screenings and vaccines you need and how often you need them. This information is not intended to replace advice given to you by your health care provider. Make sure you discuss any questions you have with your health care provider. Document Released: 05/15/2015 Document Revised: 01/06/2016 Document Reviewed: 02/17/2015 Elsevier Interactive Patient Education  2017 [place] Prevention in the Home Falls can cause injuries. They can happen to people of all ages. There are many things you can do to make your home safe and to help prevent falls. What can I do on the outside of my home?  Regularly fix the edges of walkways and driveways and fix any cracks.  Remove anything that might make you trip as you walk through a door, such as a raised step or threshold.  Trim any bushes or trees on the path to your home.  Use bright outdoor lighting.  Clear any walking paths of anything that might make someone trip, such as rocks or tools.  Regularly check to see if handrails are loose or broken. Make sure that both sides of any steps have handrails.  Any raised decks and porches should have guardrails on the edges.  Have any leaves, snow, or ice cleared regularly.  Use sand or salt on walking paths during winter.  Clean up any spills in your garage right away. This includes oil or grease spills. What can I do in the bathroom?  Use night lights.  Install grab bars by the toilet and in the tub and shower. Do not use towel bars as grab bars.  Use non-skid mats or decals in the tub or shower.  If you need to sit down in the shower, use a plastic, non-slip stool.  Keep the floor dry. Clean up any water that spills on the floor as soon as it happens.  Remove soap buildup in the tub or shower regularly.  Attach bath mats securely with double-sided non-slip rug  tape.  Do not have throw rugs and other things on the floor that can make you trip. What can I do in the bedroom?  Use night lights.  Make sure that you have a light by your bed that is easy to reach.  Do not use any sheets or blankets that are too big for your bed. They should not hang down onto the floor.  Have a firm chair that has side arms. You can use this for support while you get dressed.  Do not have throw rugs and other things on the floor that can make you trip. What can I do in the kitchen?  Clean up any spills right away.  Avoid walking on wet floors.  Keep items that you use a lot in easy-to-reach places.  If you need to reach something above you, use a strong step stool that has a grab bar.  Keep electrical cords out of the way.  Do not use floor polish or wax that makes floors slippery. If you must use wax, use non-skid floor wax.  Do not have throw rugs and other things on the floor that can make you trip. What  can I do with my stairs?  Do not leave any items on the stairs.  Make sure that there are handrails on both sides of the stairs and use them. Fix handrails that are broken or loose. Make sure that handrails are as long as the stairways.  Check any carpeting to make sure that it is firmly attached to the stairs. Fix any carpet that is loose or worn.  Avoid having throw rugs at the top or bottom of the stairs. If you do have throw rugs, attach them to the floor with carpet tape.  Make sure that you have a light switch at the top of the stairs and the bottom of the stairs. If you do not have them, ask someone to add them for you. What else can I do to help prevent falls?  Wear shoes that:  Do not have high heels.  Have rubber bottoms.  Are comfortable and fit you well.  Are closed at the toe. Do not wear sandals.  If you use a stepladder:  Make sure that it is fully opened. Do not climb a closed stepladder.  Make sure that both sides of the  stepladder are locked into place.  Ask someone to hold it for you, if possible.  Clearly mark and make sure that you can see:  Any grab bars or handrails.  First and last steps.  Where the edge of each step is.  Use tools that help you move around (mobility aids) if they are needed. These include:  Canes.  Walkers.  Scooters.  Crutches.  Turn on the lights when you go into a dark area. Replace any light bulbs as soon as they burn out.  Set up your furniture so you have a clear path. Avoid moving your furniture around.  If any of your floors are uneven, fix them.  If there are any pets around you, be aware of where they are.  Review your medicines with your doctor. Some medicines can make you feel dizzy. This can increase your chance of falling. Ask your doctor what other things that you can do to help prevent falls. This information is not intended to replace advice given to you by your health care provider. Make sure you discuss any questions you have with your health care provider. Document Released: 02/12/2009 Document Revised: 09/24/2015 Document Reviewed: 05/23/2014 Elsevier Interactive Patient Education  2017 [place].    Diabetes Mellitus and Nutrition, Adult When you have diabetes (diabetes mellitus), it is very important to have healthy eating habits because your blood sugar (glucose) levels are greatly affected by what you eat and drink. Eating healthy foods in the appropriate amounts, at about the same times every day, can help you:  Control your blood glucose.  Lower your risk of heart disease.  Improve your blood pressure.  Reach or maintain a healthy weight. Every person with diabetes is different, and each person has different needs for a meal plan. Your health care provider may recommend that you work with a diet and nutrition specialist (dietitian) to make a meal plan that is best for you. Your meal plan may vary depending on factors such  as:  The calories you need.  The medicines you take.  Your weight.  Your blood glucose, blood pressure, and cholesterol levels.  Your activity level.  Other health conditions you have, such as heart or kidney disease. How do carbohydrates affect me? Carbohydrates, also called carbs, affect your blood glucose level more than any other  type of food. Eating carbs naturally raises the amount of glucose in your blood. Carb counting is a method for keeping track of how many carbs you eat. Counting carbs is important to keep your blood glucose at a healthy level, especially if you use insulin or take certain oral diabetes medicines. It is important to know how many carbs you can safely have in each meal. This is different for every person. Your dietitian can help you calculate how many carbs you should have at each meal and for each snack. Foods that contain carbs include:  Bread, cereal, rice, pasta, and crackers.  Potatoes and corn.  Peas, beans, and lentils.  Milk and yogurt.  Fruit and juice.  Desserts, such as cakes, cookies, ice cream, and candy. How does alcohol affect me? Alcohol can cause a sudden decrease in blood glucose (hypoglycemia), especially if you use insulin or take certain oral diabetes medicines. Hypoglycemia can be a life-threatening condition. Symptoms of hypoglycemia (sleepiness, dizziness, and confusion) are similar to symptoms of having too much alcohol. If your health care provider says that alcohol is safe for you, follow these guidelines:  Limit alcohol intake to no more than 1 drink per day for nonpregnant women and 2 drinks per day for men. One drink equals 12 oz of beer, 5 oz of wine, or 1 oz of hard liquor.  Do not drink on an empty stomach.  Keep yourself hydrated with water, diet soda, or unsweetened iced tea.  Keep in mind that regular soda, juice, and other mixers may contain a lot of sugar and must be counted as carbs. What are tips for  following this plan?  Reading food labels  Start by checking the serving size on the "Nutrition Facts" label of packaged foods and drinks. The amount of calories, carbs, fats, and other nutrients listed on the label is based on one serving of the item. Many items contain more than one serving per package.  Check the total grams (g) of carbs in one serving. You can calculate the number of servings of carbs in one serving by dividing the total carbs by 15. For example, if a food has 30 g of total carbs, it would be equal to 2 servings of carbs.  Check the number of grams (g) of saturated and trans fats in one serving. Choose foods that have low or no amount of these fats.  Check the number of milligrams (mg) of salt (sodium) in one serving. Most people should limit total sodium intake to less than 2,300 mg per day.  Always check the nutrition information of foods labeled as "low-fat" or "nonfat". These foods may be higher in added sugar or refined carbs and should be avoided.  Talk to your dietitian to identify your daily goals for nutrients listed on the label. Shopping  Avoid buying canned, premade, or processed foods. These foods tend to be high in fat, sodium, and added sugar.  Shop around the outside edge of the grocery store. This includes fresh fruits and vegetables, bulk grains, fresh meats, and fresh dairy. Cooking  Use low-heat cooking methods, such as baking, instead of high-heat cooking methods like deep frying.  Cook using healthy oils, such as olive, canola, or sunflower oil.  Avoid cooking with butter, cream, or high-fat meats. Meal planning  Eat meals and snacks regularly, preferably at the same times every day. Avoid going long periods of time without eating.  Eat foods high in fiber, such as fresh fruits, vegetables, beans, and  whole grains. Talk to your dietitian about how many servings of carbs you can eat at each meal.  Eat 4-6 ounces (oz) of lean protein each day,  such as lean meat, chicken, fish, eggs, or tofu. One oz of lean protein is equal to: ? 1 oz of meat, chicken, or fish. ? 1 egg. ?  cup of tofu.  Eat some foods each day that contain healthy fats, such as avocado, nuts, seeds, and fish. Lifestyle  Check your blood glucose regularly.  Exercise regularly as told by your health care provider. This may include: ? 150 minutes of moderate-intensity or vigorous-intensity exercise each week. This could be brisk walking, biking, or water aerobics. ? Stretching and doing strength exercises, such as yoga or weightlifting, at least 2 times a week.  Take medicines as told by your health care provider.  Do not use any products that contain nicotine or tobacco, such as cigarettes and e-cigarettes. If you need help quitting, ask your health care provider.  Work with a Social worker or diabetes educator to identify strategies to manage stress and any emotional and social challenges. Questions to ask a health care provider  Do I need to meet with a diabetes educator?  Do I need to meet with a dietitian?  What number can I call if I have questions?  When are the best times to check my blood glucose? Where to find more information:  American Diabetes Association: diabetes.org  Academy of Nutrition and Dietetics: www.eatright.[employer] of Diabetes and Digestive and Kidney Diseases (NIH): [employer] Summary  A healthy meal plan will help you control your blood glucose and maintain a healthy lifestyle.  Working with a diet and nutrition specialist (dietitian) can help you make a meal plan that is best for you.  Keep in mind that carbohydrates (carbs) and alcohol have immediate effects on your blood glucose levels. It is important to count carbs and to use alcohol carefully. This information is not intended to replace advice given to you by your health care provider. Make sure you discuss any questions you have with your health  care provider. Document Released: 01/13/2005 Document Revised: 11/16/2016 Document Reviewed: 05/23/2016 Elsevier Interactive Patient Education  2019 [employer].

## 2018-07-04 NOTE — Patient Instructions (Signed)
Stop Iron.  Start Actos 1 tablet daily.

## 2018-07-04 NOTE — Progress Notes (Signed)
Subjective:   Todd Olson is a 78 y.o. male who presents for Medicare Annual/Subsequent preventive examination.  Review of Systems:  N/A  Cardiac Risk Factors include: advanced age (>59men, >27 women);diabetes mellitus;dyslipidemia;hypertension;male gender;obesity (BMI >30kg/m2)     Objective:    Vitals: BP (!) 112/52 (BP Location: Left Arm)   Pulse 67   Temp 97.9 F (36.6 C) (Oral)   Ht 5\' 7"  (1.702 m)   Wt 207 lb 6.4 oz (94.1 kg)   BMI 32.48 kg/m   Body mass index is 32.48 kg/m.  Advanced Directives 07/04/2018 08/11/2017 06/07/2017 12/27/2016 12/06/2016 05/31/2016 08/03/2015  Does Patient Have a Medical Advance Directive? Yes Yes Yes Yes Yes Yes No  Type of Paramedic of [place];Living will [place];Living will [place];Living will [place];Living will [place];Living will - -  Does patient want to make changes to medical advance directive? - No - Patient declined - No - Patient declined No - Patient declined - -  Copy of [place] in Chart? Yes - validated most recent copy scanned in chart (See row information) Yes Yes Yes Yes - -    Tobacco Social History   Tobacco Use  Smoking Status Former Smoker  . Last attempt to quit: 11/17/1977  . Years since quitting: 40.6  Smokeless Tobacco Never Used     Counseling given: Not Answered   Clinical Intake:  Pre-visit preparation completed: Yes  Pain : No/denies pain Pain Score: 0-No pain    Diabetes:  Is the patient diabetic?  Yes type 2   If diabetic, was a CBG obtained today?  No  Did the patient bring in their glucometer from home?  No  How often do you monitor your CBG's? Once a day.   Financial Strains and Diabetes Management:  Are you having any financial strains with the device, your supplies or your medication? No .  Does the patient want to be seen by Chronic Care Management for management of  their diabetes?  No  Would the patient like to be referred to a Nutritionist or for Diabetic Management?  No   Diabetic Exams:  Diabetic Eye Exam: Completed 07/04/17. Overdue for diabetic eye exam. Pt has been advised about the importance in completing this exam. Apt is made for 07/13/18 with Dr George Ina.   Diabetic Foot Exam: Completed 05/31/16. Pt has been advised about the importance in completing this exam. Note made to f/u on this at today's apt.    Nutritional Status: BMI > 30  Obese Nutritional Risks: None   How often do you need to have someone help you when you read instructions, pamphlets, or other written materials from your doctor or pharmacy?: 1 - Never  Interpreter Needed?: No  Information entered by :: [facility], LPN  Past Medical History:  Diagnosis Date  . Anemia   . CHF (congestive heart failure) ([place])   . Coronary artery disease   . Diabetes mellitus without complication ([place])   . GI bleeding    upper and lower  . History of hiatal hernia   . Hypertension   . Myocardial infarction ([place])    X 4 LAST 2003   Past Surgical History:  Procedure Laterality Date  . BACK SURGERY  2016   laminectomy lumbar  . CATARACT EXTRACTION W/PHACO Left 12/06/2016   Procedure: CATARACT EXTRACTION PHACO AND INTRAOCULAR LENS PLACEMENT (IOC);  Surgeon: Birder Robson, MD;  Location: [facility] ORS;  Service: Ophthalmology;  Laterality: Left;  [place] 00:31 AP% 15.6 CDE 4.99 Fluid pack lot # [id number]  . CATARACT EXTRACTION W/PHACO Right 12/27/2016   Procedure: CATARACT EXTRACTION PHACO AND INTRAOCULAR LENS PLACEMENT (IOC);  Surgeon: Birder Robson, MD;  Location: [facility] ORS;  Service: Ophthalmology;  Laterality: Right;  [place] 00:46 AP% 12.5 CDE 5.79 Fluid pack lot # [id number]  . CHOLECYSTECTOMY  2012   Dr. Rochel Brome  . colonosopy    . CORONARY ANGIOPLASTY     STENT PLACEMENT  . EYE SURGERY     bilateral  . FOOT SURGERY    . HEMORROIDECTOMY    . RECTAL SURGERY     repair of bleed  after hemrrhoid surgery  . SHOULDER ARTHROSCOPY WITH OPEN ROTATOR CUFF REPAIR AND DISTAL CLAVICLE ACROMINECTOMY Right 08/15/2017   Procedure: right shoulder arthroscopy, arthroscopic subacromial decompression, distal clavicle excision, biceps tenotomy, open rotator cuff repair;  Surgeon: Thornton Park, MD;  Location: [facility] ORS;  Service: Orthopedics;  Laterality: Right;  . SHOULDER SURGERY     Family History  Problem Relation Age of Onset  . Heart disease Mother   . Heart disease Father   . Hypertension Father   . Bipolar disorder Sister        with hospitalizations  . Diabetes Brother   . Hypertension Brother   . Heart disease Brother   . Diabetes Brother   . Heart disease Brother        has stents   Social History   Socioeconomic History  . Marital status: Married    Spouse name: Not on file  . Number of children: 3  . Years of education: Not on file  . Highest education level: Associate degree: occupational, Hotel manager, or vocational program  Occupational History  . Occupation: retired  Scientific laboratory technician  . Financial resource strain: Not hard at all  . Food insecurity:    Worry: Never true    Inability: Never true  . Transportation needs:    Medical: No    Non-medical: No  Tobacco Use  . Smoking status: Former Smoker    Last attempt to quit: 11/17/1977    Years since quitting: 40.6  . Smokeless tobacco: Never Used  Substance and Sexual Activity  . Alcohol use: No    Alcohol/week: 0.0 standard drinks  . Drug use: No  . Sexual activity: Not Currently  Lifestyle  . Physical activity:    Days per week: 0 days    Minutes per session: 0 min  . Stress: Not at all  Relationships  . Social connections:    Talks on phone: Patient refused    Gets together: Patient refused    Attends religious service: Patient refused    Active member of club or organization: Patient refused    Attends meetings of clubs or organizations: Patient refused    Relationship status: Patient  refused  Other Topics Concern  . Not on file  Social History Narrative  . Not on file    Outpatient Encounter Medications as of 07/04/2018  Medication Sig  . allopurinol (ZYLOPRIM) 100 MG tablet Take 1 tablet (100 mg total) by mouth daily. Start on Feb 20,2020  . amLODipine (NORVASC) 5 MG tablet TAKE 1 TABLET BY MOUTH ONCE DAILY  . aspirin 81 MG tablet Take 81 mg by mouth every other day. At night  . Cholecalciferol 1000 UNITS capsule Take 1,000 Units by mouth daily.   . clopidogrel (PLAVIX) 75 MG tablet TAKE 1 TABLET BY MOUTH ONCE DAILY  .  colchicine 0.6 MG tablet Take 1 tablet (0.6 mg total) by mouth 2 (two) times daily.  . ferrous sulfate 325 (65 FE) MG tablet Take 325 mg by mouth daily.  . furosemide (LASIX) 40 MG tablet TAKE 1 TABLET BY MOUTH ONCE DAILY  . Glucosamine-Chondroit-Vit C-Mn (GLUCOSAMINE CHONDR 1500 COMPLX) CAPS Take 1 capsule by mouth 2 (two) times daily.   Marland Kitchen glucose blood (ONE TOUCH ULTRA TEST) test strip USE TO CHECK BLOOD SUGAR TWICE DAILY  . lisinopril-hydrochlorothiazide (PRINZIDE,ZESTORETIC) 20-12.5 MG tablet TAKE 1 TABLET BY MOUTH ONCE DAILY  . lovastatin (MEVACOR) 40 MG tablet TAKE 1 TABLET BY MOUTH AT BEDTIME (Patient taking differently: Take 40 mg by mouth every other day. )  . magnesium oxide (MAG-OX) 400 (241.3 MG) MG tablet Take 400 mg by mouth 2 (two) times daily.   . metFORMIN (GLUCOPHAGE) 1000 MG tablet TAKE 1 TABLET BY MOUTH TWICE DAILY WITH A MEAL  . metoprolol tartrate (LOPRESSOR) 25 MG tablet TAKE 1 TABLET BY MOUTH TWICE DAILY  . Omega-3 Fatty Acids (FISH OIL) 1000 MG CAPS Take 525 mg by mouth daily.   . pioglitazone (ACTOS) 30 MG tablet TAKE 1 & 1/2 (ONE & ONE-HALF) TABLETS BY MOUTH ONCE DAILY (Patient taking differently: Take 30 mg by mouth daily. )  . ondansetron (ZOFRAN) 4 MG tablet Take 1 tablet (4 mg total) by mouth every 8 (eight) hours as needed for nausea or vomiting. (Patient not taking: Reported on 07/04/2018)  . predniSONE (DELTASONE) 10 MG  tablet Take 6 pills on day 1 the decrease by 1 pill each day until gone (Patient not taking: Reported on 06/13/2018)  . predniSONE (STERAPRED UNI-PAK 21 TAB) 10 MG (21) TBPK tablet 6-5-4-3-2-1 (Patient not taking: Reported on 07/04/2018)   No facility-administered encounter medications on file as of 07/04/2018.     Activities of Daily Living In your present state of health, do you have any difficulty performing the following activities: 07/04/2018 08/11/2017  Hearing? N N  Vision? Y N  Comment Due to Glaucoma and MD. Pt currently has floaters. Eye exam scheduled for 07/13/18 with Dr George Ina. -  Difficulty concentrating or making decisions? N N  Walking or climbing stairs? N N  Dressing or bathing? N N  Doing errands, shopping? N N  Preparing Food and eating ? N -  Using the Toilet? N -  In the past six months, have you accidently leaked urine? N -  Do you have problems with loss of bowel control? N -  Managing your Medications? N -  Managing your Finances? N -  Housekeeping or managing your Housekeeping? N -  Some recent data might be hidden    Patient Care Team: Jerrol Banana., MD as PCP - General (Family Medicine) Isaias Cowman, MD as Consulting Physician (Cardiology) Birder Robson, MD as Referring Physician (Ophthalmology)   Assessment:   This is a routine wellness examination for [place].  Exercise Activities and Dietary recommendations Current Exercise Habits: The patient does not participate in regular exercise at present, Exercise limited by: orthopedic condition(s)  Goals    . Exercise 3x per week (30 min per time)     Recommend to start exercising for 3 days a week for at least 30 minutes.        Fall Risk Fall Risk  07/04/2018 06/07/2017 05/31/2016 05/28/2015  Falls in the past year? 0 No No No   FALL RISK PREVENTION PERTAINING TO THE HOME: Any stairs in or around the home? Yes  If  so, do they handrails? Yes   Home free of loose throw rugs in walkways,  pet beds, electrical cords, etc? Yes  Adequate lighting in your home to reduce risk of falls? Yes   ASSISTIVE DEVICES UTILIZED TO PREVENT FALLS:  Life alert? No  Use of a cane, walker or w/c? Yes, as needed use. Grab bars in the bathroom? Yes  Shower chair or bench in shower? No  Elevated toilet seat or a handicapped toilet? No    TIMED UP AND GO:  Was the test performed? No .    Depression Screen PHQ 2/9 Scores 07/04/2018 07/04/2018 06/07/2017 06/07/2017  PHQ - 2 Score 1 1 0 0  PHQ- 9 Score 1 - 0 -    Cognitive Function     6CIT Screen 07/04/2018 06/07/2017 05/31/2016  What Year? 0 points 0 points 0 points  What month? 0 points 0 points 0 points  What time? 0 points 0 points 0 points  Count back from 20 0 points 0 points 0 points  Months in reverse 0 points 0 points 0 points  Repeat phrase 0 points 0 points 0 points  Total Score 0 0 0    Immunization History  Administered Date(s) Administered  . Influenza, High Dose Seasonal PF 01/26/2015, 02/01/2016, 02/06/2017, 01/03/2018  . Pneumococcal Conjugate-13 11/14/2013  . Pneumococcal Polysaccharide-23 10/17/2011, 07/08/2014  . Td 05/31/2016    Qualifies for Shingles Vaccine? Yes . Due for Shingrix. Education has been provided regarding the importance of this vaccine. Pt has been advised to call insurance company to determine out of pocket expense. Advised may also receive vaccine at local pharmacy or Health Dept. Verbalized acceptance and understanding.  Tdap: Up to date  Flu Vaccine: Up to date  Pneumococcal Vaccine: Up to date  Screening Tests Health Maintenance  Topic Date Due  . FOOT EXAM  05/31/2017  . OPHTHALMOLOGY EXAM  07/05/2018  . HEMOGLOBIN A1C  08/16/2018  . TETANUS/TDAP  05/31/2026  . INFLUENZA VACCINE  Completed  . PNA vac Low Risk Adult  Completed   Cancer Screenings:  Colorectal Screening: No longer required.   Lung Cancer Screening: (Low Dose CT Chest recommended if Age 8-80 years, 30 pack-year  currently smoking OR have quit w/in 15years.) does not qualify.    Additional Screening:  Vision Screening: Recommended annual ophthalmology exams for early detection of glaucoma and other disorders of the eye.  Dental Screening: Recommended annual dental exams for proper oral hygiene  Community Resource Referral:  CRR required this visit?  No        Plan:  I have personally reviewed and addressed the Medicare Annual Wellness questionnaire and have noted the following in the patient's chart:  A. Medical and social history B. Use of alcohol, tobacco or illicit drugs  C. Current medications and supplements D. Functional ability and status E.  Nutritional status F.  Physical activity G. Advance directives H. List of other physicians I.  Hospitalizations, surgeries, and ER visits in previous 12 months J.  [place] such as hearing and vision if needed, cognitive and depression L. Referrals and appointments - none  In addition, I have reviewed and discussed with patient certain preventive protocols, quality metrics, and best practice recommendations. A written personalized care plan for preventive services as well as general preventive health recommendations were provided to patient.  See attached scanned questionnaire for additional information.   Signed,  Fabio Neighbors, LPN Nurse Health Advisor   Nurse Recommendations: Pt needs a diabetic foot  exam at today's OV.

## 2018-07-04 NOTE — Progress Notes (Signed)
Patient: Todd Olson, Male    DOB: Feb 14, 1941, 78 y.o.   MRN: [id number] Visit Date: 07/04/2018  Today's Provider: [name], MD   Chief Complaint  Patient presents with  . Annual Exam   Subjective:  Todd Olson is a 78 y.o. male who presents today for health maintenance and complete physical. He feels well. He reports he is not exercising . He reports he is sleeping well. Gout Is better.  Taking iron daily.  He is taking Actos at 1-1/2 daily.   03/19/2013 Colonoscopy, Dr Ely-hemorrhoid  Review of Systems  Constitutional: Negative.   HENT: Negative.   Eyes: Negative.   Respiratory: Negative.   Cardiovascular: Negative.   Gastrointestinal: Negative.   Endocrine: Negative.   Genitourinary: Negative.   Musculoskeletal: Negative.   Skin: Negative.   Allergic/Immunologic: Negative.   Neurological: Negative.   Hematological: Negative.   Psychiatric/Behavioral: Negative.     Social History   Socioeconomic History  . Marital status: Married    Spouse name: Not on file  . Number of children: 3  . Years of education: Not on file  . Highest education level: Associate degree: occupational, Hotel manager, or vocational program  Occupational History  . Occupation: retired  Scientific laboratory technician  . Financial resource strain: Not hard at all  . Food insecurity:    Worry: Never true    Inability: Never true  . Transportation needs:    Medical: No    Non-medical: No  Tobacco Use  . Smoking status: Former Smoker    Last attempt to quit: 11/17/1977    Years since quitting: 40.6  . Smokeless tobacco: Never Used  Substance and Sexual Activity  . Alcohol use: No    Alcohol/week: 0.0 standard drinks  . Drug use: No  . Sexual activity: Not Currently  Lifestyle  . Physical activity:    Days per week: 0 days    Minutes per session: 0 min  . Stress: Not at all  Relationships  . Social connections:    Talks on phone: Patient refused    Gets together: Patient refused    Attends  religious service: Patient refused    Active member of club or organization: Patient refused    Attends meetings of clubs or organizations: Patient refused    Relationship status: Patient refused  . Intimate partner violence:    Fear of current or ex partner: No    Emotionally abused: No    Physically abused: No    Forced sexual activity: No  Other Topics Concern  . Not on file  Social History Narrative  . Not on file    Patient Active Problem List   Diagnosis Date Noted  . Arteriosclerosis of nonautologous coronary artery bypass graft 09/10/2014  . Back pain, chronic 09/10/2014  . Diabetes mellitus, type 2 ([place]) 09/10/2014  . Well controlled diabetes mellitus ([place]) 09/10/2014  . Hemorrhoid 09/10/2014  . HLD (hyperlipidemia) 09/10/2014  . BP (high blood pressure) 09/10/2014  . Fatty tumor 09/10/2014  . Absolute anemia 09/10/2014  . Muscle ache 09/10/2014  . Arthritis of hand, degenerative 09/10/2014  . Benign neoplasm of colon 09/10/2014  . Rotator cuff syndrome 09/10/2014  . Head revolving around 09/10/2014  . [place]ia 09/10/2014    Past Surgical History:  Procedure Laterality Date  . BACK SURGERY  2016   laminectomy lumbar  . CATARACT EXTRACTION W/PHACO Left 12/06/2016   Procedure: CATARACT EXTRACTION PHACO AND INTRAOCULAR LENS PLACEMENT (IOC);  Surgeon: Birder Robson, MD;  Location:  [facility] ORS;  Service: Ophthalmology;  Laterality: Left;  [place] 00:31 AP% 15.6 CDE 4.99 Fluid pack lot # [id number]  . CATARACT EXTRACTION W/PHACO Right 12/27/2016   Procedure: CATARACT EXTRACTION PHACO AND INTRAOCULAR LENS PLACEMENT (IOC);  Surgeon: Birder Robson, MD;  Location: [facility] ORS;  Service: Ophthalmology;  Laterality: Right;  [place] 00:46 AP% 12.5 CDE 5.79 Fluid pack lot # [id number]  . CHOLECYSTECTOMY  2012   Dr. Rochel Brome  . colonosopy    . CORONARY ANGIOPLASTY     STENT PLACEMENT  . EYE SURGERY     bilateral  . FOOT SURGERY    . HEMORROIDECTOMY    . RECTAL SURGERY      repair of bleed after hemrrhoid surgery  . SHOULDER ARTHROSCOPY WITH OPEN ROTATOR CUFF REPAIR AND DISTAL CLAVICLE ACROMINECTOMY Right 08/15/2017   Procedure: right shoulder arthroscopy, arthroscopic subacromial decompression, distal clavicle excision, biceps tenotomy, open rotator cuff repair;  Surgeon: Thornton Park, MD;  Location: [facility] ORS;  Service: Orthopedics;  Laterality: Right;  . SHOULDER SURGERY      His family history includes Bipolar disorder in his sister; Diabetes in his brother and brother; Heart disease in his brother, brother, father, and mother; Hypertension in his brother and father.     Outpatient Encounter Medications as of 07/04/2018  Medication Sig  . allopurinol (ZYLOPRIM) 100 MG tablet Take 1 tablet (100 mg total) by mouth daily. Start on Feb 20,2020  . amLODipine (NORVASC) 5 MG tablet TAKE 1 TABLET BY MOUTH ONCE DAILY  . aspirin 81 MG tablet Take 81 mg by mouth every other day. At night  . Cholecalciferol 1000 UNITS capsule Take 1,000 Units by mouth daily.   . clopidogrel (PLAVIX) 75 MG tablet TAKE 1 TABLET BY MOUTH ONCE DAILY  . colchicine 0.6 MG tablet Take 1 tablet (0.6 mg total) by mouth 2 (two) times daily.  . ferrous sulfate 325 (65 FE) MG tablet Take 325 mg by mouth daily.  . furosemide (LASIX) 40 MG tablet TAKE 1 TABLET BY MOUTH ONCE DAILY  . Glucosamine-Chondroit-Vit C-Mn (GLUCOSAMINE CHONDR 1500 COMPLX) CAPS Take 1 capsule by mouth 2 (two) times daily.   Marland Kitchen glucose blood (ONE TOUCH ULTRA TEST) test strip USE TO CHECK BLOOD SUGAR TWICE DAILY  . lisinopril-hydrochlorothiazide (PRINZIDE,ZESTORETIC) 20-12.5 MG tablet TAKE 1 TABLET BY MOUTH ONCE DAILY  . lovastatin (MEVACOR) 40 MG tablet TAKE 1 TABLET BY MOUTH AT BEDTIME (Patient taking differently: Take 40 mg by mouth every other day. )  . magnesium oxide (MAG-OX) 400 (241.3 MG) MG tablet Take 400 mg by mouth 2 (two) times daily.   . metFORMIN (GLUCOPHAGE) 1000 MG tablet TAKE 1 TABLET BY MOUTH TWICE DAILY WITH A  MEAL  . metoprolol tartrate (LOPRESSOR) 25 MG tablet TAKE 1 TABLET BY MOUTH TWICE DAILY  . Omega-3 Fatty Acids (FISH OIL) 1000 MG CAPS Take 525 mg by mouth daily.   . ondansetron (ZOFRAN) 4 MG tablet Take 1 tablet (4 mg total) by mouth every 8 (eight) hours as needed for nausea or vomiting. (Patient not taking: Reported on 07/04/2018)  . pioglitazone (ACTOS) 30 MG tablet TAKE 1 & 1/2 (ONE & ONE-HALF) TABLETS BY MOUTH ONCE DAILY (Patient taking differently: Take 30 mg by mouth daily. )  . [DISCONTINUED] predniSONE (DELTASONE) 10 MG tablet Take 6 pills on day 1 the decrease by 1 pill each day until gone (Patient not taking: Reported on 06/13/2018)  . [DISCONTINUED] predniSONE (STERAPRED UNI-PAK 21 TAB) 10 MG (21) TBPK tablet 6-5-4-3-2-1 (Patient not  taking: Reported on 07/04/2018)   No facility-administered encounter medications on file as of 07/04/2018.     Patient Care Team: Jerrol Banana., MD as PCP - General (Family Medicine) Isaias Cowman, MD as Consulting Physician (Cardiology) Birder Robson, MD as Referring Physician (Ophthalmology)      Objective:   Vitals:  Vitals:   07/04/18 0910  BP: (!) 112/52  Pulse: 67  Temp: 97.9 F (36.6 C)  TempSrc: Oral  Weight: 207 lb (93.9 kg)  Height: 5\' 7"  (1.702 m)    Physical Exam Constitutional:      Appearance: Normal appearance. He is normal weight.  HENT:     Head: Normocephalic and atraumatic.     Right Ear: Tympanic membrane, ear canal and external ear normal.     Left Ear: Tympanic membrane, ear canal and external ear normal.     Nose: Nose normal.     Mouth/Throat:     Mouth: Mucous membranes are moist.     Pharynx: Oropharynx is clear.  Eyes:     Extraocular Movements: Extraocular movements intact.     Conjunctiva/sclera: Conjunctivae normal.     Pupils: Pupils are equal, round, and reactive to light.  Neck:     Musculoskeletal: Normal range of motion and neck supple.  Cardiovascular:     Rate and Rhythm:  Normal rate and regular rhythm.     Pulses: Normal pulses.     Heart sounds: Normal heart sounds.  Pulmonary:     Effort: Pulmonary effort is normal.     Breath sounds: Normal breath sounds.  Abdominal:     General: Abdomen is flat. Bowel sounds are normal.     Palpations: Abdomen is soft.  Genitourinary:    Penis: Normal.      Scrotum/Testes: Normal.     Prostate: Normal.     Rectum: Normal.  Musculoskeletal: Normal range of motion.     Comments: Trace pedal edema  Skin:    General: Skin is warm and dry.     Comments: AKs on top of head.  Neurological:     General: No focal deficit present.     Mental Status: He is alert and oriented to person, place, and time. Mental status is at baseline.  Psychiatric:        Mood and Affect: Mood normal.        Behavior: Behavior normal.        Thought Content: Thought content normal.        Judgment: Judgment normal.      Depression Screen PHQ 2/9 Scores 07/04/2018 07/04/2018 06/07/2017 06/07/2017  PHQ - 2 Score 1 1 0 0  PHQ- 9 Score 1 - 0 -      Assessment & Plan:     Routine Health Maintenance and Physical Exam  Exercise Activities and Dietary recommendations Goals    . Exercise 3x per week (30 min per time)     Recommend to start exercising for 3 days a week for at least 30 minutes.        Immunization History  Administered Date(s) Administered  . Influenza, High Dose Seasonal PF 01/26/2015, 02/01/2016, 02/06/2017, 01/03/2018  . Pneumococcal Conjugate-13 11/14/2013  . Pneumococcal Polysaccharide-23 10/17/2011, 07/08/2014  . Td 05/31/2016    Health Maintenance  Topic Date Due  . FOOT EXAM  05/31/2017  . OPHTHALMOLOGY EXAM  07/05/2018  . HEMOGLOBIN A1C  08/16/2018  . TETANUS/TDAP  05/31/2026  . INFLUENZA VACCINE  Completed  . PNA vac Low  Risk Adult  Completed     Discussed health benefits of physical activity, and encouraged him to engage in regular exercise appropriate for his age and condition.  1. Encounter for  annual physical exam   2. Encounter for Medicare annual wellness exam Done 07/04/18.  3. Type 2 diabetes mellitus with complication, without long-term current use of insulin (HCC) With CAD.  4. Pure hypercholesterolemia  - CBC with Differential/Platelet - Lipid panel - TSH  5. Essential hypertension  - Comprehensive metabolic panel  6. Gout, unspecified cause, unspecified chronicity, unspecified site  - colchicine 0.6 MG tablet; Take 1 tablet (0.6 mg total) by mouth daily.  Dispense: 30 tablet; Refill: 5 7.CAD All risk factors treated.   I have done the exam and reviewed the chart and it is accurate to the best of my knowledge. Development worker, community has been used and  any errors in dictation or transcription are unintentional. Miguel Aschoff M.D. [facility]

## 2018-07-05 LAB — CBC WITH DIFFERENTIAL/PLATELET
Basophils Absolute: 0 10*3/uL (ref 0.0–0.2)
Basos: 1 %
EOS (ABSOLUTE): 0.3 10*3/uL (ref 0.0–0.4)
Eos: 6 %
Hematocrit: 39.1 % (ref 37.5–51.0)
Hemoglobin: 13.2 g/dL (ref 13.0–17.7)
Immature Grans (Abs): 0 10*3/uL (ref 0.0–0.1)
Immature Granulocytes: 0 %
Lymphocytes Absolute: 1 10*3/uL (ref 0.7–3.1)
Lymphs: 19 %
MCH: 30.5 pg (ref 26.6–33.0)
MCHC: 33.8 g/dL (ref 31.5–35.7)
MCV: 90 fL (ref 79–97)
Monocytes Absolute: 0.4 10*3/uL (ref 0.1–0.9)
Monocytes: 7 %
Neutrophils Absolute: 3.6 10*3/uL (ref 1.4–7.0)
Neutrophils: 67 %
Platelets: 221 10*3/uL (ref 150–450)
RBC: 4.33 x10E6/uL (ref 4.14–5.80)
RDW: 13.6 % (ref 11.6–15.4)
WBC: 5.3 10*3/uL (ref 3.4–10.8)

## 2018-07-05 LAB — COMPREHENSIVE METABOLIC PANEL
ALBUMIN: 4.7 g/dL (ref 3.7–4.7)
ALT: 24 IU/L (ref 0–44)
AST: 21 IU/L (ref 0–40)
Albumin/Globulin Ratio: 2.1 (ref 1.2–2.2)
Alkaline Phosphatase: 76 IU/L (ref 39–117)
BUN/Creatinine Ratio: 17 (ref 10–24)
BUN: 16 mg/dL (ref 8–27)
Bilirubin Total: 0.6 mg/dL (ref 0.0–1.2)
CO2: 24 mmol/L (ref 20–29)
CREATININE: 0.95 mg/dL (ref 0.76–1.27)
Calcium: 9.7 mg/dL (ref 8.6–10.2)
Chloride: 98 mmol/L (ref 96–106)
GFR calc Af Amer: 90 mL/min/{1.73_m2} (ref >59)
GFR calc non Af Amer: 77 mL/min/{1.73_m2} (ref >59)
GLOBULIN, TOTAL: 2.2 g/dL (ref 1.5–4.5)
GLUCOSE: 133 mg/dL — AB (ref 65–99)
Potassium: 4 mmol/L (ref 3.5–5.2)
SODIUM: 137 mmol/L (ref 134–144)
Total Protein: 6.9 g/dL (ref 6.0–8.5)

## 2018-07-05 LAB — LIPID PANEL
Chol/HDL Ratio: 4 ratio (ref 0.0–5.0)
Cholesterol, Total: 205 mg/dL — ABNORMAL HIGH (ref 100–199)
HDL: 51 mg/dL (ref >39)
LDL CALC: 114 mg/dL — AB (ref 0–99)
Triglycerides: 198 mg/dL — ABNORMAL HIGH (ref 0–149)
VLDL Cholesterol Cal: 40 mg/dL (ref 5–40)

## 2018-07-05 LAB — TSH: TSH: 0.685 u[IU]/mL (ref 0.450–4.500)

## 2018-07-10 ENCOUNTER — Other Ambulatory Visit: Payer: Self-pay | Admitting: Family Medicine

## 2018-07-10 DIAGNOSIS — E7800 Pure hypercholesterolemia, unspecified: Secondary | ICD-10-CM

## 2018-07-10 MED ORDER — ROSUVASTATIN CALCIUM 10 MG PO TABS: 10.0000 mg | ORAL_TABLET | Freq: Every day | ORAL | 3 refills | Status: DC

## 2018-07-13 DIAGNOSIS — E119 Type 2 diabetes mellitus without complications: Secondary | ICD-10-CM | POA: Diagnosis not present

## 2018-07-13 DIAGNOSIS — E118 Type 2 diabetes mellitus with unspecified complications: Secondary | ICD-10-CM | POA: Insufficient documentation

## 2018-07-13 LAB — HM DIABETES EYE EXAM

## 2018-07-23 ENCOUNTER — Other Ambulatory Visit: Payer: Self-pay

## 2018-07-23 MED ORDER — AMLODIPINE BESYLATE 5 MG PO TABS: 5.0000 mg | ORAL_TABLET | Freq: Every day | ORAL | 12 refills | Status: DC

## 2018-07-23 NOTE — Telephone Encounter (Signed)
Patient is requesting refill on his Amlodipine 5 MG.Please advise

## 2018-07-31 ENCOUNTER — Other Ambulatory Visit: Payer: Self-pay | Admitting: Family Medicine

## 2018-09-17 ENCOUNTER — Other Ambulatory Visit: Payer: Self-pay | Admitting: Family Medicine

## 2018-09-18 NOTE — Telephone Encounter (Signed)
Please review

## 2018-09-20 DIAGNOSIS — I2119 ST elevation (STEMI) myocardial infarction involving other coronary artery of inferior wall: Secondary | ICD-10-CM | POA: Diagnosis not present

## 2018-09-20 DIAGNOSIS — I493 Ventricular premature depolarization: Secondary | ICD-10-CM | POA: Diagnosis not present

## 2018-09-20 DIAGNOSIS — I10 Essential (primary) hypertension: Secondary | ICD-10-CM | POA: Diagnosis not present

## 2018-09-20 DIAGNOSIS — E119 Type 2 diabetes mellitus without complications: Secondary | ICD-10-CM | POA: Diagnosis not present

## 2018-09-20 DIAGNOSIS — I2510 Atherosclerotic heart disease of native coronary artery without angina pectoris: Secondary | ICD-10-CM | POA: Diagnosis not present

## 2018-10-02 ENCOUNTER — Other Ambulatory Visit: Payer: Self-pay | Admitting: Family Medicine

## 2018-10-03 ENCOUNTER — Other Ambulatory Visit: Payer: Self-pay | Admitting: Family Medicine

## 2018-10-03 DIAGNOSIS — E119 Type 2 diabetes mellitus without complications: Secondary | ICD-10-CM

## 2018-10-04 NOTE — Telephone Encounter (Signed)
Please review

## 2018-10-05 ENCOUNTER — Other Ambulatory Visit: Payer: Self-pay

## 2018-10-05 DIAGNOSIS — E119 Type 2 diabetes mellitus without complications: Secondary | ICD-10-CM

## 2018-10-05 DIAGNOSIS — I2510 Atherosclerotic heart disease of native coronary artery without angina pectoris: Secondary | ICD-10-CM | POA: Diagnosis not present

## 2018-10-05 DIAGNOSIS — I2119 ST elevation (STEMI) myocardial infarction involving other coronary artery of inferior wall: Secondary | ICD-10-CM | POA: Diagnosis not present

## 2018-10-05 DIAGNOSIS — I25810 Atherosclerosis of coronary artery bypass graft(s) without angina pectoris: Secondary | ICD-10-CM | POA: Diagnosis not present

## 2018-10-05 MED ORDER — PIOGLITAZONE HCL 45 MG PO TABS: 45.0000 mg | ORAL_TABLET | Freq: Every day | ORAL | 0 refills | Status: DC

## 2018-10-10 DIAGNOSIS — I493 Ventricular premature depolarization: Secondary | ICD-10-CM | POA: Diagnosis not present

## 2018-10-10 DIAGNOSIS — I2119 ST elevation (STEMI) myocardial infarction involving other coronary artery of inferior wall: Secondary | ICD-10-CM | POA: Diagnosis not present

## 2018-10-10 DIAGNOSIS — I10 Essential (primary) hypertension: Secondary | ICD-10-CM | POA: Diagnosis not present

## 2018-10-10 DIAGNOSIS — E119 Type 2 diabetes mellitus without complications: Secondary | ICD-10-CM | POA: Diagnosis not present

## 2018-10-10 DIAGNOSIS — I2510 Atherosclerotic heart disease of native coronary artery without angina pectoris: Secondary | ICD-10-CM | POA: Diagnosis not present

## 2018-10-22 ENCOUNTER — Other Ambulatory Visit: Payer: Self-pay | Admitting: Family Medicine

## 2018-11-05 ENCOUNTER — Telehealth: Payer: Self-pay

## 2018-11-05 ENCOUNTER — Ambulatory Visit (INDEPENDENT_AMBULATORY_CARE_PROVIDER_SITE_OTHER): Payer: Medicare Other | Admitting: Family Medicine

## 2018-11-05 ENCOUNTER — Other Ambulatory Visit: Payer: Self-pay

## 2018-11-05 ENCOUNTER — Encounter: Payer: Self-pay | Admitting: Family Medicine

## 2018-11-05 VITALS — BP 124/69 | HR 68 | Temp 97.7°F | Resp 16 | Ht 67.0 in | Wt 210.0 lb

## 2018-11-05 DIAGNOSIS — E119 Type 2 diabetes mellitus without complications: Secondary | ICD-10-CM

## 2018-11-05 DIAGNOSIS — Z1211 Encounter for screening for malignant neoplasm of colon: Secondary | ICD-10-CM

## 2018-11-05 DIAGNOSIS — E7800 Pure hypercholesterolemia, unspecified: Secondary | ICD-10-CM | POA: Diagnosis not present

## 2018-11-05 DIAGNOSIS — I25810 Atherosclerosis of coronary artery bypass graft(s) without angina pectoris: Secondary | ICD-10-CM

## 2018-11-05 LAB — POCT GLYCOSYLATED HEMOGLOBIN (HGB A1C)
Est. average glucose Bld gHb Est-mCnc: 154
Hemoglobin A1C: 7 % — AB (ref 4.0–5.6)

## 2018-11-05 MED ORDER — NA SULFATE-K SULFATE-MG SULF 17.5-3.13-1.6 GM/177ML PO SOLN: 1.0000 | Freq: Once | ORAL | 0 refills | Status: AC

## 2018-11-05 NOTE — Patient Instructions (Signed)
1. Type 2 diabetes mellitus with complication, without long-term current use of insulin (HCC) With CAD.  2. Pure hypercholesterolemia  3. Essential hypertension  4. Gout, unspecified cause, unspecified chronicity, unspecified site  5. CAD All risk factors treated.

## 2018-11-05 NOTE — Progress Notes (Signed)
Patient: Todd Olson Male    DOB: 1940/05/12   78 y.o.   MRN: [id number] Visit Date: 11/05/2018  Today's Provider: [name], MD   Chief Complaint  Patient presents with  . Follow-up  . Diabetes  . Hyperlipidemia  . Hypertension   Subjective:     HPI   Type 2 diabetes mellitus with complication, without long-term current use of insulin (HCC) With CAD. From 07/04/2018-labs checked, no changes.   Patient's fasting blood sugars at home are 150-160  Pure hypercholesterolemia From 07/04/2018-labs checked, switched lovastatin to rosuvastatin 20 mg qd.  Essential hypertension             From 07/04/2018-labs checked, no changes.  Gout, unspecified cause, unspecified chronicity, unspecified site From 07/04/2018-labs checked, no changes.  CAD From 07/04/2018-labs checked, no changes.   Allergies  Allergen Reactions  . Oxycodone Other (See Comments)    hallucination  . Penicillins Rash    Rash as a child.  Has not taken since then Has patient had a PCN reaction causing immediate rash, facial/tongue/throat swelling, SOB or lightheadedness with hypotension: Yes Has patient had a PCN reaction causing severe rash involving mucus membranes or skin necrosis: No Has patient had a PCN reaction that required hospitalization: No Has patient had a PCN reaction occurring within the last 10 years: No If all of the above answers are "NO", then may proceed with Cephalosporin use.      Current Outpatient Medications:  .  allopurinol (ZYLOPRIM) 100 MG tablet, Take 1 tablet (100 mg total) by mouth daily. Start on Feb 20,2020, Disp: 30 tablet, Rfl: 6 .  amLODipine (NORVASC) 5 MG tablet, Take 1 tablet (5 mg total) by mouth daily., Disp: 30 tablet, Rfl: 12 .  aspirin 81 MG tablet, Take 81 mg by mouth every other day. At night, Disp: , Rfl:  .  Cholecalciferol 1000 UNITS capsule, Take 1,000 Units by mouth daily. , Disp: , Rfl:  .  clopidogrel (PLAVIX) 75 MG tablet, TAKE 1 TABLET BY  MOUTH ONCE DAILY, Disp: 30 tablet, Rfl: 12 .  colchicine 0.6 MG tablet, Take 1 tablet (0.6 mg total) by mouth 2 (two) times daily., Disp: 60 tablet, Rfl: 11 .  colchicine 0.6 MG tablet, Take 1 tablet (0.6 mg total) by mouth daily., Disp: 30 tablet, Rfl: 5 .  ferrous sulfate 325 (65 FE) MG tablet, Take 325 mg by mouth daily., Disp: , Rfl:  .  furosemide (LASIX) 40 MG tablet, TAKE 1 TABLET BY MOUTH ONCE DAILY, Disp: 90 tablet, Rfl: 3 .  Glucosamine-Chondroit-Vit C-Mn (GLUCOSAMINE CHONDR 1500 COMPLX) CAPS, Take 1 capsule by mouth 2 (two) times daily. , Disp: , Rfl:  .  lisinopril-hydrochlorothiazide (ZESTORETIC) 20-12.5 MG tablet, Take 1 tablet by mouth once daily, Disp: 90 tablet, Rfl: 0 .  lovastatin (MEVACOR) 40 MG tablet, TAKE 1 TABLET BY MOUTH AT BEDTIME (Patient taking differently: Take 40 mg by mouth every other day. ), Disp: 90 tablet, Rfl: 3 .  magnesium oxide (MAG-OX) 400 (241.3 MG) MG tablet, Take 400 mg by mouth 2 (two) times daily. , Disp: , Rfl:  .  metFORMIN (GLUCOPHAGE) 1000 MG tablet, TAKE 1 TABLET BY MOUTH TWICE DAILY WITH MEALS, Disp: 180 tablet, Rfl: 4 .  metoprolol tartrate (LOPRESSOR) 25 MG tablet, Take 1 tablet by mouth twice daily, Disp: 180 tablet, Rfl: 3 .  Omega-3 Fatty Acids (FISH OIL) 1000 MG CAPS, Take 525 mg by mouth daily. , Disp: , Rfl:  .  ondansetron (ZOFRAN) 4 MG tablet, Take 1 tablet (4 mg total) by mouth every 8 (eight) hours as needed for nausea or vomiting., Disp: 30 tablet, Rfl: 0 .  ONETOUCH ULTRA test strip, USE TO CHECK BLOOD SUGAR TWICE DAILY, Disp: 100 each, Rfl: 5 .  pioglitazone (ACTOS) 45 MG tablet, Take 1 tablet (45 mg total) by mouth daily. Take one tablet daily, Disp: 90 tablet, Rfl: 0 .  rosuvastatin (CRESTOR) 10 MG tablet, Take 1 tablet (10 mg total) by mouth daily., Disp: 90 tablet, Rfl: 3  Review of Systems  Constitutional: Negative for appetite change, chills and fever.  Eyes: Negative.   Respiratory: Negative for chest tightness, shortness of  breath and wheezing.   Cardiovascular: Negative for chest pain and palpitations.  Gastrointestinal: Negative for abdominal pain, nausea and vomiting.  Endocrine: Negative.   Musculoskeletal: Negative.   Allergic/Immunologic: Negative.   Neurological: Negative.   Hematological: Negative.   Psychiatric/Behavioral: Negative.     Social History   Tobacco Use  . Smoking status: Former Smoker    Quit date: 11/17/1977    Years since quitting: 40.9  . Smokeless tobacco: Never Used  Substance Use Topics  . Alcohol use: No    Alcohol/week: 0.0 standard drinks      Objective:   BP 124/69 (BP Location: Left Arm, Patient Position: Sitting, Cuff Size: Large)   Pulse 68   Temp 97.7 F (36.5 C) (Oral)   Resp 16   Ht 5\' 7"  (1.702 m)   Wt 210 lb (95.3 kg)   SpO2 96%   BMI 32.89 kg/m  Vitals:   11/05/18 1005  BP: 124/69  Pulse: 68  Resp: 16  Temp: 97.7 F (36.5 C)  TempSrc: Oral  SpO2: 96%  Weight: 210 lb (95.3 kg)  Height: 5\' 7"  (1.702 m)     Physical Exam Vitals signs reviewed.  Constitutional:      Appearance: He is well-developed.  HENT:     Head: Normocephalic and atraumatic.     Right Ear: External ear normal.     Left Ear: External ear normal.     Nose: Nose normal.  Eyes:     General: No scleral icterus.    Conjunctiva/sclera: Conjunctivae normal.  Neck:     Thyroid: No thyromegaly.     Vascular: No carotid bruit.  Cardiovascular:     Rate and Rhythm: Normal rate and regular rhythm.     Heart sounds: Normal heart sounds.  Pulmonary:     Effort: Pulmonary effort is normal.     Breath sounds: Normal breath sounds.  Abdominal:     Palpations: Abdomen is soft.  Lymphadenopathy:     Cervical: No cervical adenopathy.  Skin:    General: Skin is warm and dry.  Neurological:     Mental Status: He is alert and oriented to person, place, and time.  Psychiatric:        Mood and Affect: Mood normal.        Behavior: Behavior normal.        Thought Content:  Thought content normal.        Judgment: Judgment normal.      Results for orders placed or performed in visit on 11/05/18  POCT glycosylated hemoglobin (Hb A1C)  Result Value Ref Range   Hemoglobin A1C 7.0 (A) 4.0 - 5.6 %   Est. average glucose Bld gHb Est-mCnc 154        Assessment & Plan    1. Type 2 diabetes  mellitus without complication, without long-term current use of insulin (HCC) Controlled. RTC 4 months. - POCT glycosylated hemoglobin (Hb A1C)--7.0--was 6.7  2. Screening for colon cancer  - Ambulatory referral to Gastroenterology  3. Arteriosclerosis of nonautologous coronary artery bypass graft All risk factors treated  4. Pure hypercholesterolemia On Crestor.  I,[name],acting as a scribe for [name], MD.,have documented all relevant documentation on the behalf of [name], MD,as directed by  [name], MD while in the presence of [name], MD.   [name], MD  [facility]

## 2018-11-05 NOTE — Telephone Encounter (Signed)
Gastroenterology Pre-Procedure Review  Request Date: 11/12/18 Requesting Physician: Dr. Allen Norris  PATIENT REVIEW QUESTIONS: The patient responded to the following health history questions as indicated:    1. Are you having any GI issues? no 2. Do you have a personal history of Polyps? yes (unsure of the year) 3. Do you have a family history of Colon Cancer or Polyps? no 4. Diabetes Mellitus? no 5. Joint replacements in the past 12 months?no 6. Major health problems in the past 3 months?no 7. Any artificial heart valves, MVP, or defibrillator?no    MEDICATIONS & ALLERGIES:    Patient reports the following regarding taking any anticoagulation/antiplatelet therapy:   Plavix, Coumadin, Eliquis, Xarelto, Lovenox, Pradaxa, Brilinta, or Effient? yes (Plavix Request sent to Dr. Marlan Palau office.) Aspirin? yes (81 mg daily)  Patient confirms/reports the following medications:  Current Outpatient Medications  Medication Sig Dispense Refill  . allopurinol (ZYLOPRIM) 100 MG tablet Take 1 tablet (100 mg total) by mouth daily. Start on Feb 20,2020 30 tablet 6  . amLODipine (NORVASC) 5 MG tablet Take 1 tablet (5 mg total) by mouth daily. 30 tablet 12  . aspirin 81 MG tablet Take 81 mg by mouth every other day. At night    . Cholecalciferol 1000 UNITS capsule Take 1,000 Units by mouth daily.     . clopidogrel (PLAVIX) 75 MG tablet TAKE 1 TABLET BY MOUTH ONCE DAILY 30 tablet 12  . colchicine 0.6 MG tablet Take 1 tablet (0.6 mg total) by mouth 2 (two) times daily. 60 tablet 11  . colchicine 0.6 MG tablet Take 1 tablet (0.6 mg total) by mouth daily. 30 tablet 5  . ferrous sulfate 325 (65 FE) MG tablet Take 325 mg by mouth daily.    . furosemide (LASIX) 40 MG tablet TAKE 1 TABLET BY MOUTH ONCE DAILY 90 tablet 3  . Glucosamine-Chondroit-Vit C-Mn (GLUCOSAMINE CHONDR 1500 COMPLX) CAPS Take 1 capsule by mouth 2 (two) times daily.     Marland Kitchen lisinopril-hydrochlorothiazide (ZESTORETIC) 20-12.5 MG tablet Take 1 tablet  by mouth once daily 90 tablet 0  . lovastatin (MEVACOR) 40 MG tablet TAKE 1 TABLET BY MOUTH AT BEDTIME (Patient taking differently: Take 40 mg by mouth every other day. ) 90 tablet 3  . magnesium oxide (MAG-OX) 400 (241.3 MG) MG tablet Take 400 mg by mouth 2 (two) times daily.     . metFORMIN (GLUCOPHAGE) 1000 MG tablet TAKE 1 TABLET BY MOUTH TWICE DAILY WITH MEALS 180 tablet 4  . metoprolol tartrate (LOPRESSOR) 25 MG tablet Take 1 tablet by mouth twice daily 180 tablet 3  . Na Sulfate-K Sulfate-Mg Sulf 17.5-3.13-1.6 GM/177ML SOLN Take 1 kit by mouth once for 1 dose. 354 mL 0  . Omega-3 Fatty Acids (FISH OIL) 1000 MG CAPS Take 525 mg by mouth daily.     . ondansetron (ZOFRAN) 4 MG tablet Take 1 tablet (4 mg total) by mouth every 8 (eight) hours as needed for nausea or vomiting. 30 tablet 0  . ONETOUCH ULTRA test strip USE TO CHECK BLOOD SUGAR TWICE DAILY 100 each 5  . pioglitazone (ACTOS) 45 MG tablet Take 1 tablet (45 mg total) by mouth daily. Take one tablet daily 90 tablet 0  . rosuvastatin (CRESTOR) 10 MG tablet Take 1 tablet (10 mg total) by mouth daily. 90 tablet 3   No current facility-administered medications for this visit.     Patient confirms/reports the following allergies:  Allergies  Allergen Reactions  . Oxycodone Other (See Comments)  hallucination  . Penicillins Rash    Rash as a child.  Has not taken since then Has patient had a PCN reaction causing immediate rash, facial/tongue/throat swelling, SOB or lightheadedness with hypotension: Yes Has patient had a PCN reaction causing severe rash involving mucus membranes or skin necrosis: No Has patient had a PCN reaction that required hospitalization: No Has patient had a PCN reaction occurring within the last 10 years: No If all of the above answers are "NO", then may proceed with Cephalosporin use.     No orders of the defined types were placed in this encounter.   AUTHORIZATION INFORMATION Primary  Insurance: 1D#: Group #:  Secondary Insurance: 1D#: Group #:  SCHEDULE INFORMATION: Date: 11/12/18 Time: Location:[facility]

## 2018-11-07 ENCOUNTER — Telehealth: Payer: Self-pay | Admitting: Family Medicine

## 2018-11-07 NOTE — Telephone Encounter (Signed)
Pt is having his colonoscopy on Monday   He wants to know what medication to stop and when.  Especially the Plavix  CB#  [phone]  Thanks teri

## 2018-11-07 NOTE — Telephone Encounter (Signed)
Last dose today

## 2018-11-07 NOTE — Telephone Encounter (Signed)
Patient has been advised. KW 

## 2018-11-07 NOTE — Telephone Encounter (Signed)
Please advise. Thanks.  

## 2018-11-08 ENCOUNTER — Other Ambulatory Visit: Payer: Self-pay

## 2018-11-08 ENCOUNTER — Telehealth: Payer: Self-pay

## 2018-11-08 ENCOUNTER — Other Ambulatory Visit
Admission: RE | Admit: 2018-11-08 | Discharge: 2018-11-08 | Disposition: A | Discharge status: Home / Self Care | Payer: Medicare Other | Source: Ambulatory Visit | Attending: Gastroenterology | Admitting: Gastroenterology

## 2018-11-08 DIAGNOSIS — Z01812 Encounter for preprocedural laboratory examination: Secondary | ICD-10-CM | POA: Insufficient documentation

## 2018-11-08 DIAGNOSIS — Z1159 Encounter for screening for other viral diseases: Secondary | ICD-10-CM | POA: Diagnosis not present

## 2018-11-08 DIAGNOSIS — Z1211 Encounter for screening for malignant neoplasm of colon: Secondary | ICD-10-CM

## 2018-11-08 NOTE — Telephone Encounter (Signed)
Dr. Marlan Palau office has been contacted in regards to patients blood thinner request. Colonoscopy is Monday July 13th. I spoke with Rashell, one of [facility] nurses, she said she believes Dr. Rosanna Randy has received the blood thinner request and has it with him.  I've asked her to have someone from their office to contact him in regards to stopping his blood thinner as our office is closed on Fridays.  Thanks [employer]

## 2018-11-09 LAB — SARS CORONAVIRUS 2 (TAT 6-24 HRS): SARS Coronavirus 2: NEGATIVE

## 2018-11-09 NOTE — Discharge Instructions (Signed)

## 2018-11-12 ENCOUNTER — Other Ambulatory Visit: Payer: Self-pay

## 2018-11-12 ENCOUNTER — Ambulatory Visit: Payer: Medicare Other | Admitting: Anesthesiology

## 2018-11-12 ENCOUNTER — Encounter
Admission: RE | Disposition: A | Discharge status: Home / Self Care | Payer: Self-pay | Source: Home / Self Care | Attending: Gastroenterology

## 2018-11-12 ENCOUNTER — Ambulatory Visit
Admission: RE | Admit: 2018-11-12 | Discharge: 2018-11-12 | Disposition: A | Discharge status: Home / Self Care | Payer: Medicare Other | Attending: Gastroenterology | Admitting: Gastroenterology

## 2018-11-12 ENCOUNTER — Ambulatory Visit: Admit: 2018-11-12 | Payer: Medicare PPO | Admitting: Gastroenterology

## 2018-11-12 DIAGNOSIS — Z1211 Encounter for screening for malignant neoplasm of colon: Secondary | ICD-10-CM | POA: Diagnosis not present

## 2018-11-12 DIAGNOSIS — D649 Anemia, unspecified: Secondary | ICD-10-CM | POA: Insufficient documentation

## 2018-11-12 DIAGNOSIS — Z7902 Long term (current) use of antithrombotics/antiplatelets: Secondary | ICD-10-CM | POA: Diagnosis not present

## 2018-11-12 DIAGNOSIS — K5730 Diverticulosis of large intestine without perforation or abscess without bleeding: Secondary | ICD-10-CM | POA: Diagnosis not present

## 2018-11-12 DIAGNOSIS — E119 Type 2 diabetes mellitus without complications: Secondary | ICD-10-CM | POA: Insufficient documentation

## 2018-11-12 DIAGNOSIS — I252 Old myocardial infarction: Secondary | ICD-10-CM | POA: Diagnosis not present

## 2018-11-12 DIAGNOSIS — K449 Diaphragmatic hernia without obstruction or gangrene: Secondary | ICD-10-CM | POA: Diagnosis not present

## 2018-11-12 DIAGNOSIS — I110 Hypertensive heart disease with heart failure: Secondary | ICD-10-CM | POA: Insufficient documentation

## 2018-11-12 DIAGNOSIS — Z87891 Personal history of nicotine dependence: Secondary | ICD-10-CM | POA: Insufficient documentation

## 2018-11-12 DIAGNOSIS — Z8249 Family history of ischemic heart disease and other diseases of the circulatory system: Secondary | ICD-10-CM | POA: Insufficient documentation

## 2018-11-12 DIAGNOSIS — Z885 Allergy status to narcotic agent status: Secondary | ICD-10-CM | POA: Insufficient documentation

## 2018-11-12 DIAGNOSIS — K640 First degree hemorrhoids: Secondary | ICD-10-CM | POA: Insufficient documentation

## 2018-11-12 DIAGNOSIS — I509 Heart failure, unspecified: Secondary | ICD-10-CM | POA: Insufficient documentation

## 2018-11-12 DIAGNOSIS — Z7982 Long term (current) use of aspirin: Secondary | ICD-10-CM | POA: Insufficient documentation

## 2018-11-12 DIAGNOSIS — I2510 Atherosclerotic heart disease of native coronary artery without angina pectoris: Secondary | ICD-10-CM | POA: Diagnosis not present

## 2018-11-12 DIAGNOSIS — Z880 Allergy status to penicillin: Secondary | ICD-10-CM | POA: Insufficient documentation

## 2018-11-12 DIAGNOSIS — Z7984 Long term (current) use of oral hypoglycemic drugs: Secondary | ICD-10-CM | POA: Insufficient documentation

## 2018-11-12 HISTORY — DX: Complete loss of teeth, unspecified cause, unspecified class: Z97.2

## 2018-11-12 HISTORY — PX: COLONOSCOPY WITH PROPOFOL: SHX5780

## 2018-11-12 HISTORY — DX: Complete loss of teeth, unspecified cause, unspecified class: K08.109

## 2018-11-12 LAB — GLUCOSE, CAPILLARY
Glucose-Capillary: 139 mg/dL — ABNORMAL HIGH (ref 70–99)
Glucose-Capillary: 141 mg/dL — ABNORMAL HIGH (ref 70–99)

## 2018-11-12 SURGERY — COLONOSCOPY WITH PROPOFOL
Anesthesia: General

## 2018-11-12 MED ORDER — STERILE WATER FOR IRRIGATION IR SOLN
Status: DC | PRN
  Administered 2018-11-12: 10:00:00 15 mL

## 2018-11-12 MED ORDER — SODIUM CHLORIDE 0.9 % IV SOLN: INTRAVENOUS | Status: DC

## 2018-11-12 MED ORDER — PROPOFOL 10 MG/ML IV BOLUS
INTRAVENOUS | Status: DC | PRN
  Administered 2018-11-12 (×3): 100 mg via INTRAVENOUS

## 2018-11-12 MED ORDER — LIDOCAINE HCL (CARDIAC) PF 100 MG/5ML IV SOSY
PREFILLED_SYRINGE | INTRAVENOUS | Status: DC | PRN
  Administered 2018-11-12: 30 mg via INTRAVENOUS

## 2018-11-12 MED ORDER — LACTATED RINGERS IV SOLN
INTRAVENOUS | Status: DC
  Administered 2018-11-12: 09:00:00 via INTRAVENOUS

## 2018-11-12 MED ORDER — ACETAMINOPHEN 325 MG PO TABS: 325.0000 mg | ORAL_TABLET | Freq: Once | ORAL | Status: DC

## 2018-11-12 MED ORDER — ACETAMINOPHEN 160 MG/5ML PO SOLN: 325.0000 mg | Freq: Once | ORAL | Status: DC

## 2018-11-12 SURGICAL SUPPLY — 24 items
CANISTER SUCT 1200ML W/VALVE (MISCELLANEOUS) ×3 IMPLANT
CLIP HMST 235XBRD CATH ROT (MISCELLANEOUS) IMPLANT
CLIP RESOLUTION 360 11X235 (MISCELLANEOUS)
ELECT REM PT RETURN 9FT ADLT (ELECTROSURGICAL)
ELECTRODE REM PT RTRN 9FT ADLT (ELECTROSURGICAL) IMPLANT
FCP ESCP3.2XJMB 240X2.8X (MISCELLANEOUS)
FORCEPS BIOP RAD 4 LRG CAP 4 (CUTTING FORCEPS) IMPLANT
FORCEPS BIOP RJ4 240 W/NDL (MISCELLANEOUS)
FORCEPS ESCP3.2XJMB 240X2.8X (MISCELLANEOUS) IMPLANT
GOWN CVR UNV OPN BCK APRN NK (MISCELLANEOUS) ×2 IMPLANT
GOWN ISOL THUMB LOOP REG UNIV (MISCELLANEOUS) ×4
INJECTOR VARIJECT VIN23 (MISCELLANEOUS) IMPLANT
KIT DEFENDO VALVE AND CONN (KITS) IMPLANT
KIT ENDO PROCEDURE OLY (KITS) ×3 IMPLANT
MARKER SPOT ENDO TATTOO 5ML (MISCELLANEOUS) IMPLANT
PROBE APC STR FIRE (PROBE) IMPLANT
RETRIEVER NET ROTH 2.5X230 LF (MISCELLANEOUS) IMPLANT
SNARE SHORT THROW 13M SML OVAL (MISCELLANEOUS) ×3 IMPLANT
SNARE SHORT THROW 30M LRG OVAL (MISCELLANEOUS) IMPLANT
SNARE SNG USE RND 15MM (INSTRUMENTS) IMPLANT
SPOT EX ENDOSCOPIC TATTOO (MISCELLANEOUS)
TRAP ETRAP POLY (MISCELLANEOUS) IMPLANT
VARIJECT INJECTOR VIN23 (MISCELLANEOUS)
WATER STERILE IRR 250ML POUR (IV SOLUTION) ×3 IMPLANT

## 2018-11-12 NOTE — H&P (Signed)
Lucilla Lame, MD [address] [place], [place] [address] Phone: [phone] Fax : [phone]  Primary Care Physician:  Jerrol Banana., MD Primary Gastroenterologist:  Dr. Allen Norris  Pre-Procedure History & Physical: HPI:  Todd Olson is a 78 y.o. male is here for a screening colonoscopy.   Past Medical History:  Diagnosis Date  . Anemia   . CHF (congestive heart failure) ([place])   . Coronary artery disease   . Diabetes mellitus without complication ([place])    type 2  . Full dentures    wears upper  . GI bleeding    upper and lower  . Heart attack ([place]) 04/05/2002  . History of hiatal hernia   . Hypertension    controlled on meds  . Myocardial infarction ([place])    X 4 83', 84',86' and LAST 2003    Past Surgical History:  Procedure Laterality Date  . BACK SURGERY  2016   laminectomy lumbar  . CATARACT EXTRACTION W/PHACO Left 12/06/2016   Procedure: CATARACT EXTRACTION PHACO AND INTRAOCULAR LENS PLACEMENT (IOC);  Surgeon: Birder Robson, MD;  Location: [facility] ORS;  Service: Ophthalmology;  Laterality: Left;  [place] 00:31 AP% 15.6 CDE 4.99 Fluid pack lot # [id number]  . CATARACT EXTRACTION W/PHACO Right 12/27/2016   Procedure: CATARACT EXTRACTION PHACO AND INTRAOCULAR LENS PLACEMENT (IOC);  Surgeon: Birder Robson, MD;  Location: [facility] ORS;  Service: Ophthalmology;  Laterality: Right;  [place] 00:46 AP% 12.5 CDE 5.79 Fluid pack lot # [id number]  . CHOLECYSTECTOMY  2013   Dr. Rochel Brome  . colonosopy  2010, 2013, 2014  . CORONARY ANGIOPLASTY     STENT PLACEMENT X1  . EYE SURGERY     bilateral  . FOOT SURGERY  1996  . HEMORROIDECTOMY  2014  . RECTAL SURGERY     repair of bleed after hemrrhoid surgery  . SHOULDER ARTHROSCOPY WITH OPEN ROTATOR CUFF REPAIR AND DISTAL CLAVICLE ACROMINECTOMY Right 08/15/2017   Procedure: right shoulder arthroscopy, arthroscopic subacromial decompression, distal clavicle excision, biceps tenotomy, open rotator cuff repair;   Surgeon: Thornton Park, MD;  Location: [facility] ORS;  Service: Orthopedics;  Laterality: Right;  . SHOULDER SURGERY Left 2015    Prior to Admission medications   Medication Sig Start Date End Date Taking? Authorizing Provider  allopurinol (ZYLOPRIM) 100 MG tablet Take 1 tablet (100 mg total) by mouth daily. Start on Feb 20,2020 Patient taking differently: Take 100 mg by mouth daily. Am Start on Feb 20,2020 06/13/18  Yes Jerrol Banana., MD  amLODipine (NORVASC) 5 MG tablet Take 1 tablet (5 mg total) by mouth daily. Patient taking differently: Take 5 mg by mouth daily. am 07/23/18  Yes Jerrol Banana., MD  colchicine 0.6 MG tablet Take 1 tablet (0.6 mg total) by mouth daily. Patient taking differently: Take 0.6 mg by mouth daily. am 07/04/18  Yes Jerrol Banana., MD  furosemide (LASIX) 40 MG tablet TAKE 1 TABLET BY MOUTH ONCE DAILY Patient taking differently: Take 40 mg by mouth daily. am 11/30/17  Yes Jerrol Banana., MD  Glucosamine-Chondroit-Vit C-Mn (GLUCOSAMINE CHONDR 1500 COMPLX) CAPS Take 1 capsule by mouth 2 (two) times daily.  06/01/10  Yes [provider]  lisinopril-hydrochlorothiazide (ZESTORETIC) 20-12.5 MG tablet Take 1 tablet by mouth once daily Patient taking differently: Take 1 tablet by mouth daily. am 09/19/18  Yes Carles Collet M, PA-C  magnesium oxide (MAG-OX) 400 (241.3 MG) MG tablet Take 400 mg by mouth 2 (two)  times daily. Am and pm 03/12/12  Yes [provider]  metFORMIN (GLUCOPHAGE) 1000 MG tablet TAKE 1 TABLET BY MOUTH TWICE DAILY WITH MEALS Patient taking differently: Take 1,000 mg by mouth 2 (two) times daily with a meal. Am and pm 07/31/18  Yes Jerrol Banana., MD  metoprolol tartrate (LOPRESSOR) 25 MG tablet Take 1 tablet by mouth twice daily Patient taking differently: Take 25 mg by mouth 2 (two) times daily. Am and pm 10/23/18  Yes Jerrol Banana., MD  pioglitazone (ACTOS) 45 MG tablet Take 1 tablet (45 mg  total) by mouth daily. Take one tablet daily Patient taking differently: Take 45 mg by mouth daily. Take one tablet daily/am 10/05/18  Yes Jerrol Banana., MD  rosuvastatin (CRESTOR) 10 MG tablet Take 1 tablet (10 mg total) by mouth daily. Patient taking differently: Take 10 mg by mouth daily. bedtime 07/10/18  Yes Jerrol Banana., MD  aspirin 81 MG tablet Take 81 mg by mouth every other day. At night 12/28/12   [provider]  Cholecalciferol 1000 UNITS capsule Take 1,000 Units by mouth daily. am 06/01/10   [provider]  clopidogrel (PLAVIX) 75 MG tablet TAKE 1 TABLET BY MOUTH ONCE DAILY Patient not taking: am 12/11/17   Jerrol Banana., MD  colchicine 0.6 MG tablet Take 1 tablet (0.6 mg total) by mouth 2 (two) times daily. Patient not taking: Reported on 11/12/2018 06/13/18   Jerrol Banana., MD  ferrous sulfate 325 (65 FE) MG tablet Take 325 mg by mouth daily.    [provider]  lovastatin (MEVACOR) 40 MG tablet TAKE 1 TABLET BY MOUTH AT BEDTIME Patient not taking: No sig reported 02/23/18   Jerrol Banana., MD  Omega-3 Fatty Acids (FISH OIL) 1000 MG CAPS Take 525 mg by mouth daily.     [provider]  ondansetron (ZOFRAN) 4 MG tablet Take 1 tablet (4 mg total) by mouth every 8 (eight) hours as needed for nausea or vomiting. Patient not taking: Reported on 11/08/2018 08/15/17   Thornton Park, MD  [facility] ULTRA test strip USE TO CHECK BLOOD SUGAR TWICE DAILY 10/02/18   Jerrol Banana., MD    Allergies as of 11/08/2018 - Review Complete 11/08/2018  Allergen Reaction Noted  . Oxycodone Other (See Comments)   . Penicillins Rash 09/10/2014    Family History  Problem Relation Age of Onset  . Heart disease Mother   . Heart disease Father   . Hypertension Father   . Bipolar disorder Sister        with hospitalizations  . Diabetes Brother   . Hypertension Brother   . Heart disease Brother   . Diabetes Brother   .  Heart disease Brother        has stents    Social History   Socioeconomic History  . Marital status: Married    Spouse name: Not on file  . Number of children: 3  . Years of education: Not on file  . Highest education level: Associate degree: occupational, Hotel manager, or vocational program  Occupational History  . Occupation: retired  Scientific laboratory technician  . Financial resource strain: Not hard at all  . Food insecurity    Worry: Never true    Inability: Never true  . Transportation needs    Medical: No    Non-medical: No  Tobacco Use  . Smoking status: Former Smoker    Packs/day: 2.00  Years: 22.00    Pack years: 44.00    Types: Cigarettes    Quit date: 11/17/1977    Years since quitting: 41.0  . Smokeless tobacco: Never Used  Substance and Sexual Activity  . Alcohol use: No    Alcohol/week: 0.0 standard drinks  . Drug use: No  . Sexual activity: Not Currently  Lifestyle  . Physical activity    Days per week: 0 days    Minutes per session: 0 min  . Stress: Not at all  Relationships  . Social Herbalist on phone: Patient refused    Gets together: Patient refused    Attends religious service: Patient refused    Active member of club or organization: Patient refused    Attends meetings of clubs or organizations: Patient refused    Relationship status: Patient refused  . Intimate partner violence    Fear of current or ex partner: No    Emotionally abused: No    Physically abused: No    Forced sexual activity: No  Other Topics Concern  . Not on file  Social History Narrative  . Not on file    Review of Systems: See HPI, otherwise negative ROS  Physical Exam: BP (!) 142/69   Pulse 67   Temp 98.1 F (36.7 C) (Temporal)   Resp 19   Ht 5\' 9"  (1.753 m)   Wt 92.1 kg   SpO2 98%   BMI 29.98 kg/m  General:   Alert,  pleasant and cooperative in NAD Head:  Normocephalic and atraumatic. Neck:  Supple; no masses or thyromegaly. Lungs:  Clear throughout to  auscultation.    Heart:  Regular rate and rhythm. Abdomen:  Soft, nontender and nondistended. Normal bowel sounds, without guarding, and without rebound.   Neurologic:  Alert and  oriented x4;  grossly normal neurologically.  Impression/Plan: Todd Olson is now here to undergo a screening colonoscopy.  Risks, benefits, and alternatives regarding colonoscopy have been reviewed with the patient.  Questions have been answered.  All parties agreeable.

## 2018-11-12 NOTE — Anesthesia Procedure Notes (Signed)
Procedure Name: General with mask airway Performed by: Shanette Tamargo M, CRNA Pre-anesthesia Checklist: Patient identified, Emergency Drugs available, Suction available, Patient being monitored and Timeout performed Patient Re-evaluated:Patient Re-evaluated prior to induction Oxygen Delivery Method: Nasal cannula       

## 2018-11-12 NOTE — Anesthesia Preprocedure Evaluation (Signed)
Anesthesia Evaluation  Patient identified by MRN, date of birth, ID band Patient awake    Reviewed: Allergy & Precautions, H&P , NPO status , Patient's Chart, lab work & pertinent test results  Airway Mallampati: II  TM Distance: >3 FB Neck ROM: full    Dental  (+) Upper Dentures, Edentulous Lower   Pulmonary former smoker,    Pulmonary exam normal breath sounds clear to auscultation       Cardiovascular hypertension, + CAD, + Past MI and +CHF  Normal cardiovascular exam Rhythm:regular Rate:Normal     Neuro/Psych    GI/Hepatic   Endo/Other  diabetes  Renal/GU      Musculoskeletal   Abdominal   Peds  Hematology   Anesthesia Other Findings   Reproductive/Obstetrics                             Anesthesia Physical Anesthesia Plan  ASA: III  Anesthesia Plan: General   Post-op Pain Management:    Induction: Intravenous  PONV Risk Score and Plan: 2 and Propofol infusion and Treatment may vary due to age or medical condition  Airway Management Planned: Natural Airway  Additional Equipment:   Intra-op Plan:   Post-operative Plan:   Informed Consent: I have reviewed the patients History and Physical, chart, labs and discussed the procedure including the risks, benefits and alternatives for the proposed anesthesia with the patient or authorized representative who has indicated his/her understanding and acceptance.       Plan Discussed with: CRNA  Anesthesia Plan Comments:         Anesthesia Quick Evaluation

## 2018-11-12 NOTE — Op Note (Signed)
[facility] Patient Name: Todd Olson Procedure Date: 11/12/2018 9:26 AM MRN: [id number] Account #: [id number] Date of Birth: 28-Nov-1940 Admit Type: Outpatient Age: 78 Room: [facility] OR ROOM 01 Gender: Male Note Status: Finalized Procedure:            Colonoscopy Indications:          Screening for colorectal malignant neoplasm Providers:            Lucilla Lame MD, MD Referring MD:         Janine Ores. Rosanna Randy, MD (Referring MD) Medicines:            Propofol per Anesthesia Complications:        No immediate complications. Procedure:            Pre-Anesthesia Assessment:                       - Prior to the procedure, a History and Physical was                        performed, and patient medications and allergies were                        reviewed. The patient's tolerance of previous                        anesthesia was also reviewed. The risks and benefits of                        the procedure and the sedation options and risks were                        discussed with the patient. All questions were                        answered, and informed consent was obtained. Prior                        Anticoagulants: The patient has taken no previous                        anticoagulant or antiplatelet agents. ASA Grade                        Assessment: II - A patient with mild systemic disease.                        After reviewing the risks and benefits, the patient was                        deemed in satisfactory condition to undergo the                        procedure.                       After obtaining informed consent, the colonoscope was                        passed under direct vision. Throughout the procedure,  the patient's blood pressure, pulse, and oxygen                        saturations were monitored continuously. The was                        introduced through the anus and advanced to the the                 cecum, identified by appendiceal orifice and ileocecal                        valve. The colonoscopy was performed without                        difficulty. The patient tolerated the procedure well.                        The quality of the bowel preparation was good. Findings:      The perianal and digital rectal examinations were normal.      Multiple small-mouthed diverticula were found in the sigmoid colon.      Non-bleeding internal hemorrhoids were found during retroflexion. The       hemorrhoids were Grade I (internal hemorrhoids that do not prolapse). Impression:           - Diverticulosis in the sigmoid colon.                       - Non-bleeding internal hemorrhoids.                       - No specimens collected. Recommendation:       - Discharge patient to home.                       - Resume previous diet.                       - Continue present medications. Procedure Code(s):    --- Professional ---                       [phone], Colonoscopy, flexible; diagnostic, including                        collection of specimen(s) by brushing or washing, when                        performed (separate procedure) Diagnosis Code(s):    --- Professional ---                       Z12.11, Encounter for screening for malignant neoplasm                        of colon CPT copyright 2019 American Medical Association. All rights reserved. The codes documented in this report are preliminary and upon coder review may  be revised to meet current compliance requirements. Lucilla Lame MD, MD 11/12/2018 9:54:18 AM This report has been signed electronically. Number of Addenda: 0 Note Initiated On: 11/12/2018 9:26 AM Scope Withdrawal Time: 0 hours 7 minutes 3 seconds  Total Procedure Duration: 0 hours 16 minutes 32 seconds  Estimated Blood  Loss: Estimated blood loss: none.      [facility]

## 2018-11-12 NOTE — Transfer of Care (Signed)
Immediate Anesthesia Transfer of Care Note  Patient: Todd Olson  Procedure(s) Performed: COLONOSCOPY WITH PROPOFOL (N/A )  Patient Location: PACU  Anesthesia Type: General  Level of Consciousness: awake, alert  and patient cooperative  Airway and Oxygen Therapy: Patient Spontanous Breathing and Patient connected to supplemental oxygen  Post-op Assessment: Post-op Vital signs reviewed, Patient's Cardiovascular Status Stable, Respiratory Function Stable, Patent Airway and No signs of Nausea or vomiting  Post-op Vital Signs: Reviewed and stable  Complications: No apparent anesthesia complications

## 2018-11-12 NOTE — Anesthesia Postprocedure Evaluation (Signed)
Anesthesia Post Note  Patient: Todd Olson  Procedure(s) Performed: COLONOSCOPY WITH PROPOFOL (N/A )  Patient location during evaluation: PACU Anesthesia Type: General Level of consciousness: awake and alert and oriented Pain management: satisfactory to patient Vital Signs Assessment: post-procedure vital signs reviewed and stable Respiratory status: spontaneous breathing, nonlabored ventilation and respiratory function stable Cardiovascular status: blood pressure returned to baseline and stable Postop Assessment: Adequate PO intake and No signs of nausea or vomiting Anesthetic complications: no    Raliegh Ip

## 2018-11-12 NOTE — Anesthesia Procedure Notes (Signed)
Procedure Name: General with mask airway Performed by: Izetta Dakin, CRNA Pre-anesthesia Checklist: Timeout performed, Patient identified, Emergency Drugs available, Suction available and Patient being monitored Patient Re-evaluated:Patient Re-evaluated prior to induction Oxygen Delivery Method: Nasal cannula

## 2018-11-13 ENCOUNTER — Encounter: Payer: Self-pay | Admitting: Gastroenterology

## 2018-12-07 ENCOUNTER — Other Ambulatory Visit: Payer: Self-pay | Admitting: Family Medicine

## 2018-12-07 ENCOUNTER — Other Ambulatory Visit: Payer: Self-pay

## 2018-12-07 ENCOUNTER — Telehealth: Payer: Self-pay

## 2018-12-07 DIAGNOSIS — Z20822 Contact with and (suspected) exposure to covid-19: Secondary | ICD-10-CM

## 2018-12-07 DIAGNOSIS — Z20828 Contact with and (suspected) exposure to other viral communicable diseases: Secondary | ICD-10-CM

## 2018-12-07 NOTE — Telephone Encounter (Signed)
[facility] faxed refill request for the following medications: ? ?allopurinol (ZYLOPRIM) 100 MG tablet  ? ?Please advise. ? ?

## 2018-12-07 NOTE — Telephone Encounter (Signed)
Patient advised.

## 2018-12-07 NOTE — Telephone Encounter (Signed)
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 Neither one had on masks. They were in close contact for a few seconds. Not currently having symptoms. Exposure was Sunday. Please advise

## 2018-12-07 NOTE — Telephone Encounter (Signed)
Will order testing for covid at [facility]. He needs to isolate until test results are released. He has to wear mask to testing.

## 2018-12-08 LAB — NOVEL CORONAVIRUS, NAA: SARS-CoV-2, NAA: NOT DETECTED

## 2018-12-10 ENCOUNTER — Telehealth: Payer: Self-pay

## 2018-12-10 NOTE — Telephone Encounter (Signed)
Patient advised.

## 2018-12-10 NOTE — Telephone Encounter (Signed)
Please review. Thanks!  

## 2018-12-11 MED ORDER — ALLOPURINOL 100 MG PO TABS: 100.0000 mg | ORAL_TABLET | Freq: Every day | ORAL | 6 refills | Status: DC

## 2018-12-15 ENCOUNTER — Other Ambulatory Visit: Payer: Self-pay | Admitting: Family Medicine

## 2018-12-17 ENCOUNTER — Other Ambulatory Visit: Payer: Self-pay | Admitting: Physician Assistant

## 2018-12-26 ENCOUNTER — Other Ambulatory Visit: Payer: Self-pay | Admitting: Family Medicine

## 2018-12-26 DIAGNOSIS — I10 Essential (primary) hypertension: Secondary | ICD-10-CM

## 2018-12-31 ENCOUNTER — Other Ambulatory Visit: Payer: Self-pay | Admitting: Family Medicine

## 2018-12-31 DIAGNOSIS — E119 Type 2 diabetes mellitus without complications: Secondary | ICD-10-CM

## 2019-01-23 ENCOUNTER — Other Ambulatory Visit: Payer: Self-pay

## 2019-01-23 ENCOUNTER — Ambulatory Visit (INDEPENDENT_AMBULATORY_CARE_PROVIDER_SITE_OTHER): Payer: Medicare Other | Admitting: Family Medicine

## 2019-01-23 DIAGNOSIS — Z23 Encounter for immunization: Secondary | ICD-10-CM | POA: Diagnosis not present

## 2019-02-12 DIAGNOSIS — I2119 ST elevation (STEMI) myocardial infarction involving other coronary artery of inferior wall: Secondary | ICD-10-CM | POA: Diagnosis not present

## 2019-02-12 DIAGNOSIS — E785 Hyperlipidemia, unspecified: Secondary | ICD-10-CM | POA: Diagnosis not present

## 2019-02-12 DIAGNOSIS — I2510 Atherosclerotic heart disease of native coronary artery without angina pectoris: Secondary | ICD-10-CM | POA: Diagnosis not present

## 2019-02-12 DIAGNOSIS — I10 Essential (primary) hypertension: Secondary | ICD-10-CM | POA: Diagnosis not present

## 2019-02-12 DIAGNOSIS — I493 Ventricular premature depolarization: Secondary | ICD-10-CM | POA: Diagnosis not present

## 2019-02-28 ENCOUNTER — Other Ambulatory Visit: Payer: Self-pay

## 2019-02-28 ENCOUNTER — Other Ambulatory Visit: Payer: Self-pay | Admitting: Student

## 2019-02-28 ENCOUNTER — Ambulatory Visit
Admission: RE | Admit: 2019-02-28 | Discharge: 2019-02-28 | Disposition: A | Discharge status: Home / Self Care | Payer: Medicare Other | Source: Ambulatory Visit | Attending: Student | Admitting: Student

## 2019-02-28 DIAGNOSIS — S299XXA Unspecified injury of thorax, initial encounter: Secondary | ICD-10-CM | POA: Diagnosis not present

## 2019-02-28 DIAGNOSIS — S0990XA Unspecified injury of head, initial encounter: Secondary | ICD-10-CM | POA: Diagnosis not present

## 2019-02-28 DIAGNOSIS — M79642 Pain in left hand: Secondary | ICD-10-CM | POA: Diagnosis not present

## 2019-02-28 DIAGNOSIS — Y92009 Unspecified place in unspecified non-institutional (private) residence as the place of occurrence of the external cause: Secondary | ICD-10-CM | POA: Diagnosis not present

## 2019-02-28 DIAGNOSIS — R519 Headache, unspecified: Secondary | ICD-10-CM | POA: Insufficient documentation

## 2019-02-28 DIAGNOSIS — M546 Pain in thoracic spine: Secondary | ICD-10-CM | POA: Diagnosis not present

## 2019-02-28 DIAGNOSIS — W010XXA Fall on same level from slipping, tripping and stumbling without subsequent striking against object, initial encounter: Secondary | ICD-10-CM | POA: Diagnosis not present

## 2019-02-28 DIAGNOSIS — S6992XA Unspecified injury of left wrist, hand and finger(s), initial encounter: Secondary | ICD-10-CM | POA: Diagnosis not present

## 2019-02-28 DIAGNOSIS — S61412A Laceration without foreign body of left hand, initial encounter: Secondary | ICD-10-CM | POA: Diagnosis not present

## 2019-02-28 DIAGNOSIS — W19XXXA Unspecified fall, initial encounter: Secondary | ICD-10-CM

## 2019-02-28 DIAGNOSIS — R0781 Pleurodynia: Secondary | ICD-10-CM | POA: Diagnosis not present

## 2019-02-28 DIAGNOSIS — M5489 Other dorsalgia: Secondary | ICD-10-CM | POA: Diagnosis not present

## 2019-03-06 DIAGNOSIS — M546 Pain in thoracic spine: Secondary | ICD-10-CM | POA: Diagnosis not present

## 2019-03-06 DIAGNOSIS — M79642 Pain in left hand: Secondary | ICD-10-CM | POA: Diagnosis not present

## 2019-03-06 NOTE — Progress Notes (Signed)
Patient: Todd Olson Male    DOB: 1940-09-02   78 y.o.   MRN: [id number] Visit Date: 03/07/2019  Today's Provider: [name], MD   Chief Complaint  Patient presents with  . Follow-up  . Diabetes  . Hyperlipidemia  . Coronary Artery Disease   Subjective:     HPI   Type 2 diabetes mellitus without complication, without long-term current use of insulin ([place]) From 11/05/2018-Controlled. RTC 4 months. Hb A1C --7.0.  Home fasting blood sugars 140.  Arteriosclerosis of nonautologous coronary artery bypass graft From 11/05/2018-All risk factors treated  Pure hypercholesterolemia From 07/04/2018-switched lovastatin to rosuvastatin 20 mg qd.  Patient also had a fall 02/28/2019. Patient was seen at [facility]. Imaging was done on Head and left hand. Patient and his wife are upset that they have not been called with the results of the x-rays and I have no way of accessing them through the [facility] epic website.  He is still feeling very sore.   Allergies  Allergen Reactions  . Oxycodone Other (See Comments)    hallucination  . Penicillins Rash    Rash as a child.  Has not taken since then Has patient had a PCN reaction causing immediate rash, facial/tongue/throat swelling, SOB or lightheadedness with hypotension: Yes Has patient had a PCN reaction causing severe rash involving mucus membranes or skin necrosis: No Has patient had a PCN reaction that required hospitalization: No Has patient had a PCN reaction occurring within the last 10 years: No If all of the above answers are "NO", then may proceed with Cephalosporin use.      Current Outpatient Medications:  .  allopurinol (ZYLOPRIM) 100 MG tablet, Take 1 tablet (100 mg total) by mouth daily. Start on Feb 20,2020, Disp: 30 tablet, Rfl: 6 .  amLODipine (NORVASC) 5 MG tablet, Take 1 tablet (5 mg total) by mouth daily. (Patient taking differently: Take 5 mg by mouth daily. am), Disp: 30 tablet,  Rfl: 12 .  aspirin 81 MG tablet, Take 81 mg by mouth every other day. At night, Disp: , Rfl:  .  Cholecalciferol 1000 UNITS capsule, Take 1,000 Units by mouth daily. am, Disp: , Rfl:  .  clopidogrel (PLAVIX) 75 MG tablet, Take 1 tablet by mouth once daily, Disp: 30 tablet, Rfl: 11 .  colchicine 0.6 MG tablet, Take 1 tablet (0.6 mg total) by mouth daily. (Patient taking differently: Take 0.6 mg by mouth daily. am), Disp: 30 tablet, Rfl: 5 .  furosemide (LASIX) 40 MG tablet, Take 1 tablet (40 mg total) by mouth daily. am, Disp: 90 tablet, Rfl: 3 .  Glucosamine-Chondroit-Vit C-Mn (GLUCOSAMINE CHONDR 1500 COMPLX) CAPS, Take 1 capsule by mouth 2 (two) times daily. , Disp: , Rfl:  .  lisinopril-hydrochlorothiazide (ZESTORETIC) 20-12.5 MG tablet, Take 1 tablet by mouth once daily, Disp: 90 tablet, Rfl: 0 .  magnesium oxide (MAG-OX) 400 (241.3 MG) MG tablet, Take 400 mg by mouth 2 (two) times daily. Am and pm, Disp: , Rfl:  .  metFORMIN (GLUCOPHAGE) 1000 MG tablet, TAKE 1 TABLET BY MOUTH TWICE DAILY WITH MEALS (Patient taking differently: Take 1,000 mg by mouth 2 (two) times daily with a meal. Am and pm), Disp: 180 tablet, Rfl: 4 .  metoprolol tartrate (LOPRESSOR) 25 MG tablet, Take 1 tablet by mouth twice daily (Patient taking differently: Take 25 mg by mouth 2 (two) times daily. Am and pm), Disp: 180 tablet, Rfl: 3 .  mupirocin ointment (  BACTROBAN) 2 %, Apply topically., Disp: , Rfl:  .  Omega-3 Fatty Acids (FISH OIL) 1000 MG CAPS, Take 525 mg by mouth daily. , Disp: , Rfl:  .  ONETOUCH ULTRA test strip, USE TO CHECK BLOOD SUGAR TWICE DAILY, Disp: 100 each, Rfl: 5 .  pioglitazone (ACTOS) 45 MG tablet, Take 1 tablet (45 mg total) by mouth daily. Take one tablet daily/am, Disp: 90 tablet, Rfl: 3 .  rosuvastatin (CRESTOR) 10 MG tablet, Take 1 tablet (10 mg total) by mouth daily. (Patient taking differently: Take 10 mg by mouth daily. bedtime), Disp: 90 tablet, Rfl: 3 .  tiZANidine (ZANAFLEX) 2 MG tablet, Take  2 mg by mouth 3 (three) times daily as needed., Disp: , Rfl:  .  colchicine 0.6 MG tablet, Take 1 tablet (0.6 mg total) by mouth 2 (two) times daily. (Patient not taking: Reported on 11/12/2018), Disp: 60 tablet, Rfl: 11 .  ferrous sulfate 325 (65 FE) MG tablet, Take 325 mg by mouth daily., Disp: , Rfl:  .  lovastatin (MEVACOR) 40 MG tablet, TAKE 1 TABLET BY MOUTH AT BEDTIME (Patient not taking: No sig reported), Disp: 90 tablet, Rfl: 3 .  ondansetron (ZOFRAN) 4 MG tablet, Take 1 tablet (4 mg total) by mouth every 8 (eight) hours as needed for nausea or vomiting. (Patient not taking: Reported on 11/08/2018), Disp: 30 tablet, Rfl: 0  Review of Systems  Constitutional: Negative for appetite change, chills and fever.  Eyes: Negative.   Respiratory: Negative for chest tightness, shortness of breath and wheezing.   Cardiovascular: Negative for chest pain and palpitations.  Gastrointestinal: Negative for abdominal pain, nausea and vomiting.  Endocrine: Negative.   Musculoskeletal: Negative.   Allergic/Immunologic: Negative.   Neurological: Negative.   Hematological: Negative.   Psychiatric/Behavioral: Negative.     Social History   Tobacco Use  . Smoking status: Former Smoker    Packs/day: 2.00    Years: 22.00    Pack years: 44.00    Types: Cigarettes    Quit date: 11/17/1977    Years since quitting: 41.3  . Smokeless tobacco: Never Used  Substance Use Topics  . Alcohol use: No    Alcohol/week: 0.0 standard drinks      Objective:   BP (!) 147/75 (BP Location: Right Arm, Patient Position: Sitting, Cuff Size: Large)   Pulse 72   Temp (!) 97.3 F (36.3 C) (Other (Comment))   Resp 18   Ht 5\' 9"  (1.753 m)   Wt 204 lb (92.5 kg)   SpO2 97%   BMI 30.13 kg/m  Vitals:   03/07/19 0935  BP: (!) 147/75  Pulse: 72  Resp: 18  Temp: (!) 97.3 F (36.3 C)  TempSrc: Other (Comment)  SpO2: 97%  Weight: 204 lb (92.5 kg)  Height: 5\' 9"  (1.753 m)  Body mass index is 30.13 kg/m.    Physical Exam Vitals signs reviewed.  Constitutional:      Appearance: Normal appearance. He is well-developed and normal weight.  HENT:     Head: Normocephalic and atraumatic.     Right Ear: Tympanic membrane, ear canal and external ear normal.     Left Ear: Tympanic membrane, ear canal and external ear normal.     Nose: Nose normal.     Mouth/Throat:     Mouth: Mucous membranes are moist.     Pharynx: Oropharynx is clear.  Eyes:     General: No scleral icterus.    Extraocular Movements: Extraocular movements intact.  Conjunctiva/sclera: Conjunctivae normal.     Pupils: Pupils are equal, round, and reactive to light.  Neck:     Musculoskeletal: Normal range of motion and neck supple.     Thyroid: No thyromegaly.     Vascular: No carotid bruit.  Cardiovascular:     Rate and Rhythm: Normal rate and regular rhythm.     Pulses: Normal pulses.     Heart sounds: Normal heart sounds.  Pulmonary:     Effort: Pulmonary effort is normal.     Breath sounds: Normal breath sounds.  Abdominal:     General: Abdomen is flat. Bowel sounds are normal.     Palpations: Abdomen is soft.  Genitourinary:    Penis: Normal.      Scrotum/Testes: Normal.     Prostate: Normal.     Rectum: Normal.  Lymphadenopathy:     Cervical: No cervical adenopathy.  Skin:    General: Skin is warm and dry.     Comments: AKs on top of head.  Neurological:     General: No focal deficit present.     Mental Status: He is alert and oriented to person, place, and time. Mental status is at baseline.  Psychiatric:        Mood and Affect: Mood normal.        Behavior: Behavior normal.        Thought Content: Thought content normal.        Judgment: Judgment normal.      Results for orders placed or performed in visit on 03/07/19  POCT HgB A1C  Result Value Ref Range   Hemoglobin A1C 6.8 (A) 4.0 - 5.6 %   Est. average glucose Bld gHb Est-mCnc 148        Assessment & Plan    1. Type 2 diabetes mellitus  without complication, without long-term current use of insulin ([facility])  - POCT HgB A1C 6.8 today.  2.Fall Use heating pad for bruising and pain.  This is slowly improving. Follow up in March as scheduled.   3. Arteriosclerosis of nonautologous coronary artery bypass graft   4. Pure hypercholesterolemia Check lipids on next visit as these have not been checked since increasing the strength of a statin in the spring.     Mckensie Scotti Cranford Mon, MD  [facility]

## 2019-03-07 ENCOUNTER — Telehealth: Payer: Self-pay

## 2019-03-07 ENCOUNTER — Other Ambulatory Visit: Payer: Self-pay

## 2019-03-07 ENCOUNTER — Encounter: Payer: Self-pay | Admitting: Family Medicine

## 2019-03-07 ENCOUNTER — Ambulatory Visit (INDEPENDENT_AMBULATORY_CARE_PROVIDER_SITE_OTHER): Payer: Medicare Other | Admitting: Family Medicine

## 2019-03-07 VITALS — BP 147/75 | HR 72 | Temp 97.3°F | Resp 18 | Ht 69.0 in | Wt 204.0 lb

## 2019-03-07 DIAGNOSIS — I25810 Atherosclerosis of coronary artery bypass graft(s) without angina pectoris: Secondary | ICD-10-CM

## 2019-03-07 DIAGNOSIS — W19XXXA Unspecified fall, initial encounter: Secondary | ICD-10-CM | POA: Diagnosis not present

## 2019-03-07 DIAGNOSIS — E119 Type 2 diabetes mellitus without complications: Secondary | ICD-10-CM

## 2019-03-07 DIAGNOSIS — E7800 Pure hypercholesterolemia, unspecified: Secondary | ICD-10-CM

## 2019-03-07 LAB — POCT GLYCOSYLATED HEMOGLOBIN (HGB A1C)
Est. average glucose Bld gHb Est-mCnc: 148
Hemoglobin A1C: 6.8 % — AB (ref 4.0–5.6)

## 2019-03-07 NOTE — Telephone Encounter (Signed)
Please advise 

## 2019-03-07 NOTE — Telephone Encounter (Signed)
Pt stated he went to [facility] on 02/28/2019 and they did Xrays. Pt stated they finally called him today with the results of Xrays and ribs 9 and 10 are cracked. Pt stated he wanted to let Dr. Rosanna Randy know since he wasn't able to see the Xrays and would like call back to if he should do anything differently since he does have 2 cracked ribs. Please advise. Thanks TNP

## 2019-03-07 NOTE — Patient Instructions (Addendum)
Fall Use heating pad for bruising and pain.   Follow up in March as scheduled.

## 2019-03-11 ENCOUNTER — Other Ambulatory Visit: Payer: Self-pay | Admitting: Physician Assistant

## 2019-04-11 DIAGNOSIS — H905 Unspecified sensorineural hearing loss: Secondary | ICD-10-CM | POA: Diagnosis not present

## 2019-05-07 ENCOUNTER — Telehealth: Payer: Self-pay

## 2019-05-07 NOTE — Telephone Encounter (Signed)
Please advise 

## 2019-05-07 NOTE — Telephone Encounter (Signed)
Copied from [place] [id number]. Topic: General - Inquiry >> May 07, 2019  4:20 PM Reyne Dumas L wrote: Reason for CRM:  Pt states that the health department is giving out the COVID vaccinations tomorrow.  States that it was on the news.  Pt wants to know if it is safe to take the shot. Pt can be reached at [phone]

## 2019-05-08 NOTE — Telephone Encounter (Signed)
yes

## 2019-05-08 NOTE — Telephone Encounter (Signed)
Advised 

## 2019-06-17 DIAGNOSIS — I2119 ST elevation (STEMI) myocardial infarction involving other coronary artery of inferior wall: Secondary | ICD-10-CM | POA: Diagnosis not present

## 2019-06-17 DIAGNOSIS — I25810 Atherosclerosis of coronary artery bypass graft(s) without angina pectoris: Secondary | ICD-10-CM | POA: Diagnosis not present

## 2019-06-17 DIAGNOSIS — I493 Ventricular premature depolarization: Secondary | ICD-10-CM | POA: Diagnosis not present

## 2019-06-17 DIAGNOSIS — I2510 Atherosclerotic heart disease of native coronary artery without angina pectoris: Secondary | ICD-10-CM | POA: Diagnosis not present

## 2019-06-17 DIAGNOSIS — I10 Essential (primary) hypertension: Secondary | ICD-10-CM | POA: Diagnosis not present

## 2019-07-04 ENCOUNTER — Other Ambulatory Visit: Payer: Self-pay | Admitting: Family Medicine

## 2019-07-04 DIAGNOSIS — E7800 Pure hypercholesterolemia, unspecified: Secondary | ICD-10-CM

## 2019-07-09 ENCOUNTER — Other Ambulatory Visit: Payer: Self-pay | Admitting: Family Medicine

## 2019-07-09 NOTE — Progress Notes (Addendum)
Patient: Todd Olson, Todd Olson    DOB: [birth year], 79 y.o.   MRN: [id number] Visit Date: 07/10/2019  Today's Provider: [name], MD   Chief Complaint  Patient presents with  . Annual Exam   Subjective:     Patient had AWV with NHA today at 9:40 am.   Complete Physical Todd Olson is a 79 y.o. Todd Olson. He feels well. He reports he is exercising. He reports he is sleeping well.  He is married and is a father of 3 and the grandfather of 38.  He is a retired Hydrographic surveyor.  -----------------------------------------------------------  Colonoscopy: 11/12/2018  Review of Systems  Constitutional: Negative.   HENT: Positive for hearing loss.   Eyes: Negative.   Respiratory: Negative.   Cardiovascular: Negative.   Gastrointestinal: Negative.   Endocrine: Positive for polydipsia.  Genitourinary: Negative.   Musculoskeletal: Negative.   Skin: Negative.   Allergic/Immunologic: Positive for environmental allergies.  Neurological: Negative.   Hematological: Negative.   Psychiatric/Behavioral: Negative.     Social History   Socioeconomic History  . Marital status: Married    Spouse name: Not on file  . Number of children: 3  . Years of education: Not on file  . Highest education level: Associate degree: occupational, Hotel manager, or vocational program  Occupational History  . Occupation: retired  Tobacco Use  . Smoking status: Former Smoker    Packs/day: 2.00    Years: 22.00    Pack years: 44.00    Types: Cigarettes    Quit date: 11/17/1977    Years since quitting: 41.6  . Smokeless tobacco: Never Used  Substance and Sexual Activity  . Alcohol use: No    Alcohol/week: 0.0 standard drinks  . Drug use: No  . Sexual activity: Not Currently  Other Topics Concern  . Not on file  Social History Narrative  . Not on file   Social Determinants of Health   Financial Resource Strain: Low Risk   . Difficulty of Paying Living Expenses: Not hard at all  Food  Insecurity: No Food Insecurity  . Worried About Charity fundraiser in the Last Year: Never true  . Ran Out of Food in the Last Year: Never true  Transportation Needs: No Transportation Needs  . Lack of Transportation (Medical): No  . Lack of Transportation (Non-Medical): No  Physical Activity: Inactive  . Days of Exercise per Week: 0 days  . Minutes of Exercise per Session: 0 min  Stress: No Stress Concern Present  . Feeling of Stress : Not at all  Social Connections: Slightly Isolated  . Frequency of Communication with Friends and Family: More than three times a week  . Frequency of Social Gatherings with Friends and Family: Twice a week  . Attends Religious Services: More than 4 times per year  . Active Member of Clubs or Organizations: No  . Attends Archivist Meetings: Never  . Marital Status: Married  Human resources officer Violence: Not At Risk  . Fear of Current or Ex-Partner: No  . Emotionally Abused: No  . Physically Abused: No  . Sexually Abused: No    Past Medical History:  Diagnosis Date  . Anemia   . CHF (congestive heart failure) ([place])   . Coronary artery disease   . Diabetes mellitus without complication ([place])    type 2  . Full dentures    wears upper  . GI bleeding    upper and lower  . Heart  attack ([place]) 04/05/2002  . History of hiatal hernia   . Hypertension    controlled on meds  . Myocardial infarction ([place])    X 4 83', 84',86' and LAST 2003     Patient Active Problem List   Diagnosis Date Noted  . Encounter for screening colonoscopy   . DM (diabetes mellitus) with complications ([place]) [id number]  . Arteriosclerosis of nonautologous coronary artery bypass graft 09/10/2014  . Back pain, chronic 09/10/2014  . Diabetes mellitus, type 2 ([place]) 09/10/2014  . Well controlled diabetes mellitus ([place]) 09/10/2014  . Hemorrhoid 09/10/2014  . HLD (hyperlipidemia) 09/10/2014  . BP (high blood pressure) 09/10/2014  . Fatty tumor 09/10/2014  . Absolute  anemia 09/10/2014  . Muscle ache 09/10/2014  . Arthritis of hand, degenerative 09/10/2014  . Benign neoplasm of colon 09/10/2014  . Rotator cuff syndrome 09/10/2014  . Head revolving around 09/10/2014  . Adynamia 09/10/2014    Past Surgical History:  Procedure Laterality Date  . BACK SURGERY  2016   laminectomy lumbar  . CATARACT EXTRACTION W/PHACO Left 12/06/2016   Procedure: CATARACT EXTRACTION PHACO AND INTRAOCULAR LENS PLACEMENT (IOC);  Surgeon: Birder Robson, MD;  Location: [facility];  Service: Ophthalmology;  Laterality: Left;  [place] 00:31 AP% 15.6 CDE 4.99 Fluid pack lot # [id number]  . CATARACT EXTRACTION W/PHACO Right 12/27/2016   Procedure: CATARACT EXTRACTION PHACO AND INTRAOCULAR LENS PLACEMENT (IOC);  Surgeon: Birder Robson, MD;  Location: [facility];  Service: Ophthalmology;  Laterality: Right;  [place] 00:46 AP% 12.5 CDE 5.79 Fluid pack lot # [id number]  . CHOLECYSTECTOMY  2013   Dr. Rochel Brome  . COLONOSCOPY WITH PROPOFOL N/A 11/12/2018   Procedure: COLONOSCOPY WITH PROPOFOL;  Surgeon: Lucilla Lame, MD;  Location: [facility];  Service: Endoscopy;  Laterality: N/A;  diabetic-oral meds  . colonosopy  2010, 2013, 2014  . CORONARY ANGIOPLASTY     STENT PLACEMENT X1  . EYE SURGERY     bilateral  . FOOT SURGERY  1996  . HEMORROIDECTOMY  2014  . RECTAL SURGERY     repair of bleed after hemrrhoid surgery  . SHOULDER ARTHROSCOPY WITH OPEN ROTATOR CUFF REPAIR AND DISTAL CLAVICLE ACROMINECTOMY Right 08/15/2017   Procedure: right shoulder arthroscopy, arthroscopic subacromial decompression, distal clavicle excision, biceps tenotomy, open rotator cuff repair;  Surgeon: Thornton Park, MD;  Location: [facility];  Service: Orthopedics;  Laterality: Right;  . SHOULDER SURGERY Left 2015    His family history includes Bipolar disorder in his sister; Diabetes in his brother and brother; Heart disease in his brother, brother, father, and mother; Hypertension in his brother and  father.   Current Outpatient Medications:  .  allopurinol (ZYLOPRIM) 100 MG tablet, Take 1 tablet (100 mg total) by mouth daily. Start on Feb 20,2020, Disp: 30 tablet, Rfl: 6 .  amLODipine (NORVASC) 5 MG tablet, Take 1 tablet (5 mg total) by mouth daily. (Patient taking differently: Take 5 mg by mouth daily. am), Disp: 30 tablet, Rfl: 12 .  aspirin 81 MG tablet, Take 81 mg by mouth every other day. At night, Disp: , Rfl:  .  Cholecalciferol 1000 UNITS capsule, Take 1,000 Units by mouth daily. am, Disp: , Rfl:  .  clopidogrel (PLAVIX) 75 MG tablet, Take 1 tablet by mouth once daily, Disp: 30 tablet, Rfl: 11 .  colchicine 0.6 MG tablet, Take 1 tablet (0.6 mg total) by mouth 2 (two) times daily. (Patient not taking: Reported on 07/10/2019), Disp: 60 tablet, Rfl: 11 .  colchicine 0.6  MG tablet, Take 1 tablet (0.6 mg total) by mouth daily. (Patient taking differently: Take 0.6 mg by mouth daily. am), Disp: 30 tablet, Rfl: 5 .  ferrous sulfate 325 (65 FE) MG tablet, Take 325 mg by mouth daily., Disp: , Rfl:  .  furosemide (LASIX) 40 MG tablet, Take 1 tablet (40 mg total) by mouth daily. am, Disp: 90 tablet, Rfl: 3 .  Glucosamine-Chondroit-Vit C-Mn (GLUCOSAMINE CHONDR 1500 COMPLX) CAPS, Take 1 capsule by mouth 2 (two) times daily. , Disp: , Rfl:  .  lisinopril-hydrochlorothiazide (ZESTORETIC) 20-12.5 MG tablet, Take 1 tablet by mouth once daily, Disp: 90 tablet, Rfl: 3 .  lovastatin (MEVACOR) 40 MG tablet, TAKE 1 TABLET BY MOUTH AT BEDTIME (Patient not taking: No sig reported), Disp: 90 tablet, Rfl: 3 .  magnesium oxide (MAG-OX) 400 (241.3 MG) MG tablet, Take 400 mg by mouth 2 (two) times daily. Am and pm, Disp: , Rfl:  .  metFORMIN (GLUCOPHAGE) 1000 MG tablet, TAKE 1 TABLET BY MOUTH TWICE DAILY WITH MEALS (Patient taking differently: Take 1,000 mg by mouth 2 (two) times daily with a meal. Am and pm), Disp: 180 tablet, Rfl: 4 .  metoprolol tartrate (LOPRESSOR) 25 MG tablet, Take 1 tablet by mouth twice  daily (Patient taking differently: Take 25 mg by mouth 2 (two) times daily. Am and pm), Disp: 180 tablet, Rfl: 3 .  Omega-3 Fatty Acids (FISH OIL) 1000 MG CAPS, Take 525 mg by mouth daily. , Disp: , Rfl:  .  ondansetron (ZOFRAN) 4 MG tablet, Take 1 tablet (4 mg total) by mouth every 8 (eight) hours as needed for nausea or vomiting. (Patient not taking: Reported on 11/08/2018), Disp: 30 tablet, Rfl: 0 .  ONETOUCH ULTRA test strip, USE TO CHECK BLOOD SUGAR TWICE DAILY, Disp: 100 each, Rfl: 5 .  pioglitazone (ACTOS) 45 MG tablet, Take 1 tablet (45 mg total) by mouth daily. Take one tablet daily/am, Disp: 90 tablet, Rfl: 3 .  rosuvastatin (CRESTOR) 10 MG tablet, Take 1 tablet by mouth once daily, Disp: 90 tablet, Rfl: 0 .  tiZANidine (ZANAFLEX) 2 MG tablet, Take 2 mg by mouth 3 (three) times daily as needed., Disp: , Rfl:   Patient Care Team: Jerrol Banana., MD as PCP - General (Family Medicine) Isaias Cowman, MD as Consulting Physician (Cardiology) Birder Robson, MD as Referring Physician (Ophthalmology) Throat, [place] Ear Nose And     Objective:    Vitals: There were no vitals taken for this visit.  Physical Exam Vitals reviewed.  Constitutional:      Appearance: Normal appearance. He is normal weight.  HENT:     Head: Normocephalic and atraumatic.     Right Ear: Tympanic membrane, ear canal and external ear normal.     Left Ear: Tympanic membrane, ear canal and external ear normal.     Nose: Nose normal.     Mouth/Throat:     Mouth: Mucous membranes are moist.     Pharynx: Oropharynx is clear.  Eyes:     Extraocular Movements: Extraocular movements intact.     Conjunctiva/sclera: Conjunctivae normal.     Pupils: Pupils are equal, round, and reactive to light.  Cardiovascular:     Rate and Rhythm: Normal rate and regular rhythm.     Pulses: Normal pulses.     Heart sounds: Normal heart sounds.  Pulmonary:     Effort: Pulmonary effort is normal.     Breath  sounds: Normal breath sounds.  Abdominal:  General: Abdomen is flat. Bowel sounds are normal.     Palpations: Abdomen is soft.  Musculoskeletal:        General: Normal range of motion.     Cervical back: Normal range of motion and neck supple.     Comments: Trace pedal edema  Skin:    General: Skin is warm and dry.     Comments: AKs on top of head.  Neurological:     General: No focal deficit present.     Mental Status: He is alert and oriented to person, place, and time. Mental status is at baseline.  Psychiatric:        Mood and Affect: Mood normal.        Behavior: Behavior normal.        Thought Content: Thought content normal.        Judgment: Judgment normal.     Activities of Daily Living In your present state of health, do you have any difficulty performing the following activities: 07/10/2019 11/12/2018  Hearing? Y N  Comment Wears bilateral hearing aids. -  Vision? Y N  Comment Has a film over both eyes occasionally. Has an appt with Dr George Ina next week. -  Difficulty concentrating or making decisions? N N  Walking or climbing stairs? Y N  Comment Due to knee pains. -  Dressing or bathing? N N  Doing errands, shopping? N -  Preparing Food and eating ? N -  Using the Toilet? N -  In the past six months, have you accidently leaked urine? N -  Do you have problems with loss of bowel control? N -  Managing your Medications? N -  Managing your Finances? N -  Housekeeping or managing your Housekeeping? N -  Some recent data might be hidden    Fall Risk Assessment Fall Risk  07/10/2019 07/04/2018 06/07/2017 05/31/2016 05/28/2015  Falls in the past year? 1 0 No No No  Number falls in past yr: 0 - - - -  Injury with Fall? 1 - - - -  Follow up Falls prevention discussed - - - -     Depression Screen PHQ 2/9 Scores 07/10/2019 07/04/2018 07/04/2018 06/07/2017  PHQ - 2 Score 0 1 1 0  PHQ- 9 Score - 1 - 0    6CIT Screen 07/04/2018  What Year? 0 points  What month? 0 points    What time? 0 points  Count back from 20 0 points  Months in reverse 0 points  Repeat phrase 0 points  Total Score 0       Assessment & Plan:    Annual Physical Reviewed patient's Family Medical History Reviewed and updated list of patient's medical providers Assessment of cognitive impairment was done Assessed patient's functional ability Established a written schedule for health screening [place] Completed and Reviewed  Exercise Activities and Dietary recommendations Goals    . Exercise 3x per week (30 min per time)     Recommend to start exercising for 3 days a week for at least 30 minutes.        Immunization History  Administered Date(s) Administered  . Fluad Quad(high Dose 65+) 01/23/2019  . Influenza, High Dose Seasonal PF 01/26/2015, 02/01/2016, 02/06/2017, 01/03/2018  . PFIZER SARS-COV-2 Vaccination 05/08/2019, 06/01/2019  . Pneumococcal Conjugate-13 11/14/2013  . Pneumococcal Polysaccharide-23 10/17/2011, 07/08/2014  . Td 05/31/2016    Health Maintenance  Topic Date Due  . FOOT EXAM  05/31/2017  . OPHTHALMOLOGY EXAM  07/13/2019  .  HEMOGLOBIN A1C  09/04/2019  . TETANUS/TDAP  05/31/2026  . INFLUENZA VACCINE  Completed  . PNA vac Low Risk Adult  Completed     Discussed health benefits of physical activity, and encouraged him to engage in regular exercise appropriate for his age and condition.   1. Annual physical exam   2. DM (diabetes mellitus) with complications ([facility]) 123456 today is 6.8.  Good control.  No change in meds. - POCT HgB A1C - CBC with Differential/Platelet - Comprehensive metabolic panel - Lipid panel - TSH  3. Pain of left hand May need referral to hand surgeon.  Repeat x-ray to make sure there is no fracture.  I think this is a traumatic tendinitis/synovitis. - DG Hand Complete Left  4. Essential hypertension  - CBC with Differential/Platelet - Comprehensive metabolic panel - Lipid panel - TSH  5.  Pure hypercholesterolemia  - CBC with Differential/Platelet - Comprehensive metabolic panel - Lipid panel - TSH  6. Anemia, unspecified type Work-up if indicated. - CBC with Differential/Platelet - Comprehensive metabolic panel - Lipid panel - TSH  7. Arteriosclerosis of nonautologous coronary artery bypass graft All risk factors treated. - CBC with Differential/Platelet - Comprehensive metabolic panel - Lipid panel - TSH 8.  Gout Refill colchicine and allopurinol. ------------------------------------------------------------------------------------------------------------    [name], MD  [facility]

## 2019-07-09 NOTE — Telephone Encounter (Signed)
Requested medications are due for refill today?  Yes  Requested medications are on active medication list?  Yes  Last Refill:   There are two different prescriptions for this medication on patient's active medication list.   One which is prescribed twice  a day was last filled  06/13/2018 # 60 with 11 refills.   The other RX is prescribed once a day  filled on 07/05/2018 # 30 with 5 refills.    Future visit scheduled? Patient is scheduled to see MD on 07/10/2019.    Notes to Clinic:  RX refill protocol failed,  in addition to the above,  due to lab work needing to be checked.

## 2019-07-09 NOTE — Progress Notes (Signed)
Subjective:   CORDERRO NISSLEY is a 79 y.o. male who presents for Medicare Annual/Subsequent preventive examination.  Review of Systems:  N/A  Cardiac Risk Factors include: diabetes mellitus;dyslipidemia;male gender;hypertension     Objective:    Vitals: BP 126/60 (BP Location: Right Arm)   Pulse 67   Temp (!) 97.5 F (36.4 C) (Temporal)   Ht 5\' 9"  (1.753 m)   Wt 204 lb (92.5 kg)   BMI 30.13 kg/m   Body mass index is 30.13 kg/m.  Advanced Directives 07/10/2019 11/12/2018 07/04/2018 08/11/2017 06/07/2017 12/27/2016 12/06/2016  Does Patient Have a Medical Advance Directive? Yes Yes Yes Yes Yes Yes Yes  Type of Paramedic of [place];Living will [place];Living will [place];Living will [place];Living will [place];Living will [place];Living will [place];Living will  Does patient want to make changes to medical advance directive? - No - Patient declined - No - Patient declined - No - Patient declined No - Patient declined  Copy of [place] in Chart? Yes - validated most recent copy scanned in chart (See row information) Yes - validated most recent copy scanned in chart (See row information) Yes - validated most recent copy scanned in chart (See row information) Yes Yes Yes Yes    Tobacco Social History   Tobacco Use  Smoking Status Former Smoker  . Packs/day: 2.00  . Years: 22.00  . Pack years: 44.00  . Types: Cigarettes  . Quit date: 11/17/1977  . Years since quitting: 41.6  Smokeless Tobacco Never Used     Counseling given: Not Answered   Clinical Intake:  Pre-visit preparation completed: Yes  Pain : 0-10 Pain Score: 1  Pain Type: Chronic pain Pain Location: Hand Pain Orientation: Left Pain Descriptors / Indicators: Aching Pain Frequency: Constant     Nutritional Status: BMI > 30  Obese Nutritional Risks:  None Diabetes: Yes  How often do you need to have someone help you when you read instructions, pamphlets, or other written materials from your doctor or pharmacy?: 1 - Never   Diabetes:  Is the patient diabetic?  Yes  If diabetic, was a CBG obtained today?  No  Did the patient bring in their glucometer from home?  No  How often do you monitor your CBG's? Once a day in the morning, occasionally in the afternoon too.   Financial Strains and Diabetes Management:  Are you having any financial strains with the device, your supplies or your medication? No .  Does the patient want to be seen by Chronic Care Management for management of their diabetes?  No  Would the patient like to be referred to a Nutritionist or for Diabetic Management?  No   Diabetic Exams:  Diabetic Eye Exam: Completed 07/13/18. Repeat yearly.  Diabetic Foot Exam: Completed 05/31/16. Pt has been advised about the importance in completing this exam. Note made to follow up on this at today's OV.   Interpreter Needed?: No  Information entered by :: [facility], LPN  Past Medical History:  Diagnosis Date  . Anemia   . CHF (congestive heart failure) ([place])   . Coronary artery disease   . Diabetes mellitus without complication ([place])    type 2  . Full dentures    wears upper  . GI bleeding    upper and lower  . Heart attack ([place]) 04/05/2002  . History of hiatal hernia   .  Hypertension    controlled on meds  . Myocardial infarction ([place])    X 4 83', 84',86' and LAST 2003   Past Surgical History:  Procedure Laterality Date  . BACK SURGERY  2016   laminectomy lumbar  . CATARACT EXTRACTION W/PHACO Left 12/06/2016   Procedure: CATARACT EXTRACTION PHACO AND INTRAOCULAR LENS PLACEMENT (IOC);  Surgeon: Birder Robson, MD;  Location: [facility];  Service: Ophthalmology;  Laterality: Left;  [place] 00:31 AP% 15.6 CDE 4.99 Fluid pack lot # [id number]  . CATARACT EXTRACTION W/PHACO Right 12/27/2016   Procedure: CATARACT  EXTRACTION PHACO AND INTRAOCULAR LENS PLACEMENT (IOC);  Surgeon: Birder Robson, MD;  Location: [facility];  Service: Ophthalmology;  Laterality: Right;  [place] 00:46 AP% 12.5 CDE 5.79 Fluid pack lot # [id number]  . CHOLECYSTECTOMY  2013   Dr. Rochel Brome  . COLONOSCOPY WITH PROPOFOL N/A 11/12/2018   Procedure: COLONOSCOPY WITH PROPOFOL;  Surgeon: Lucilla Lame, MD;  Location: [facility];  Service: Endoscopy;  Laterality: N/A;  diabetic-oral meds  . colonosopy  2010, 2013, 2014  . CORONARY ANGIOPLASTY     STENT PLACEMENT X1  . EYE SURGERY     bilateral  . FOOT SURGERY  1996  . HEMORROIDECTOMY  2014  . RECTAL SURGERY     repair of bleed after hemrrhoid surgery  . SHOULDER ARTHROSCOPY WITH OPEN ROTATOR CUFF REPAIR AND DISTAL CLAVICLE ACROMINECTOMY Right 08/15/2017   Procedure: right shoulder arthroscopy, arthroscopic subacromial decompression, distal clavicle excision, biceps tenotomy, open rotator cuff repair;  Surgeon: Thornton Park, MD;  Location: [facility];  Service: Orthopedics;  Laterality: Right;  . SHOULDER SURGERY Left 2015   Family History  Problem Relation Age of Onset  . Heart disease Mother   . Heart disease Father   . Hypertension Father   . Bipolar disorder Sister        with hospitalizations  . Diabetes Brother   . Hypertension Brother   . Heart disease Brother   . Diabetes Brother   . Heart disease Brother        has stents   Social History   Socioeconomic History  . Marital status: Married    Spouse name: Not on file  . Number of children: 3  . Years of education: Not on file  . Highest education level: Associate degree: occupational, Hotel manager, or vocational program  Occupational History  . Occupation: retired  Tobacco Use  . Smoking status: Former Smoker    Packs/day: 2.00    Years: 22.00    Pack years: 44.00    Types: Cigarettes    Quit date: 11/17/1977    Years since quitting: 41.6  . Smokeless tobacco: Never Used  Substance and Sexual  Activity  . Alcohol use: No    Alcohol/week: 0.0 standard drinks  . Drug use: No  . Sexual activity: Not Currently  Other Topics Concern  . Not on file  Social History Narrative  . Not on file   Social Determinants of Health   Financial Resource Strain: Low Risk   . Difficulty of Paying Living Expenses: Not hard at all  Food Insecurity: No Food Insecurity  . Worried About Charity fundraiser in the Last Year: Never true  . Ran Out of Food in the Last Year: Never true  Transportation Needs: No Transportation Needs  . Lack of Transportation (Medical): No  . Lack of Transportation (Non-Medical): No  Physical Activity: Inactive  . Days of Exercise per Week: 0 days  . Minutes of  Exercise per Session: 0 min  Stress: No Stress Concern Present  . Feeling of Stress : Not at all  Social Connections: Slightly Isolated  . Frequency of Communication with Friends and Family: More than three times a week  . Frequency of Social Gatherings with Friends and Family: Twice a week  . Attends Religious Services: More than 4 times per year  . Active Member of Clubs or Organizations: No  . Attends Archivist Meetings: Never  . Marital Status: Married    Outpatient Encounter Medications as of 07/10/2019  Medication Sig  . allopurinol (ZYLOPRIM) 100 MG tablet Take 1 tablet (100 mg total) by mouth daily. Start on Feb 20,2020  . amLODipine (NORVASC) 5 MG tablet Take 1 tablet (5 mg total) by mouth daily. (Patient taking differently: Take 5 mg by mouth daily. am)  . aspirin 81 MG tablet Take 81 mg by mouth every other day. At night  . Cholecalciferol 1000 UNITS capsule Take 1,000 Units by mouth daily. am  . clopidogrel (PLAVIX) 75 MG tablet Take 1 tablet by mouth once daily  . colchicine 0.6 MG tablet Take 1 tablet (0.6 mg total) by mouth daily. (Patient taking differently: Take 0.6 mg by mouth daily. am)  . furosemide (LASIX) 40 MG tablet Take 1 tablet (40 mg total) by mouth daily. am  .  Glucosamine-Chondroit-Vit C-Mn (GLUCOSAMINE CHONDR 1500 COMPLX) CAPS Take 1 capsule by mouth 2 (two) times daily.   Marland Kitchen lisinopril-hydrochlorothiazide (ZESTORETIC) 20-12.5 MG tablet Take 1 tablet by mouth once daily  . magnesium oxide (MAG-OX) 400 (241.3 MG) MG tablet Take 400 mg by mouth 2 (two) times daily. Am and pm  . metFORMIN (GLUCOPHAGE) 1000 MG tablet TAKE 1 TABLET BY MOUTH TWICE DAILY WITH MEALS (Patient taking differently: Take 1,000 mg by mouth 2 (two) times daily with a meal. Am and pm)  . metoprolol tartrate (LOPRESSOR) 25 MG tablet Take 1 tablet by mouth twice daily (Patient taking differently: Take 25 mg by mouth 2 (two) times daily. Am and pm)  . Omega-3 Fatty Acids (FISH OIL) 1000 MG CAPS Take 525 mg by mouth daily.   Glory Rosebush ULTRA test strip USE TO CHECK BLOOD SUGAR TWICE DAILY  . pioglitazone (ACTOS) 45 MG tablet Take 1 tablet (45 mg total) by mouth daily. Take one tablet daily/am  . rosuvastatin (CRESTOR) 10 MG tablet Take 1 tablet by mouth once daily  . colchicine 0.6 MG tablet Take 1 tablet (0.6 mg total) by mouth 2 (two) times daily. (Patient not taking: Reported on 07/10/2019)  . ferrous sulfate 325 (65 FE) MG tablet Take 325 mg by mouth daily.  Marland Kitchen lovastatin (MEVACOR) 40 MG tablet TAKE 1 TABLET BY MOUTH AT BEDTIME (Patient not taking: No sig reported)  . ondansetron (ZOFRAN) 4 MG tablet Take 1 tablet (4 mg total) by mouth every 8 (eight) hours as needed for nausea or vomiting. (Patient not taking: Reported on 11/08/2018)  . tiZANidine (ZANAFLEX) 2 MG tablet Take 2 mg by mouth 3 (three) times daily as needed.   No facility-administered encounter medications on file as of 07/10/2019.    Activities of Daily Living In your present state of health, do you have any difficulty performing the following activities: 07/10/2019 11/12/2018  Hearing? Y N  Comment Wears bilateral hearing aids. -  Vision? Y N  Comment Has a film over both eyes occasionally. Has an appt with Dr George Ina  next week. -  Difficulty concentrating or making decisions? N N  Walking  or climbing stairs? Y N  Comment Due to knee pains. -  Dressing or bathing? N N  Doing errands, shopping? N -  Preparing Food and eating ? N -  Using the Toilet? N -  In the past six months, have you accidently leaked urine? N -  Do you have problems with loss of bowel control? N -  Managing your Medications? N -  Managing your Finances? N -  Housekeeping or managing your Housekeeping? N -  Some recent data might be hidden    Patient Care Team: Jerrol Banana., MD as PCP - General (Family Medicine) Isaias Cowman, MD as Consulting Physician (Cardiology) Birder Robson, MD as Referring Physician (Ophthalmology) Throat, [place] [facility]   Assessment:   This is a routine wellness examination for Abdu.  Exercise Activities and Dietary recommendations Current Exercise Habits: The patient does not participate in regular exercise at present, Exercise limited by: orthopedic condition(s)  Goals    . Exercise 3x per week (30 min per time)     Recommend to start exercising for 3 days a week for at least 30 minutes.        Fall Risk Fall Risk  07/10/2019 07/04/2018 06/07/2017 05/31/2016 05/28/2015  Falls in the past year? 1 0 No No No  Number falls in past yr: 0 - - - -  Injury with Fall? 1 - - - -  Follow up Falls prevention discussed - - - -   FALL RISK PREVENTION PERTAINING TO THE HOME:  Any stairs in or around the home? Yes  If so, are there any without handrails? No   Home free of loose throw rugs in walkways, pet beds, electrical cords, etc? Yes  Adequate lighting in your home to reduce risk of falls? Yes   ASSISTIVE DEVICES UTILIZED TO PREVENT FALLS:  Life alert? No  Use of a cane, walker or w/c? No  Grab bars in the bathroom? Yes  Shower chair or bench in shower? No  Elevated toilet seat or a handicapped toilet? No    TIMED UP AND GO:  Was the test performed? No .     Depression Screen PHQ 2/9 Scores 07/10/2019 07/04/2018 07/04/2018 06/07/2017  PHQ - 2 Score 0 1 1 0  PHQ- 9 Score - 1 - 0    Cognitive Function: Declined today.     6CIT Screen 07/04/2018 06/07/2017 05/31/2016  What Year? 0 points 0 points 0 points  What month? 0 points 0 points 0 points  What time? 0 points 0 points 0 points  Count back from 20 0 points 0 points 0 points  Months in reverse 0 points 0 points 0 points  Repeat phrase 0 points 0 points 0 points  Total Score 0 0 0    Immunization History  Administered Date(s) Administered  . Fluad Quad(high Dose 65+) 01/23/2019  . Influenza, High Dose Seasonal PF 01/26/2015, 02/01/2016, 02/06/2017, 01/03/2018  . PFIZER SARS-COV-2 Vaccination 05/08/2019, 06/01/2019  . Pneumococcal Conjugate-13 11/14/2013  . Pneumococcal Polysaccharide-23 10/17/2011, 07/08/2014  . Td 05/31/2016    Qualifies for Shingles Vaccine? Yes . Due for Shingrix. Pt has been advised to call insurance company to determine out of pocket expense. Advised may also receive vaccine at local pharmacy or Health Dept. Verbalized acceptance and understanding.  Tdap: Up to date  Flu Vaccine: Up to date  Pneumococcal Vaccine: Completed series  Screening Tests Health Maintenance  Topic Date Due  . FOOT EXAM  05/31/2017  . OPHTHALMOLOGY  EXAM  07/13/2019  . HEMOGLOBIN A1C  09/04/2019  . TETANUS/TDAP  05/31/2026  . INFLUENZA VACCINE  Completed  . PNA vac Low Risk Adult  Completed   Cancer Screenings:  Colorectal Screening: No longer required.   Lung Cancer Screening: (Low Dose CT Chest recommended if Age 80-80 years, 30 pack-year currently smoking OR have quit w/in 15years.) does not qualify.   Additional Screening:  Dental Screening: Recommended annual dental exams for proper oral hygiene  Community Resource Referral:  CRR required this visit?  No        Plan:  I have personally reviewed and addressed the Medicare Annual Wellness questionnaire and have noted  the following in the patient's chart:  A. Medical and social history B. Use of alcohol, tobacco or illicit drugs  C. Current medications and supplements D. Functional ability and status E.  Nutritional status F.  Physical activity G. Advance directives H. List of other physicians I.  Hospitalizations, surgeries, and ER visits in previous 12 months J.  [place] such as hearing and vision if needed, cognitive and depression L. Referrals and appointments   In addition, I have reviewed and discussed with patient certain preventive protocols, quality metrics, and best practice recommendations. A written personalized care plan for preventive services as well as general preventive health recommendations were provided to patient.   [place]ora Score, [place]  [id number] Nurse Health Advisor  Nurse Notes: Pt needs a diabetic foot exam at today's OV.

## 2019-07-10 ENCOUNTER — Ambulatory Visit (INDEPENDENT_AMBULATORY_CARE_PROVIDER_SITE_OTHER): Payer: Medicare Other | Admitting: Family Medicine

## 2019-07-10 ENCOUNTER — Encounter: Payer: Self-pay | Admitting: Family Medicine

## 2019-07-10 ENCOUNTER — Other Ambulatory Visit: Payer: Self-pay

## 2019-07-10 ENCOUNTER — Ambulatory Visit (INDEPENDENT_AMBULATORY_CARE_PROVIDER_SITE_OTHER): Payer: Medicare Other

## 2019-07-10 ENCOUNTER — Ambulatory Visit
Admission: RE | Admit: 2019-07-10 | Discharge: 2019-07-10 | Disposition: A | Discharge status: Home / Self Care | Payer: Medicare Other | Source: Ambulatory Visit | Attending: Family Medicine | Admitting: Family Medicine

## 2019-07-10 VITALS — BP 126/60 | HR 67 | Temp 97.5°F | Ht 69.0 in | Wt 204.0 lb

## 2019-07-10 VITALS — BP 126/60 | HR 67 | Temp 97.5°F | Wt 204.0 lb

## 2019-07-10 DIAGNOSIS — M1A9XX0 Chronic gout, unspecified, without tophus (tophi): Secondary | ICD-10-CM

## 2019-07-10 DIAGNOSIS — Z0000 Encounter for general adult medical examination without abnormal findings: Secondary | ICD-10-CM

## 2019-07-10 DIAGNOSIS — E118 Type 2 diabetes mellitus with unspecified complications: Secondary | ICD-10-CM | POA: Diagnosis not present

## 2019-07-10 DIAGNOSIS — I25810 Atherosclerosis of coronary artery bypass graft(s) without angina pectoris: Secondary | ICD-10-CM | POA: Diagnosis not present

## 2019-07-10 DIAGNOSIS — D649 Anemia, unspecified: Secondary | ICD-10-CM | POA: Diagnosis not present

## 2019-07-10 DIAGNOSIS — M79642 Pain in left hand: Secondary | ICD-10-CM | POA: Insufficient documentation

## 2019-07-10 DIAGNOSIS — I10 Essential (primary) hypertension: Secondary | ICD-10-CM

## 2019-07-10 DIAGNOSIS — E7800 Pure hypercholesterolemia, unspecified: Secondary | ICD-10-CM

## 2019-07-10 LAB — POCT GLYCOSYLATED HEMOGLOBIN (HGB A1C)
Estimated Average Glucose: 148
Hemoglobin A1C: 6.8 % — AB (ref 4.0–5.6)

## 2019-07-10 MED ORDER — NAPROXEN 500 MG PO TABS: 500.0000 mg | ORAL_TABLET | Freq: Two times a day (BID) | ORAL | 1 refills | Status: DC

## 2019-07-10 NOTE — Patient Instructions (Signed)
Todd Olson , Thank you for taking time to come for your Medicare Wellness Visit. I appreciate your ongoing commitment to your health goals. Please review the following plan we discussed and let me know if I can assist you in the future.   Screening recommendations/referrals: Colonoscopy: No longer required.  Recommended yearly ophthalmology/optometry visit for glaucoma screening and checkup Recommended yearly dental visit for hygiene and checkup  Vaccinations: Influenza vaccine: Up to date Pneumococcal vaccine: Completed series Tdap vaccine: Up to date Shingles vaccine: Pt declines today.     Advanced directives: Currently on file  Conditions/risks identified: Continue to work on exercising 3 days a week for at least 30 minutes at a time.   Next appointment: 10:20 AM today with Dr Rosanna Randy   Preventive Care 44 Years and Older, Male Preventive care refers to lifestyle choices and visits with your health care provider that can promote health and wellness. What does preventive care include?  A yearly physical exam. This is also called an annual well check.  Dental exams once or twice a year.  Routine eye exams. Ask your health care provider how often you should have your eyes checked.  Personal lifestyle choices, including:  Daily care of your teeth and gums.  Regular physical activity.  Eating a healthy diet.  Avoiding tobacco and drug use.  Limiting alcohol use.  Practicing safe sex.  Taking low doses of aspirin every day.  Taking vitamin and mineral supplements as recommended by your health care provider. What happens during an annual well check? The services and screenings done by your health care provider during your annual well check will depend on your age, overall health, lifestyle risk factors, and family history of disease. Counseling  Your health care provider may ask you questions about your:  Alcohol use.  Tobacco use.  Drug use.  Emotional  well-being.  Home and relationship well-being.  Sexual activity.  Eating habits.  History of falls.  Memory and ability to understand (cognition).  Work and work Statistician. Screening  You may have the following tests or measurements:  Height, weight, and BMI.  Blood pressure.  Lipid and cholesterol levels. These may be checked every 5 years, or more frequently if you are over 47 years old.  Skin check.  Lung cancer screening. You may have this screening every year starting at age 61 if you have a 30-pack-year history of smoking and currently smoke or have quit within the past 15 years.  Fecal occult blood test (FOBT) of the stool. You may have this test every year starting at age 73.  Flexible sigmoidoscopy or colonoscopy. You may have a sigmoidoscopy every 5 years or a colonoscopy every 10 years starting at age 23.  Prostate cancer screening. Recommendations will vary depending on your family history and other risks.  Hepatitis C blood test.  Hepatitis B blood test.  Sexually transmitted disease (STD) testing.  Diabetes screening. This is done by checking your blood sugar (glucose) after you have not eaten for a while (fasting). You may have this done every 1-3 years.  Abdominal aortic aneurysm (AAA) screening. You may need this if you are a current or former smoker.  Osteoporosis. You may be screened starting at age 72 if you are at high risk. Talk with your health care provider about your test results, treatment options, and if necessary, the need for more tests. Vaccines  Your health care provider may recommend certain vaccines, such as:  Influenza vaccine. This is recommended every year.  Tetanus, diphtheria, and acellular pertussis (Tdap, Td) vaccine. You may need a Td booster every 10 years.  Zoster vaccine. You may need this after age 52.  Pneumococcal 13-valent conjugate (PCV13) vaccine. One dose is recommended after age 26.  Pneumococcal  polysaccharide (PPSV23) vaccine. One dose is recommended after age 84. Talk to your health care provider about which screenings and vaccines you need and how often you need them. This information is not intended to replace advice given to you by your health care provider. Make sure you discuss any questions you have with your health care provider. Document Released: 05/15/2015 Document Revised: 01/06/2016 Document Reviewed: 02/17/2015 Elsevier Interactive Patient Education  2017 [place] Prevention in the Home Falls can cause injuries. They can happen to people of all ages. There are many things you can do to make your home safe and to help prevent falls. What can I do on the outside of my home?  Regularly fix the edges of walkways and driveways and fix any cracks.  Remove anything that might make you trip as you walk through a door, such as a raised step or threshold.  Trim any bushes or trees on the path to your home.  Use bright outdoor lighting.  Clear any walking paths of anything that might make someone trip, such as rocks or tools.  Regularly check to see if handrails are loose or broken. Make sure that both sides of any steps have handrails.  Any raised decks and porches should have guardrails on the edges.  Have any leaves, snow, or ice cleared regularly.  Use sand or salt on walking paths during winter.  Clean up any spills in your garage right away. This includes oil or grease spills. What can I do in the bathroom?  Use night lights.  Install grab bars by the toilet and in the tub and shower. Do not use towel bars as grab bars.  Use non-skid mats or decals in the tub or shower.  If you need to sit down in the shower, use a plastic, non-slip stool.  Keep the floor dry. Clean up any water that spills on the floor as soon as it happens.  Remove soap buildup in the tub or shower regularly.  Attach bath mats securely with double-sided non-slip rug  tape.  Do not have throw rugs and other things on the floor that can make you trip. What can I do in the bedroom?  Use night lights.  Make sure that you have a light by your bed that is easy to reach.  Do not use any sheets or blankets that are too big for your bed. They should not hang down onto the floor.  Have a firm chair that has side arms. You can use this for support while you get dressed.  Do not have throw rugs and other things on the floor that can make you trip. What can I do in the kitchen?  Clean up any spills right away.  Avoid walking on wet floors.  Keep items that you use a lot in easy-to-reach places.  If you need to reach something above you, use a strong step stool that has a grab bar.  Keep electrical cords out of the way.  Do not use floor polish or wax that makes floors slippery. If you must use wax, use non-skid floor wax.  Do not have throw rugs and other things on the floor that can make you trip. What can I do  with my stairs?  Do not leave any items on the stairs.  Make sure that there are handrails on both sides of the stairs and use them. Fix handrails that are broken or loose. Make sure that handrails are as long as the stairways.  Check any carpeting to make sure that it is firmly attached to the stairs. Fix any carpet that is loose or worn.  Avoid having throw rugs at the top or bottom of the stairs. If you do have throw rugs, attach them to the floor with carpet tape.  Make sure that you have a light switch at the top of the stairs and the bottom of the stairs. If you do not have them, ask someone to add them for you. What else can I do to help prevent falls?  Wear shoes that:  Do not have high heels.  Have rubber bottoms.  Are comfortable and fit you well.  Are closed at the toe. Do not wear sandals.  If you use a stepladder:  Make sure that it is fully opened. Do not climb a closed stepladder.  Make sure that both sides of the  stepladder are locked into place.  Ask someone to hold it for you, if possible.  Clearly mark and make sure that you can see:  Any grab bars or handrails.  First and last steps.  Where the edge of each step is.  Use tools that help you move around (mobility aids) if they are needed. These include:  Canes.  Walkers.  Scooters.  Crutches.  Turn on the lights when you go into a dark area. Replace any light bulbs as soon as they burn out.  Set up your furniture so you have a clear path. Avoid moving your furniture around.  If any of your floors are uneven, fix them.  If there are any pets around you, be aware of where they are.  Review your medicines with your doctor. Some medicines can make you feel dizzy. This can increase your chance of falling. Ask your doctor what other things that you can do to help prevent falls. This information is not intended to replace advice given to you by your health care provider. Make sure you discuss any questions you have with your health care provider. Document Released: 02/12/2009 Document Revised: 09/24/2015 Document Reviewed: 05/23/2014 Elsevier Interactive Patient Education  2017 [employer].

## 2019-07-10 NOTE — Telephone Encounter (Signed)
Please advise 

## 2019-07-11 LAB — CBC WITH DIFFERENTIAL/PLATELET
Basophils Absolute: 0.1 10*3/uL (ref 0.0–0.2)
Basos: 1 %
EOS (ABSOLUTE): 0.3 10*3/uL (ref 0.0–0.4)
Eos: 4 %
Hematocrit: 41.5 % (ref 37.5–51.0)
Hemoglobin: 13.6 g/dL (ref 13.0–17.7)
Immature Grans (Abs): 0 10*3/uL (ref 0.0–0.1)
Immature Granulocytes: 1 %
Lymphocytes Absolute: 1.2 10*3/uL (ref 0.7–3.1)
Lymphs: 16 %
MCH: 29.7 pg (ref 26.6–33.0)
MCHC: 32.8 g/dL (ref 31.5–35.7)
MCV: 91 fL (ref 79–97)
Monocytes Absolute: 0.5 10*3/uL (ref 0.1–0.9)
Monocytes: 7 %
Neutrophils Absolute: 5.4 10*3/uL (ref 1.4–7.0)
Neutrophils: 71 %
Platelets: 231 10*3/uL (ref 150–450)
RBC: 4.58 x10E6/uL (ref 4.14–5.80)
RDW: 13.6 % (ref 11.6–15.4)
WBC: 7.5 10*3/uL (ref 3.4–10.8)

## 2019-07-11 LAB — COMPREHENSIVE METABOLIC PANEL
ALT: 23 IU/L (ref 0–44)
AST: 27 IU/L (ref 0–40)
Albumin/Globulin Ratio: 2.4 — ABNORMAL HIGH (ref 1.2–2.2)
Albumin: 4.6 g/dL (ref 3.7–4.7)
Alkaline Phosphatase: 83 IU/L (ref 39–117)
BUN/Creatinine Ratio: 20 (ref 10–24)
BUN: 20 mg/dL (ref 8–27)
Bilirubin Total: 0.5 mg/dL (ref 0.0–1.2)
CO2: 23 mmol/L (ref 20–29)
Calcium: 10 mg/dL (ref 8.6–10.2)
Chloride: 100 mmol/L (ref 96–106)
Creatinine, Ser: 1.01 mg/dL (ref 0.76–1.27)
GFR calc Af Amer: 83 mL/min/{1.73_m2} (ref >59)
GFR calc non Af Amer: 71 mL/min/{1.73_m2} (ref >59)
Globulin, Total: 1.9 g/dL (ref 1.5–4.5)
Glucose: 123 mg/dL — ABNORMAL HIGH (ref 65–99)
Potassium: 3.9 mmol/L (ref 3.5–5.2)
Sodium: 140 mmol/L (ref 134–144)
Total Protein: 6.5 g/dL (ref 6.0–8.5)

## 2019-07-11 LAB — TSH: TSH: 0.984 u[IU]/mL (ref 0.450–4.500)

## 2019-07-11 LAB — LIPID PANEL
Chol/HDL Ratio: 2.5 ratio (ref 0.0–5.0)
Cholesterol, Total: 123 mg/dL (ref 100–199)
HDL: 49 mg/dL (ref >39)
LDL Chol Calc (NIH): 45 mg/dL (ref 0–99)
Triglycerides: 179 mg/dL — ABNORMAL HIGH (ref 0–149)
VLDL Cholesterol Cal: 29 mg/dL (ref 5–40)

## 2019-07-11 MED ORDER — ALLOPURINOL 100 MG PO TABS: 100.0000 mg | ORAL_TABLET | Freq: Every day | ORAL | 3 refills | Status: DC

## 2019-07-15 DIAGNOSIS — E119 Type 2 diabetes mellitus without complications: Secondary | ICD-10-CM | POA: Diagnosis not present

## 2019-07-15 LAB — HM DIABETES EYE EXAM

## 2019-07-18 ENCOUNTER — Telehealth: Payer: Self-pay

## 2019-07-18 NOTE — Telephone Encounter (Signed)
Copied from [place] [id number]. Topic: General - Other >> Jul 18, 2019  1:22 PM Celene Kras wrote: Reason for CRM: Pt called stating  that he received a call stating that [facility] was calling him. The automated message asked for is name and after he confirmed it was himself the message began to talk about [facility] and how it was teaming up with [facility]. Pt is concerned as he looked up the number and it was a scam  Pt provided the number [phone] as the scam number. Please advise.

## 2019-07-23 ENCOUNTER — Other Ambulatory Visit: Payer: Self-pay | Admitting: *Deleted

## 2019-07-23 DIAGNOSIS — I10 Essential (primary) hypertension: Secondary | ICD-10-CM

## 2019-07-23 MED ORDER — AMLODIPINE BESYLATE 5 MG PO TABS: 5.0000 mg | ORAL_TABLET | Freq: Every day | ORAL | 3 refills | Status: DC

## 2019-10-03 ENCOUNTER — Other Ambulatory Visit: Payer: Self-pay | Admitting: Family Medicine

## 2019-10-03 DIAGNOSIS — E7800 Pure hypercholesterolemia, unspecified: Secondary | ICD-10-CM

## 2019-10-15 ENCOUNTER — Telehealth: Payer: Self-pay | Admitting: Family Medicine

## 2019-10-15 NOTE — Chronic Care Management (AMB) (Signed)
  Chronic Care Management   Outreach Note  10/15/2019 Name: Todd Olson MRN: [id number] DOB: May 12, 1940  Todd Olson is a 79 y.o. year old male who is a primary care patient of Jerrol Banana., MD. I reached out to [name] by phone today in response to a referral sent by [name] [name] health plan.     An unsuccessful telephone outreach was attempted today. The patient was referred to the case management team for assistance with care management and care coordination.   Follow Up Plan: A HIPPA compliant phone message was left for the patient providing contact information and requesting a return call.  The care management team will reach out to the patient again over the next 7 days.  If patient returns call to provider office, please advise to call [facility]  at [place], [place], [place], [place] [address] Direct Dial: [phone] [name]: [email or web address]

## 2019-10-17 ENCOUNTER — Other Ambulatory Visit: Payer: Self-pay | Admitting: Family Medicine

## 2019-10-21 NOTE — Chronic Care Management (AMB) (Signed)
  Chronic Care Management   Note  10/21/2019 Name: RAYLYN SPECKMAN MRN: [id number] DOB: Apr 09, 1941  EMITT MAGLIONE is a 79 y.o. year old male who is a primary care patient of Jerrol Banana., MD. I reached out to [name] by phone today in response to a referral sent by Mr. [facility] health plan.     Mr. Ihnen was given information about Chronic Care Management services today including:  1. CCM service includes personalized support from designated clinical staff supervised by his physician, including individualized plan of care and coordination with other care providers 2. 24/7 contact phone numbers for assistance for urgent and routine care needs. 3. Service will only be billed when office clinical staff spend 20 minutes or more in a month to coordinate care. 4. Only one practitioner may furnish and bill the service in a calendar month. 5. The patient may stop CCM services at any time (effective at the end of the month) by phone call to the office staff. 6. The patient will be responsible for cost sharing (co-pay) of up to 20% of the service fee (after annual deductible is met).  Patient agreed to services and verbal consent obtained.   Follow up plan: Telephone appointment with care management team member scheduled for:11/21/2019  Noreene Larsson, [address], [place], [place] [address] Direct Dial: [phone] [email or web address] Website: [email or web address]

## 2019-10-30 ENCOUNTER — Other Ambulatory Visit: Payer: Self-pay | Admitting: Family Medicine

## 2019-10-30 NOTE — Progress Notes (Signed)
Established patient visit  I,[name],acting as a scribe for [name], MD.,have documented all relevant documentation on the behalf of [name], MD,as directed by  [name], MD while in the presence of [name], MD.   Patient: Todd Olson   DOB: May 17, 1940   79 y.o. Male  MRN: [id number] Visit Date: 11/11/2019  Today's healthcare provider: [name], MD   Chief Complaint  Patient presents with  . Diabetes  . Follow-up  . Hypertension   Subjective    HPI  Diabetes Mellitus Type II, follow-up  Lab Results  Component Value Date   HGBA1C 6.7 (A) 11/11/2019   HGBA1C 6.8 (A) 07/10/2019   HGBA1C 6.8 (A) 03/07/2019   Last seen for diabetes 4 months ago.  Management since then includes continuing the same treatment. He reports good compliance with treatment. He is not having side effects. none  Home blood sugar records: fasting range: 140-150  Episodes of hypoglycemia? No none   Current insulin regiment: none Most Recent Eye Exam: 07/15/2019  --------------------------------------------------------------------  Hypertension, follow-up  BP Readings from Last 3 Encounters:  11/11/19 (!) 144/68  07/10/19 126/60  07/10/19 126/60   Wt Readings from Last 3 Encounters:  11/11/19 206 lb (93.4 kg)  07/10/19 204 lb (92.5 kg)  07/10/19 204 lb (92.5 kg)     He was last seen for hypertension 4 months ago.  BP at that visit was 126/60. Management since that visit includes; no changes. He reports good compliance with treatment. He is not having side effects. none He is not exercising. He is adherent to low salt diet.   Outside blood pressures are 120/60.  He does not smoke.  Use of agents associated with hypertension: none.   --------------------------------------------------------------------   Patient Active Problem List   Diagnosis Date Noted  . Encounter for screening colonoscopy   . DM (diabetes  mellitus) with complications ([place]) 26/37/8588  . Arteriosclerosis of nonautologous coronary artery bypass graft 09/10/2014  . Back pain, chronic 09/10/2014  . Diabetes mellitus, type 2 ([place]) 09/10/2014  . Well controlled diabetes mellitus ([place]) 09/10/2014  . Hemorrhoid 09/10/2014  . HLD (hyperlipidemia) 09/10/2014  . BP (high blood pressure) 09/10/2014  . Fatty tumor 09/10/2014  . Absolute anemia 09/10/2014  . Muscle ache 09/10/2014  . Arthritis of hand, degenerative 09/10/2014  . Benign neoplasm of colon 09/10/2014  . Rotator cuff syndrome 09/10/2014  . Head revolving around 09/10/2014  . Adynamia 09/10/2014   Social History   Tobacco Use  . Smoking status: Former Smoker    Packs/day: 2.00    Years: 22.00    Pack years: 44.00    Types: Cigarettes    Quit date: 11/17/1977    Years since quitting: 42.0  . Smokeless tobacco: Never Used  Vaping Use  . Vaping Use: Never used  Substance Use Topics  . Alcohol use: No    Alcohol/week: 0.0 standard drinks  . Drug use: No       Medications: Outpatient Medications Prior to Visit  Medication Sig  . allopurinol (ZYLOPRIM) 100 MG tablet Take 1 tablet (100 mg total) by mouth daily. Start on Feb 20,2020  . amLODipine (NORVASC) 5 MG tablet Take 1 tablet (5 mg total) by mouth daily.  Marland Kitchen aspirin 81 MG tablet Take 81 mg by mouth every other day. At night  . Cholecalciferol 1000 UNITS capsule Take 1,000 Units by mouth daily. am  . clopidogrel (PLAVIX) 75 MG tablet Take  1 tablet by mouth once daily  . colchicine 0.6 MG tablet Take 1 tablet (0.6 mg total) by mouth daily. (Patient taking differently: Take 0.6 mg by mouth daily. am)  . furosemide (LASIX) 40 MG tablet Take 1 tablet (40 mg total) by mouth daily. am  . Glucosamine-Chondroit-Vit C-Mn (GLUCOSAMINE CHONDR 1500 COMPLX) CAPS Take 1 capsule by mouth 2 (two) times daily.   Marland Kitchen lisinopril-hydrochlorothiazide (ZESTORETIC) 20-12.5 MG tablet Take 1 tablet by mouth once daily  . magnesium  oxide (MAG-OX) 400 (241.3 MG) MG tablet Take 400 mg by mouth 2 (two) times daily. Am and pm  . metFORMIN (GLUCOPHAGE) 1000 MG tablet TAKE 1 TABLET BY MOUTH TWICE DAILY WITH MEALS  . metoprolol tartrate (LOPRESSOR) 25 MG tablet Take 1 tablet by mouth twice daily  . Omega-3 Fatty Acids (FISH OIL) 1000 MG CAPS Take 525 mg by mouth daily.   Glory Rosebush ULTRA test strip USE TO CHECK BLOOD SUGAR TWICE DAILY  . pioglitazone (ACTOS) 45 MG tablet Take 1 tablet (45 mg total) by mouth daily. Take one tablet daily/am  . rosuvastatin (CRESTOR) 10 MG tablet Take 1 tablet by mouth once daily  . colchicine 0.6 MG tablet Take 1 tablet (0.6 mg total) by mouth daily. (Patient not taking: Reported on 11/11/2019)  . ferrous sulfate 325 (65 FE) MG tablet Take 325 mg by mouth daily. (Patient not taking: Reported on 11/11/2019)  . lovastatin (MEVACOR) 40 MG tablet TAKE 1 TABLET BY MOUTH AT BEDTIME (Patient not taking: No sig reported)  . naproxen (NAPROSYN) 500 MG tablet Take 1 tablet (500 mg total) by mouth 2 (two) times daily with a meal. (Patient not taking: Reported on 11/11/2019)  . ondansetron (ZOFRAN) 4 MG tablet Take 1 tablet (4 mg total) by mouth every 8 (eight) hours as needed for nausea or vomiting. (Patient not taking: Reported on 07/10/2019)  . tiZANidine (ZANAFLEX) 2 MG tablet Take 2 mg by mouth 3 (three) times daily as needed. (Patient not taking: Reported on 11/11/2019)   No facility-administered medications prior to visit.    Review of Systems  Constitutional: Negative for appetite change, chills and fever.  Respiratory: Negative for chest tightness, shortness of breath and wheezing.   Cardiovascular: Negative for chest pain and palpitations.  Gastrointestinal: Negative for abdominal pain, nausea and vomiting.    Last hemoglobin A1c Lab Results  Component Value Date   HGBA1C 6.7 (A) 11/11/2019      Objective    BP (!) 144/68 (BP Location: Right Arm, Patient Position: Sitting, Cuff Size: Large)    Pulse 66   Temp 97.7 F (36.5 C) (Other (Comment))   Resp 18   Ht 5\' 9"  (1.753 m)   Wt 206 lb (93.4 kg)   SpO2 96%   BMI 30.42 kg/m  BP Readings from Last 3 Encounters:  11/11/19 (!) 144/68  07/10/19 126/60  07/10/19 126/60   Wt Readings from Last 3 Encounters:  11/11/19 206 lb (93.4 kg)  07/10/19 204 lb (92.5 kg)  07/10/19 204 lb (92.5 kg)      Physical Exam Vitals reviewed.  Constitutional:      Appearance: Normal appearance. He is well-developed and normal weight.  HENT:     Head: Normocephalic and atraumatic.     Right Ear: Tympanic membrane, ear canal and external ear normal.     Left Ear: Tympanic membrane, ear canal and external ear normal.     Nose: Nose normal.     Mouth/Throat:     Mouth:  Mucous membranes are moist.     Pharynx: Oropharynx is clear.  Eyes:     General: No scleral icterus.    Extraocular Movements: Extraocular movements intact.     Conjunctiva/sclera: Conjunctivae normal.     Pupils: Pupils are equal, round, and reactive to light.  Neck:     Thyroid: No thyromegaly.     Vascular: No carotid bruit.  Cardiovascular:     Rate and Rhythm: Normal rate and regular rhythm.     Pulses: Normal pulses.     Heart sounds: Normal heart sounds.  Pulmonary:     Effort: Pulmonary effort is normal.     Breath sounds: Normal breath sounds.  Abdominal:     General: Abdomen is flat. Bowel sounds are normal.     Palpations: Abdomen is soft.  Genitourinary:    Penis: Normal.      Testes: Normal.     Prostate: Normal.     Rectum: Normal.  Musculoskeletal:     Cervical back: Normal range of motion and neck supple.  Lymphadenopathy:     Cervical: No cervical adenopathy.  Skin:    General: Skin is warm and dry.  Neurological:     General: No focal deficit present.     Mental Status: He is alert and oriented to person, place, and time. Mental status is at baseline.  Psychiatric:        Mood and Affect: Mood normal.        Behavior: Behavior normal.         Thought Content: Thought content normal.        Judgment: Judgment normal.       Results for orders placed or performed in visit on 11/11/19  POCT HgB A1C  Result Value Ref Range   Hemoglobin A1C 6.7 (A) 4.0 - 5.6 %   Est. average glucose Bld gHb Est-mCnc 146     Assessment & Plan     1. DM (diabetes mellitus) with complications (HCC) N8G today is 6.7.  Good control in this 79 year old.  On Metformin and pioglitazone. - POCT HgB A1C  2. Essential hypertension Controlled on amlodipine and lisinopril HCT  3. Gout, unspecified cause, unspecified chronicity, unspecified site On allopurinol 100 mg daily  4. Arteriosclerosis of nonautologous coronary artery bypass graft All risk factors treated. 5.  Hyperlipidemia On rosuvastatin  No follow-ups on file.      I, [name], MD, have reviewed all documentation for this visit. The documentation on 11/13/19 for the exam, diagnosis, procedures, and orders are all accurate and complete.    Adriena Manfre Cranford Mon, MD  [facility] [phone] (phone) [phone] (fax)  [place]

## 2019-10-30 NOTE — Telephone Encounter (Signed)
Requested Prescriptions  Pending Prescriptions Disp Refills  . metFORMIN (GLUCOPHAGE) 1000 MG tablet [Pharmacy Med Name: metFORMIN HCl 1000 MG Oral Tablet] 180 tablet 0    Sig: TAKE 1 TABLET BY MOUTH TWICE DAILY WITH MEALS     Endocrinology:  Diabetes - Biguanides Passed - 10/30/2019  6:32 PM      Passed - Cr in normal range and within 360 days    Creatinine  Date Value Ref Range Status  03/27/2013 1.26 0.60 - 1.30 mg/dL Final   Creatinine, Ser  Date Value Ref Range Status  07/10/2019 1.01 0.76 - 1.27 mg/dL Final         Passed - HBA1C is between 0 and 7.9 and within 180 days    Hemoglobin A1C  Date Value Ref Range Status  07/10/2019 6.8 (A) 4.0 - 5.6 % Final   Hgb A1c MFr Bld  Date Value Ref Range Status  06/13/2017 6.4 (H) 4.8 - 5.6 % Final    Comment:             Prediabetes: 5.7 - 6.4          Diabetes: >6.4          Glycemic control for adults with diabetes: <7.0          Passed - eGFR in normal range and within 360 days    EGFR (African American)  Date Value Ref Range Status  03/27/2013 >60  Final   GFR calc Af Amer  Date Value Ref Range Status  07/10/2019 83 >59 mL/min/1.73 Final   EGFR (Non-African Amer.)  Date Value Ref Range Status  03/27/2013 57 (L)  Final    Comment:    eGFR values <17m/min/1.73 m2 may be an indication of chronic kidney disease (CKD). Calculated eGFR is useful in patients with stable renal function. The eGFR calculation will not be reliable in acutely ill patients when serum creatinine is changing rapidly. It is not useful in  patients on dialysis. The eGFR calculation may not be applicable to patients at the low and high extremes of body sizes, pregnant women, and vegetarians.    GFR calc non Af Amer  Date Value Ref Range Status  07/10/2019 71 >59 mL/min/1.73 Final         Passed - Valid encounter within last 6 months    Recent Outpatient Visits          3 months ago [facility] physical [facility]   7 months ago Type 2 diabetes mellitus without complication, without long-term current use of insulin ([facility]   [facility]   11 months ago Type 2 diabetes mellitus without complication, without long-term current use of insulin ([facility]   [facility]   1 year [facility]   1 year ago Gouty arthritis of left great toe   [facility]      Future Appointments            In 1 week [facility]  In 9 months [facility], [facility]

## 2019-11-11 ENCOUNTER — Other Ambulatory Visit: Payer: Self-pay

## 2019-11-11 ENCOUNTER — Ambulatory Visit (INDEPENDENT_AMBULATORY_CARE_PROVIDER_SITE_OTHER): Payer: Medicare Other | Admitting: Family Medicine

## 2019-11-11 ENCOUNTER — Encounter: Payer: Self-pay | Admitting: Family Medicine

## 2019-11-11 VITALS — BP 144/68 | HR 66 | Temp 97.7°F | Resp 18 | Ht 69.0 in | Wt 206.0 lb

## 2019-11-11 DIAGNOSIS — I10 Essential (primary) hypertension: Secondary | ICD-10-CM

## 2019-11-11 DIAGNOSIS — E118 Type 2 diabetes mellitus with unspecified complications: Secondary | ICD-10-CM | POA: Diagnosis not present

## 2019-11-11 DIAGNOSIS — M109 Gout, unspecified: Secondary | ICD-10-CM

## 2019-11-11 DIAGNOSIS — I25810 Atherosclerosis of coronary artery bypass graft(s) without angina pectoris: Secondary | ICD-10-CM

## 2019-11-11 DIAGNOSIS — E7800 Pure hypercholesterolemia, unspecified: Secondary | ICD-10-CM | POA: Diagnosis not present

## 2019-11-11 LAB — POCT GLYCOSYLATED HEMOGLOBIN (HGB A1C)
Est. average glucose Bld gHb Est-mCnc: 146
Hemoglobin A1C: 6.7 % — AB (ref 4.0–5.6)

## 2019-11-11 NOTE — Progress Notes (Signed)
Chief Complaint  Patient presents with  . Diabetes  . Follow-up  . Hypertension   Subjective:     Patient ID: Todd Olson, male   DOB: 1941/01/09, 79 y.o.   MRN: [id number]  HPI  Todd Olson presents today for follow-up of diabetes mellitus and hypertension. He has no complaints. He does report his recorded fasting blood sugars are higher (140-150s) than before starting rosuvastatin, allopurinol, and colchicine, but A1C has remained steady.   Diabetes Mellitus Type II, Follow-up  Lab Results  Component Value Date   HGBA1C 6.7 (A) 11/11/2019   HGBA1C 6.8 (A) 07/10/2019   HGBA1C 6.8 (A) 03/07/2019   Wt Readings from Last 3 Encounters:  11/11/19 206 lb (93.4 kg)  07/10/19 204 lb (92.5 kg)  07/10/19 204 lb (92.5 kg)   Last seen for diabetes 4 months ago.  Management since then includes continuing with the same treatment. He reports good compliance with treatment. He is not having side effects.  Home blood sugar records: fasting range: 140-150  Episodes of hypoglycemia? No    Current insulin regiment: none Most Recent Eye Exam: 07/15/2019  ----------------------------------------------------------------------  Hypertension, follow-up  BP Readings from Last 3 Encounters:  11/11/19 (!) 144/68  07/10/19 126/60  07/10/19 126/60   Wt Readings from Last 3 Encounters:  11/11/19 206 lb (93.4 kg)  07/10/19 204 lb (92.5 kg)  07/10/19 204 lb (92.5 kg)     He was last seen for hypertension 4 months ago.  BP at that visit was 126/60. Management since that visit includes no changes He reports good compliance with treatment. He is not having side effects.  He is following a Low Sodium diet. He is not exercising. He does not smoke  Use of agents associated with hypertension: none.   Pertinent labs: Lab Results  Component Value Date   CHOL 123 07/10/2019   HDL 49 07/10/2019   LDLCALC 45 07/10/2019   TRIG 179 (H) 07/10/2019   CHOLHDL 2.5 07/10/2019   Lab Results   Component Value Date   NA 140 07/10/2019   K 3.9 07/10/2019   CREATININE 1.01 07/10/2019   GFRNONAA 71 07/10/2019   GFRAA 83 07/10/2019   GLUCOSE 123 (H) 07/10/2019     The ASCVD Risk score (Goff DC Jr., et al., 2013) failed to calculate for the following reasons:   The patient has a prior MI or stroke diagnosis   --------------------------------------------------------------------------------------------------- Patient Active Problem List   Diagnosis Date Noted  . Encounter for screening colonoscopy   . DM (diabetes mellitus) with complications ([place]) 61/53/7943  . Arteriosclerosis of nonautologous coronary artery bypass graft 09/10/2014  . Back pain, chronic 09/10/2014  . Diabetes mellitus, type 2 ([place]) 09/10/2014  . Well controlled diabetes mellitus ([place]) 09/10/2014  . Hemorrhoid 09/10/2014  . HLD (hyperlipidemia) 09/10/2014  . BP (high blood pressure) 09/10/2014  . Fatty tumor 09/10/2014  . Absolute anemia 09/10/2014  . Muscle ache 09/10/2014  . Arthritis of hand, degenerative 09/10/2014  . Benign neoplasm of colon 09/10/2014  . Rotator cuff syndrome 09/10/2014  . Head revolving around 09/10/2014  . Adynamia 09/10/2014   Social History   Tobacco Use  . Smoking status: Former Smoker    Packs/day: 2.00    Years: 22.00    Pack years: 44.00    Types: Cigarettes    Quit date: 11/17/1977    Years since quitting: 42.0  . Smokeless tobacco: Never Used  Vaping Use  . Vaping Use: Never used  Substance Use Topics  .  Alcohol use: No    Alcohol/week: 0.0 standard drinks  . Drug use: No    Current Outpatient Medications:  .  allopurinol (ZYLOPRIM) 100 MG tablet, Take 1 tablet (100 mg total) by mouth daily. Start on Feb 20,2020, Disp: 90 tablet, Rfl: 3 .  amLODipine (NORVASC) 5 MG tablet, Take 1 tablet (5 mg total) by mouth daily., Disp: 90 tablet, Rfl: 3 .  aspirin 81 MG tablet, Take 81 mg by mouth every other day. At night, Disp: , Rfl:  .  Cholecalciferol 1000 UNITS  capsule, Take 1,000 Units by mouth daily. am, Disp: , Rfl:  .  clopidogrel (PLAVIX) 75 MG tablet, Take 1 tablet by mouth once daily, Disp: 30 tablet, Rfl: 11 .  colchicine 0.6 MG tablet, Take 1 tablet (0.6 mg total) by mouth daily. (Patient taking differently: Take 0.6 mg by mouth daily. am), Disp: 30 tablet, Rfl: 5 .  furosemide (LASIX) 40 MG tablet, Take 1 tablet (40 mg total) by mouth daily. am, Disp: 90 tablet, Rfl: 3 .  Glucosamine-Chondroit-Vit C-Mn (GLUCOSAMINE CHONDR 1500 COMPLX) CAPS, Take 1 capsule by mouth 2 (two) times daily. , Disp: , Rfl:  .  lisinopril-hydrochlorothiazide (ZESTORETIC) 20-12.5 MG tablet, Take 1 tablet by mouth once daily, Disp: 90 tablet, Rfl: 3 .  magnesium oxide (MAG-OX) 400 (241.3 MG) MG tablet, Take 400 mg by mouth 2 (two) times daily. Am and pm, Disp: , Rfl:  .  metFORMIN (GLUCOPHAGE) 1000 MG tablet, TAKE 1 TABLET BY MOUTH TWICE DAILY WITH MEALS, Disp: 180 tablet, Rfl: 0 .  metoprolol tartrate (LOPRESSOR) 25 MG tablet, Take 1 tablet by mouth twice daily, Disp: 180 tablet, Rfl: 0 .  Omega-3 Fatty Acids (FISH OIL) 1000 MG CAPS, Take 525 mg by mouth daily. , Disp: , Rfl:  .  ONETOUCH ULTRA test strip, USE TO CHECK BLOOD SUGAR TWICE DAILY, Disp: 100 each, Rfl: 5 .  pioglitazone (ACTOS) 45 MG tablet, Take 1 tablet (45 mg total) by mouth daily. Take one tablet daily/am, Disp: 90 tablet, Rfl: 3 .  rosuvastatin (CRESTOR) 10 MG tablet, Take 1 tablet by mouth once daily, Disp: 90 tablet, Rfl: 0 .  colchicine 0.6 MG tablet, Take 1 tablet (0.6 mg total) by mouth daily. (Patient not taking: Reported on 11/11/2019), Disp: 90 tablet, Rfl: 3 .  ferrous sulfate 325 (65 FE) MG tablet, Take 325 mg by mouth daily. (Patient not taking: Reported on 11/11/2019), Disp: , Rfl:  .  lovastatin (MEVACOR) 40 MG tablet, TAKE 1 TABLET BY MOUTH AT BEDTIME (Patient not taking: No sig reported), Disp: 90 tablet, Rfl: 3 .  naproxen (NAPROSYN) 500 MG tablet, Take 1 tablet (500 mg total) by mouth 2  (two) times daily with a meal. (Patient not taking: Reported on 11/11/2019), Disp: 60 tablet, Rfl: 1 .  ondansetron (ZOFRAN) 4 MG tablet, Take 1 tablet (4 mg total) by mouth every 8 (eight) hours as needed for nausea or vomiting. (Patient not taking: Reported on 07/10/2019), Disp: 30 tablet, Rfl: 0 .  tiZANidine (ZANAFLEX) 2 MG tablet, Take 2 mg by mouth 3 (three) times daily as needed. (Patient not taking: Reported on 11/11/2019), Disp: , Rfl:    No facilitiy-administered medications prior to visit.  Review of Systems  Constitutional: Negative for appetite changes, chills, and fever. Respiratory: Negative for chest tightness, shortness of be   Objective:   Physical Exam  General: well-appearing gentleman in no acute distress, frustrated about wait but excited to talk about his cars HEENT: head atraumatic,  pupils equal and round, no scleral icterus; external auricle without trauma; nose clear; moist mucous membranes CV: regular rate and rhythm, no murmurs appreciated on exam Resp: clear to auscultation bilaterally Neuro: alert and oriented  Today's Vitals   11/11/19 1026  BP: (!) 144/68  Pulse: 66  Resp: 18  Temp: 97.7 F (36.5 C)  TempSrc: Other (Comment)  SpO2: 96%  Weight: 206 lb (93.4 kg)  Height: 5\' 9"  (1.753 m)   Body mass index is 30.42 kg/m.    Assessment:     Mr. Schlafer is a 79 year old male who presents today for follow up of diabetes mellitus and hypertension. He has no complaints and only concern was increased measured fasting blood sugars at home. As A1C has remained stable, all diabetes medications were continued. Gout medications were changed as listed below.    Plan:    DM (diabetes mellitus) with complications ([place]) - Plan: POCT HgB A1C  Essential hypertension  Gout, unspecified cause, unspecified chronicity, unspecified site  1. DM (diabetes mellitus) with complications ([place]) - Continue current medications - follow up 4 months - Foot exam in 9  months  2. Essential hypertension - Continue current medications - follow up in 4 months  3. Gout, unspecified cause, unspecified chronicity, unspecified site - Switch colchicine from daily to as needed - Continue allopurinol  4. Status-post coronary artery bypass graft - Stop fish oil supplement.     Rodrigo Ran, MS3

## 2019-11-11 NOTE — Patient Instructions (Addendum)
Stop Fish Oil. Start taking colchicine as needed for gout.

## 2019-11-21 ENCOUNTER — Ambulatory Visit: Payer: Medicare Other

## 2019-11-21 NOTE — Chronic Care Management (AMB) (Signed)
  Chronic Care Management   Outreach Note  11/21/2019 Name: Todd Olson MRN: [id number] DOB: 08/14/1940   Primary Care Provider: Jerrol Banana., MD Reason for referral : Chronic Care Management   Todd Olson was referred to the case management team for assistance with care management and care coordination by his health plan. He was contacted today for the initial assessment. Expressed concerns regarding need for program enrollment. Indicated that he is doing well and his chronic diseases are well managed. No concerns regarding medications or prescription costs. Confirmed recent appointment with Dr. Rosanna Randy on 11/11/19. Denied need for further outreach.    PLAN -Will update enrollment status.   Horris Latino [facility] Care Management [phone]

## 2019-12-17 DIAGNOSIS — I2119 ST elevation (STEMI) myocardial infarction involving other coronary artery of inferior wall: Secondary | ICD-10-CM | POA: Diagnosis not present

## 2019-12-17 DIAGNOSIS — I2510 Atherosclerotic heart disease of native coronary artery without angina pectoris: Secondary | ICD-10-CM | POA: Diagnosis not present

## 2019-12-17 DIAGNOSIS — I10 Essential (primary) hypertension: Secondary | ICD-10-CM | POA: Diagnosis not present

## 2019-12-17 DIAGNOSIS — I493 Ventricular premature depolarization: Secondary | ICD-10-CM | POA: Diagnosis not present

## 2019-12-17 DIAGNOSIS — I25810 Atherosclerosis of coronary artery bypass graft(s) without angina pectoris: Secondary | ICD-10-CM | POA: Diagnosis not present

## 2019-12-31 ENCOUNTER — Other Ambulatory Visit: Payer: Self-pay | Admitting: Family Medicine

## 2019-12-31 DIAGNOSIS — E7800 Pure hypercholesterolemia, unspecified: Secondary | ICD-10-CM

## 2020-01-06 ENCOUNTER — Other Ambulatory Visit: Payer: Self-pay | Admitting: Family Medicine

## 2020-01-16 ENCOUNTER — Other Ambulatory Visit: Payer: Self-pay | Admitting: Family Medicine

## 2020-01-31 ENCOUNTER — Other Ambulatory Visit: Payer: Self-pay | Admitting: Family Medicine

## 2020-02-14 ENCOUNTER — Other Ambulatory Visit: Payer: Self-pay | Admitting: Family Medicine

## 2020-02-14 DIAGNOSIS — E119 Type 2 diabetes mellitus without complications: Secondary | ICD-10-CM

## 2020-02-19 ENCOUNTER — Other Ambulatory Visit: Payer: Self-pay

## 2020-02-19 ENCOUNTER — Ambulatory Visit (INDEPENDENT_AMBULATORY_CARE_PROVIDER_SITE_OTHER): Payer: Medicare Other

## 2020-02-19 DIAGNOSIS — Z23 Encounter for immunization: Secondary | ICD-10-CM | POA: Diagnosis not present

## 2020-03-06 ENCOUNTER — Other Ambulatory Visit: Payer: Self-pay | Admitting: Family Medicine

## 2020-03-13 NOTE — Progress Notes (Signed)
I,[name],acting as a scribe for [name], MD.,have documented all relevant documentation on the behalf of [name], MD,as directed by  [name], MD while in the presence of [name], MD.   Established patient visit   Patient: Todd Olson   DOB: March 21, 1941   79 y.o. Male  MRN: [id number] Visit Date: 03/16/2020  Today's healthcare provider: [name], MD   Chief Complaint  Patient presents with  . Diabetes  . Follow-up  . Hypertension   Subjective    HPI  Overall patient is feeling well.  He is having occasional lightheaded spells when he stands up. His only real concern today is 1 of mild memory loss.  He states he does not remember names like he used to. Diabetes Mellitus Type II, follow-up  Lab Results  Component Value Date   HGBA1C 6.8 (A) 03/16/2020   HGBA1C 6.7 (A) 11/11/2019   HGBA1C 6.8 (A) 07/10/2019   Last seen for diabetes 4 months ago.  Management since then includes; Good control in this 79 year old.  On Metformin and pioglitazone. He reports good compliance with treatment. He is not having side effects. none  Home blood sugar records: fasting range: 140-160  Episodes of hypoglycemia? No none   Current insulin regiment: n/a Most Recent Eye Exam: 07/15/2019  --------------------------------------------------------------------  Hypertension, follow-up  BP Readings from Last 3 Encounters:  03/16/20 (!) 144/65  11/11/19 (!) 144/68  07/10/19 126/60   Wt Readings from Last 3 Encounters:  03/16/20 207 lb (93.9 kg)  11/11/19 206 lb (93.4 kg)  07/10/19 204 lb (92.5 kg)     He was last seen for hypertension 4 months ago.  BP at that visit was 144/68. Management since that visit includes; Controlled on amlodipine and lisinopril HCT. He reports good compliance with treatment. He is not having side effects. none He is exercising. He is adherent to low salt diet.   Outside blood pressures are  120/70.  He does not smoke.  Use of agents associated with hypertension: none.   --------------------------------------------------------------------      Medications: Outpatient Medications Prior to Visit  Medication Sig  . allopurinol (ZYLOPRIM) 100 MG tablet Take 1 tablet (100 mg total) by mouth daily. Start on Feb 20,2020  . amLODipine (NORVASC) 5 MG tablet Take 1 tablet (5 mg total) by mouth daily.  Marland Kitchen aspirin 81 MG tablet Take 81 mg by mouth every other day. At night  . Cholecalciferol 1000 UNITS capsule Take 1,000 Units by mouth daily. am  . clopidogrel (PLAVIX) 75 MG tablet Take 1 tablet by mouth once daily  . furosemide (LASIX) 40 MG tablet Take 1 tablet (40 mg total) by mouth daily. am  . Glucosamine-Chondroit-Vit C-Mn (GLUCOSAMINE CHONDR 1500 COMPLX) CAPS Take 1 capsule by mouth 2 (two) times daily.   Marland Kitchen lisinopril-hydrochlorothiazide (ZESTORETIC) 20-12.5 MG tablet Take 1 tablet by mouth once daily  . magnesium oxide (MAG-OX) 400 (241.3 MG) MG tablet Take 400 mg by mouth 2 (two) times daily. Am and pm  . metFORMIN (GLUCOPHAGE) 1000 MG tablet TAKE 1 TABLET BY MOUTH TWICE DAILY WITH MEALS  . metoprolol tartrate (LOPRESSOR) 25 MG tablet Take 1 tablet by mouth twice daily  . ONETOUCH ULTRA test strip USE TO CHECK BLOOD SUGAR TWICE DAILY  . pioglitazone (ACTOS) 45 MG tablet TAKE 1 TABLET BY MOUTH ONCE DAILY IN THE MORNING  . rosuvastatin (CRESTOR) 10 MG tablet Take 1 tablet by mouth once daily  . colchicine  0.6 MG tablet Take 1 tablet (0.6 mg total) by mouth daily. (Patient not taking: Reported on 03/16/2020)  . colchicine 0.6 MG tablet Take 1 tablet (0.6 mg total) by mouth daily. (Patient not taking: Reported on 11/11/2019)  . ferrous sulfate 325 (65 FE) MG tablet Take 325 mg by mouth daily. (Patient not taking: Reported on 11/11/2019)  . lovastatin (MEVACOR) 40 MG tablet TAKE 1 TABLET BY MOUTH AT BEDTIME (Patient not taking: No sig reported)  . naproxen (NAPROSYN) 500 MG tablet  Take 1 tablet (500 mg total) by mouth 2 (two) times daily with a meal. (Patient not taking: Reported on 11/11/2019)  . Omega-3 Fatty Acids (FISH OIL) 1000 MG CAPS Take 525 mg by mouth daily.  (Patient not taking: Reported on 03/16/2020)  . ondansetron (ZOFRAN) 4 MG tablet Take 1 tablet (4 mg total) by mouth every 8 (eight) hours as needed for nausea or vomiting. (Patient not taking: Reported on 07/10/2019)  . tiZANidine (ZANAFLEX) 2 MG tablet Take 2 mg by mouth 3 (three) times daily as needed. (Patient not taking: Reported on 11/11/2019)   No facility-administered medications prior to visit.    Review of Systems  Constitutional: Negative for appetite change, chills and fever.  Respiratory: Negative for chest tightness, shortness of breath and wheezing.   Cardiovascular: Negative for chest pain and palpitations.  Gastrointestinal: Negative for abdominal pain, nausea and vomiting.    Last hemoglobin A1c Lab Results  Component Value Date   HGBA1C 6.8 (A) 03/16/2020      Objective    BP (!) 144/65 (BP Location: Right Arm, Patient Position: Sitting, Cuff Size: Large)   Pulse 74   Temp 98.7 F (37.1 C) (Oral)   Resp 16   Ht 5\' 9"  (1.753 m)   Wt 207 lb (93.9 kg)   SpO2 99%   BMI 30.57 kg/m  BP Readings from Last 3 Encounters:  03/16/20 (!) 144/65  11/11/19 (!) 144/68  07/10/19 126/60   Wt Readings from Last 3 Encounters:  03/16/20 207 lb (93.9 kg)  11/11/19 206 lb (93.4 kg)  07/10/19 204 lb (92.5 kg)      Physical Exam   Depression screen [facility] 2/9 03/16/2020 07/10/2019 07/04/2018 07/04/2018 06/07/2017  Decreased Interest 0 0 1 1 0  Down, Depressed, Hopeless 0 0 0 0 0  PHQ - 2 Score 0 0 1 1 0  Altered sleeping 0 - 0 - 0  Tired, decreased energy 0 - 0 - 0  Change in appetite 0 - 0 - 0  Feeling bad or failure about yourself  0 - 0 - 0  Trouble concentrating 0 - 0 - 0  Moving slowly or fidgety/restless 0 - 0 - 0  Suicidal thoughts 0 - 0 - 0  PHQ-9 Score 0 - 1 - 0  Difficult doing  work/chores Not difficult at all - Not difficult at all - Not difficult at all   MMSE - [place] Exam 03/16/2020  Orientation to time 5  Orientation to Place 5  Registration 3  Attention/ Calculation 5  Recall 3  Language- name 2 objects 2  Language- repeat 1  Language- follow 3 step command 3  Language- read & follow direction 1  Write a sentence 1  Copy design 1  Total score 30    Results for orders placed or performed in visit on 03/16/20  POCT glycosylated hemoglobin (Hb A1C)  Result Value Ref Range   Hemoglobin A1C 6.8 (A) 4.0 - 5.6 %  Est. average glucose Bld gHb Est-mCnc 148     Assessment & Plan     1. DM (diabetes mellitus) with complications (HCC) I0X under good control at 6.8.  Continue Actos plus metformin. - POCT glycosylated hemoglobin (Hb A1C)  2. Essential hypertension Good control on amlodipine and lisinopril HCT and metoprolol  3. Arteriosclerosis of nonautologous coronary artery bypass graft All risk factors treated  4. Pure hypercholesterolemia On lovastatin 40  5. MCI (mild cognitive impairment) MMSE is 30/30 today.  Will follow clinically.  Offered neurology referral and they declined today.  Overall I think he is doing fine at this time.   No follow-ups on file.         Vonette Grosso Cranford Mon, MD  [facility] [phone] (phone) [phone] (fax)  [place]

## 2020-03-16 ENCOUNTER — Other Ambulatory Visit: Payer: Self-pay

## 2020-03-16 ENCOUNTER — Encounter: Payer: Self-pay | Admitting: Family Medicine

## 2020-03-16 ENCOUNTER — Other Ambulatory Visit: Payer: Self-pay | Admitting: Family Medicine

## 2020-03-16 ENCOUNTER — Ambulatory Visit (INDEPENDENT_AMBULATORY_CARE_PROVIDER_SITE_OTHER): Payer: Medicare Other | Admitting: Family Medicine

## 2020-03-16 VITALS — BP 144/65 | HR 74 | Temp 98.7°F | Resp 16 | Ht 69.0 in | Wt 207.0 lb

## 2020-03-16 DIAGNOSIS — I25810 Atherosclerosis of coronary artery bypass graft(s) without angina pectoris: Secondary | ICD-10-CM

## 2020-03-16 DIAGNOSIS — G3184 Mild cognitive impairment, so stated: Secondary | ICD-10-CM

## 2020-03-16 DIAGNOSIS — E118 Type 2 diabetes mellitus with unspecified complications: Secondary | ICD-10-CM

## 2020-03-16 DIAGNOSIS — I10 Essential (primary) hypertension: Secondary | ICD-10-CM

## 2020-03-16 DIAGNOSIS — E7800 Pure hypercholesterolemia, unspecified: Secondary | ICD-10-CM | POA: Diagnosis not present

## 2020-03-16 LAB — POCT GLYCOSYLATED HEMOGLOBIN (HGB A1C)
Est. average glucose Bld gHb Est-mCnc: 148
Hemoglobin A1C: 6.8 % — AB (ref 4.0–5.6)

## 2020-03-16 NOTE — Telephone Encounter (Signed)
Requested Prescriptions  Pending Prescriptions Disp Refills   furosemide (LASIX) 40 MG tablet [Pharmacy Med Name: Furosemide 40 MG Oral Tablet] 90 tablet 1    Sig: TAKE 1 TABLET BY MOUTH IN THE MORNING     Cardiovascular:  Diuretics - Loop Failed - 03/16/2020  2:19 PM      Failed - Last BP in normal range    BP Readings from Last 1 Encounters:  03/16/20 (!) 144/65         Passed - K in normal range and within 360 days    Potassium  Date Value Ref Range Status  07/10/2019 3.9 3.5 - 5.2 mmol/L Final  05/16/2013 3.6 3.5 - 5.1 mmol/L Final         Passed - Ca in normal range and within 360 days    Calcium  Date Value Ref Range Status  07/10/2019 10.0 8.6 - 10.2 mg/dL Final   Calcium, Total  Date Value Ref Range Status  03/27/2013 9.4 8.5 - 10.1 mg/dL Final         Passed - Na in normal range and within 360 days    Sodium  Date Value Ref Range Status  07/10/2019 140 134 - 144 mmol/L Final  03/27/2013 134 (L) 136 - 145 mmol/L Final         Passed - Cr in normal range and within 360 days    Creatinine  Date Value Ref Range Status  03/27/2013 1.26 0.60 - 1.30 mg/dL Final   Creatinine, Ser  Date Value Ref Range Status  07/10/2019 1.01 0.76 - 1.27 mg/dL Final         Passed - Valid encounter within last 6 months    Recent Outpatient Visits          Today DM (diabetes mellitus) with complications [facility])   [facility] Jerrol Banana., MD   4 months ago DM (diabetes mellitus) with complications [facility])   [facility] Jerrol Banana., MD   8 months ago Annual physical exam   [facility] Jerrol Banana., MD   1 year ago Type 2 diabetes mellitus without complication, without long-term current use of insulin [facility])   [facility] Jerrol Banana., MD   1 year ago Type 2 diabetes mellitus without complication, without long-term current use of insulin [facility])   [facility] Jerrol Banana., MD      Future Appointments            In 4 months Jerrol Banana., MD [facility] [place]

## 2020-04-19 ENCOUNTER — Other Ambulatory Visit: Payer: Self-pay | Admitting: Family Medicine

## 2020-04-19 NOTE — Telephone Encounter (Signed)
Requested Prescriptions  Pending Prescriptions Disp Refills  . metoprolol tartrate (LOPRESSOR) 25 MG tablet [Pharmacy Med Name: Metoprolol Tartrate 25 MG Oral Tablet] 180 tablet 0    Sig: Take 1 tablet by mouth twice daily     Cardiovascular:  Beta Blockers Failed - 04/19/2020  6:36 PM      Failed - Last BP in normal range    BP Readings from Last 1 Encounters:  03/16/20 (!) 144/65         Passed - Last Heart Rate in normal range    Pulse Readings from Last 1 Encounters:  03/16/20 74         Passed - Valid encounter within last 6 months    Recent Outpatient Visits          1 month ago DM (diabetes mellitus) with complications [facility])   [place] Jerrol Banana., MD   5 months ago DM (diabetes mellitus) with complications [facility])   [facility] Jerrol Banana., MD   9 months ago Annual physical exam   [facility] Jerrol Banana[facility] MD   1 year ago Type 2 diabetes mellitus without complication, without long-term current use of insulin [facility])   [facility] Jerrol Banana., MD   1 year ago Type 2 diabetes mellitus without complication, without long-term current use of insulin [facility])   [facility] Jerrol Banana., MD      Future Appointments            In 3 months Jerrol Banana., MD [facility]

## 2020-04-29 ENCOUNTER — Other Ambulatory Visit: Payer: Self-pay | Admitting: Family Medicine

## 2020-05-14 ENCOUNTER — Other Ambulatory Visit: Payer: Self-pay | Admitting: Family Medicine

## 2020-05-14 DIAGNOSIS — E119 Type 2 diabetes mellitus without complications: Secondary | ICD-10-CM

## 2020-05-21 IMAGING — CT CT HEAD W/O CM
3 series · 15 of 47 positions shown, 18 images · non-contrast
Comparison: None.

CLINICAL DATA: Head trauma secondary to a fall today. The patient
takes blood thinners.

EXAM:
CT HEAD WITHOUT CONTRAST
TECHNIQUE: Contiguous axial images were obtained from the base of the skull
through the vertex without intravenous contrast.

[Series 2: head wo · axial · 0.47mm/px · z∈[-152,-27]mm · 9 of 31 slices shown, 12 images]
[im 3/31  brain]
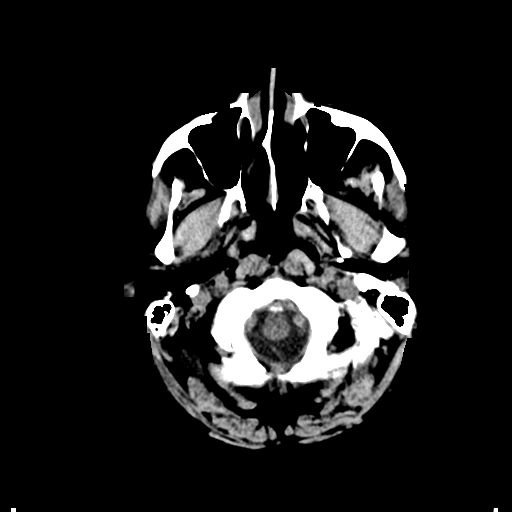
[im 3/31  bone]
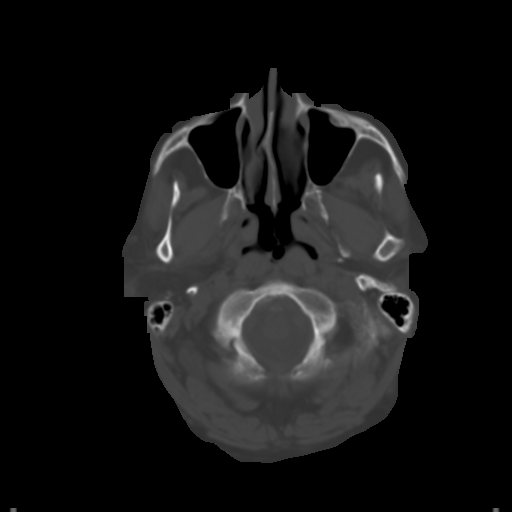
[im 6/31  brain]
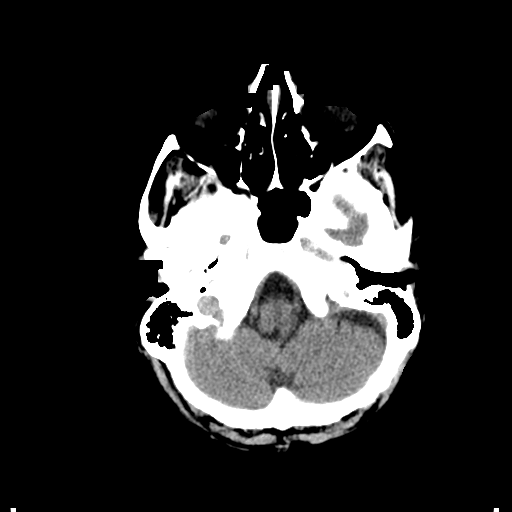
[im 9/31  brain]
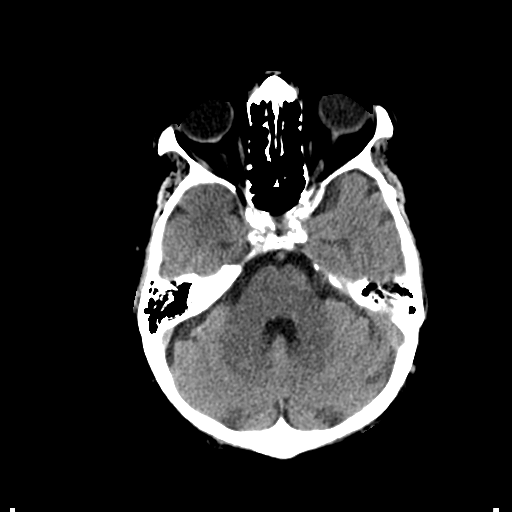
[im 12/31  brain]
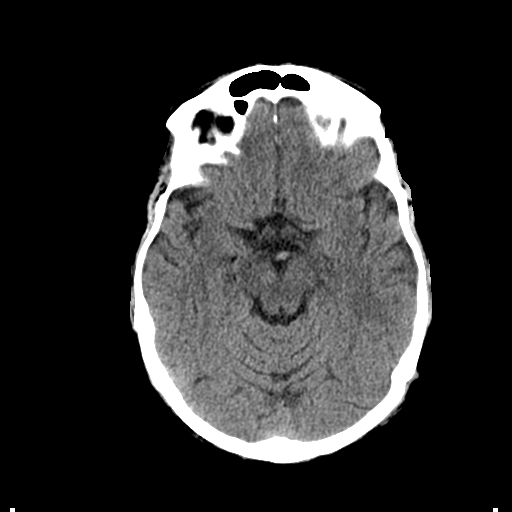
[im 16/31  brain]
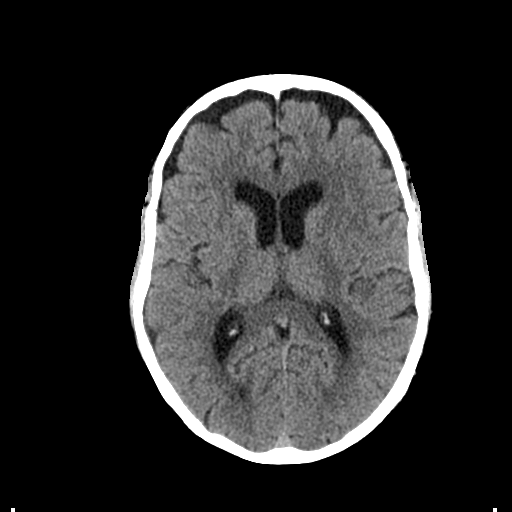
[im 16/31  bone]
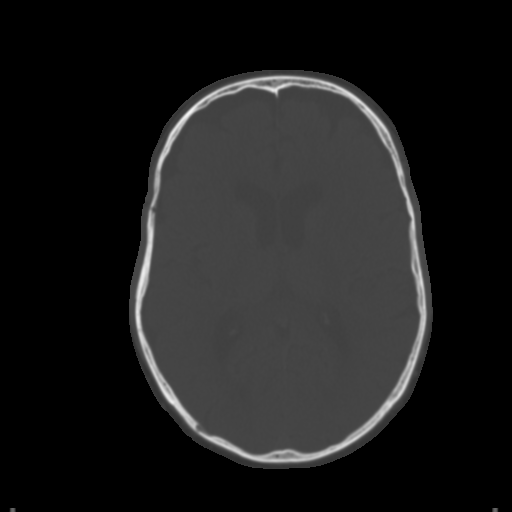
[im 19/31  brain]
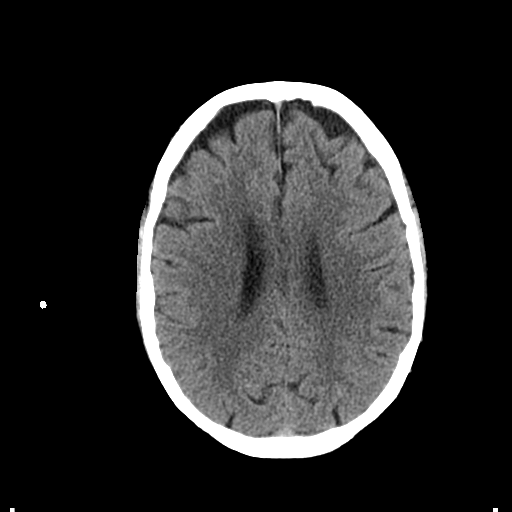
[im 22/31  brain]
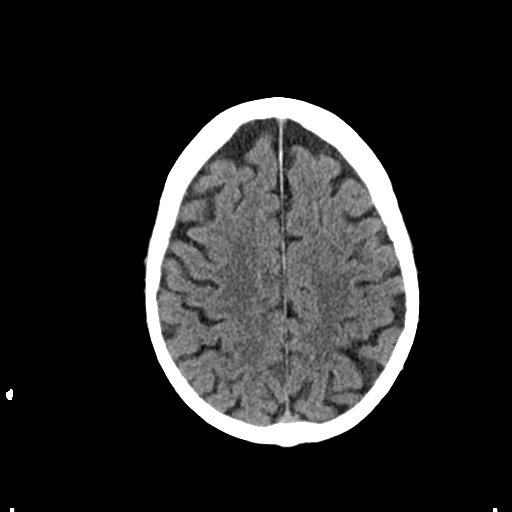
[im 25/31  brain]
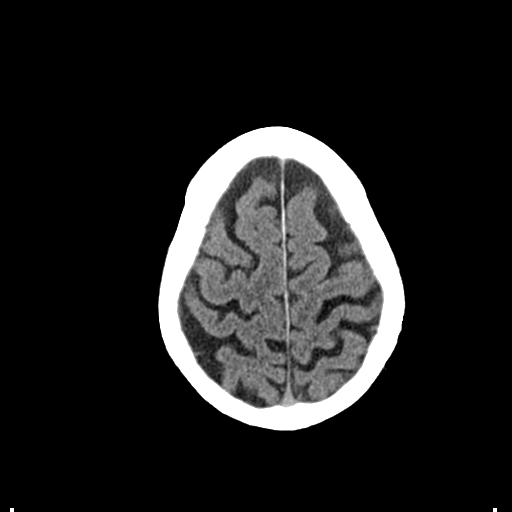
[im 28/31  brain]
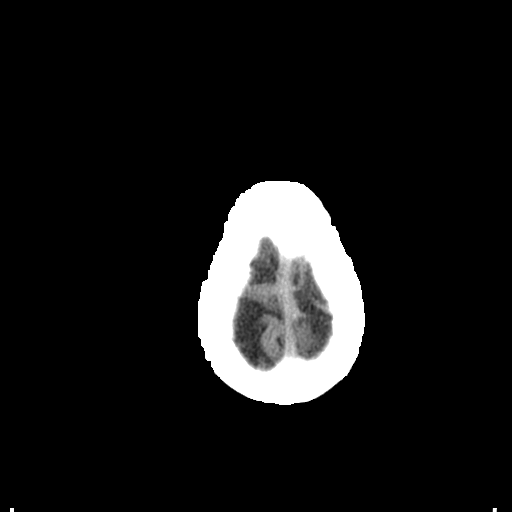
[im 28/31  bone]
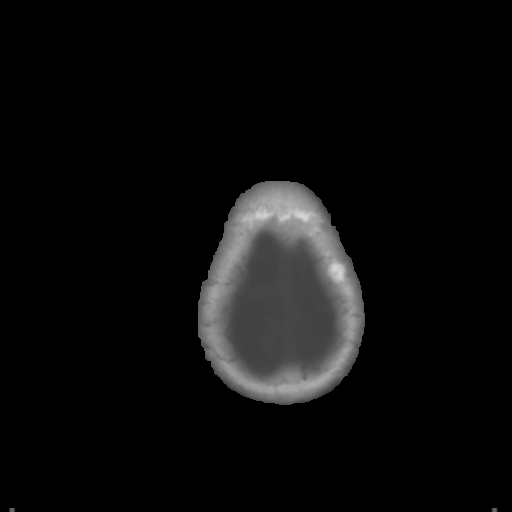

[Series 4: coronal soft tissue · coronal · 0.30mm/px · 3 of 71 slices shown]
[im 24/71  brain]
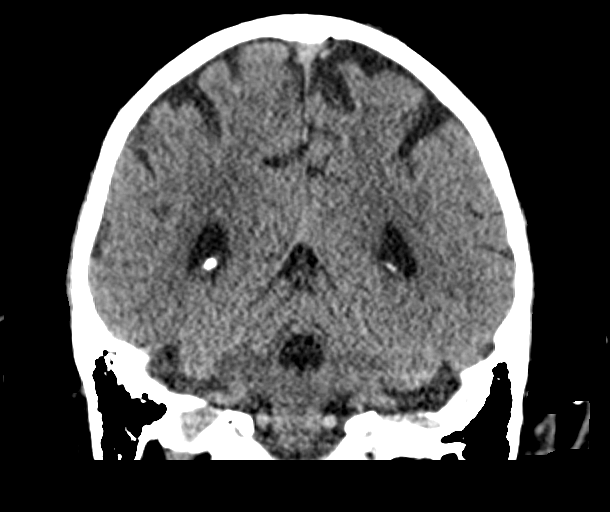
[im 32/71  brain]
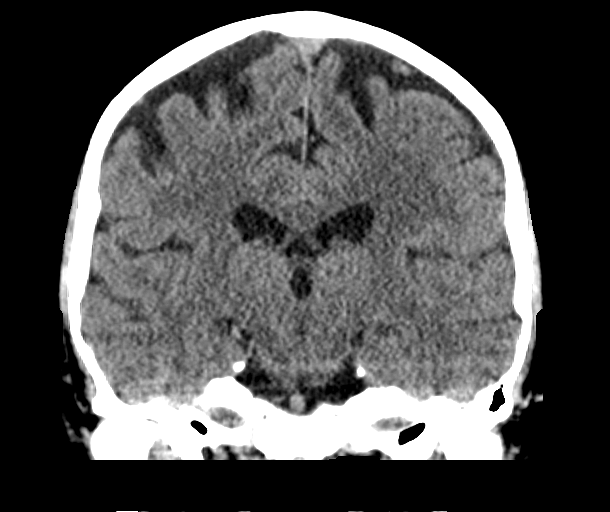
[im 39/71  brain]
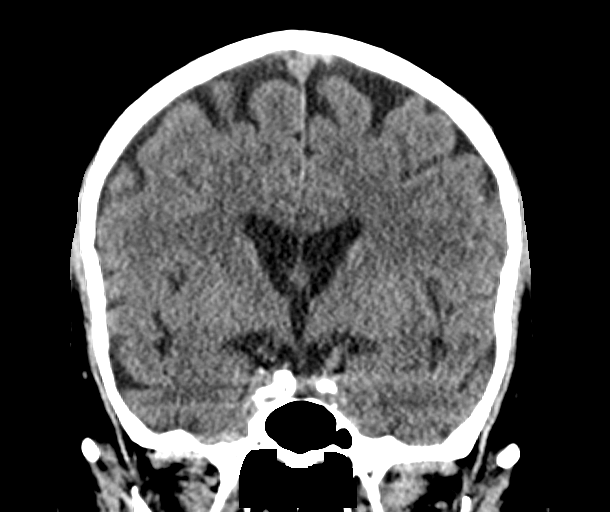

[Series 5: sagittal soft tissue · sagittal · 0.30mm/px · 3 of 61 slices shown]
[im 21/61  brain]
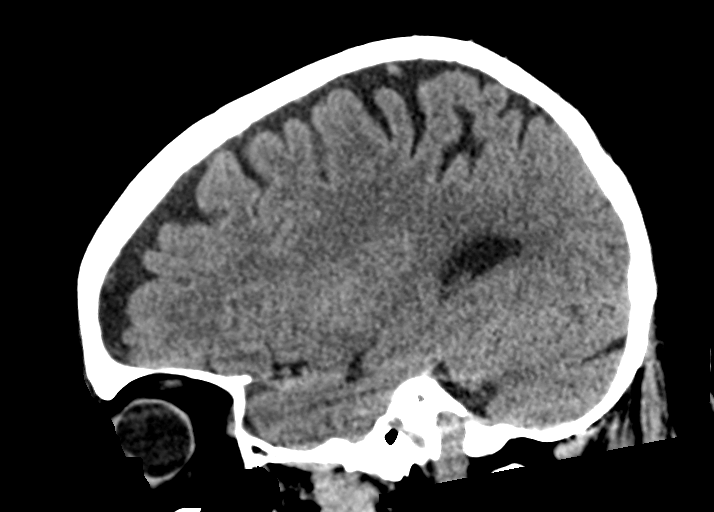
[im 31/61  brain]
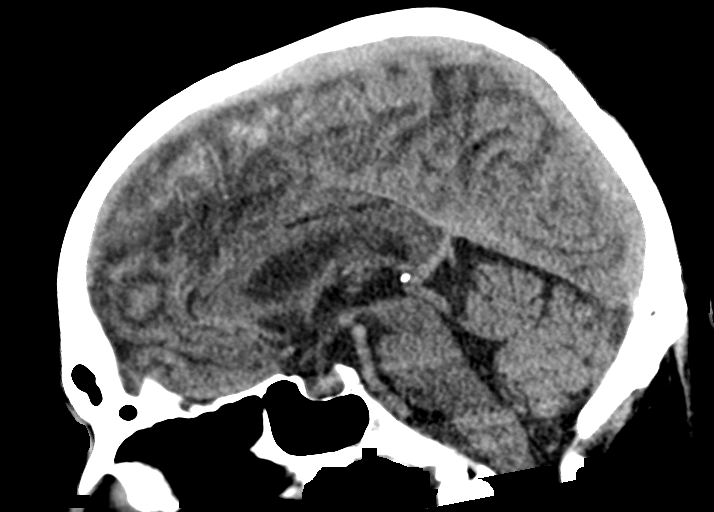
[im 41/61  brain]
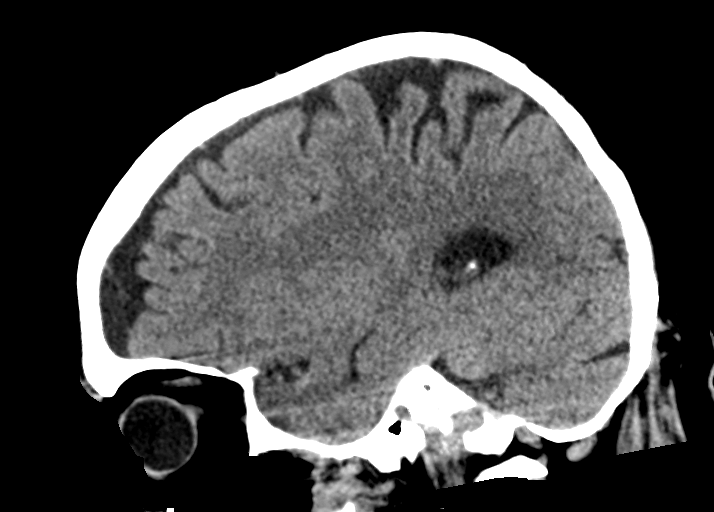

[15 of 47 positions shown; findings below may reference images not displayed]

FINDINGS: Brain: No evidence of acute infarction, hemorrhage, hydrocephalus,
extra-axial collection or mass lesion/mass effect. There is diffuse
mild cerebral cortical atrophy. There is a tiny area of lucency in
the anterior limb of the right internal capsule probably
representing a tiny old infarct.

Vascular: No hyperdense vessel or unexpected calcification.

Skull: Normal. Negative for fracture or focal lesion.

Sinuses/Orbits: No acute finding.

Other: None.
IMPRESSION: No acute abnormalities. Tiny old infarct in the anterior limb of the
right internal capsule.

Critical Value/emergent results were called by telephone at the time
of interpretation on 02/28/2019 at [DATE] to Jafet Egill Klapton , who
verbally acknowledged these results.

## 2020-06-08 ENCOUNTER — Other Ambulatory Visit: Payer: Self-pay | Admitting: Family Medicine

## 2020-06-17 DIAGNOSIS — E119 Type 2 diabetes mellitus without complications: Secondary | ICD-10-CM | POA: Diagnosis not present

## 2020-06-17 DIAGNOSIS — E785 Hyperlipidemia, unspecified: Secondary | ICD-10-CM | POA: Diagnosis not present

## 2020-06-17 DIAGNOSIS — I493 Ventricular premature depolarization: Secondary | ICD-10-CM | POA: Diagnosis not present

## 2020-06-17 DIAGNOSIS — I2510 Atherosclerotic heart disease of native coronary artery without angina pectoris: Secondary | ICD-10-CM | POA: Diagnosis not present

## 2020-06-17 DIAGNOSIS — I10 Essential (primary) hypertension: Secondary | ICD-10-CM | POA: Diagnosis not present

## 2020-06-17 DIAGNOSIS — I2119 ST elevation (STEMI) myocardial infarction involving other coronary artery of inferior wall: Secondary | ICD-10-CM | POA: Diagnosis not present

## 2020-06-17 DIAGNOSIS — I25810 Atherosclerosis of coronary artery bypass graft(s) without angina pectoris: Secondary | ICD-10-CM | POA: Diagnosis not present

## 2020-06-17 DIAGNOSIS — E7800 Pure hypercholesterolemia, unspecified: Secondary | ICD-10-CM | POA: Diagnosis not present

## 2020-07-03 ENCOUNTER — Other Ambulatory Visit: Payer: Self-pay | Admitting: Family Medicine

## 2020-07-03 DIAGNOSIS — E7800 Pure hypercholesterolemia, unspecified: Secondary | ICD-10-CM

## 2020-07-15 DIAGNOSIS — H26491 Other secondary cataract, right eye: Secondary | ICD-10-CM | POA: Diagnosis not present

## 2020-07-15 DIAGNOSIS — H35372 Puckering of macula, left eye: Secondary | ICD-10-CM | POA: Diagnosis not present

## 2020-07-15 LAB — HM DIABETES EYE EXAM

## 2020-07-19 ENCOUNTER — Other Ambulatory Visit: Payer: Self-pay | Admitting: Family Medicine

## 2020-07-19 DIAGNOSIS — I10 Essential (primary) hypertension: Secondary | ICD-10-CM

## 2020-07-19 NOTE — Telephone Encounter (Signed)
Requested Prescriptions  Pending Prescriptions Disp Refills  . metoprolol tartrate (LOPRESSOR) 25 MG tablet [Pharmacy Med Name: Metoprolol Tartrate 25 MG Oral Tablet] 180 tablet 0    Sig: Take 1 tablet by mouth twice daily     Cardiovascular:  Beta Blockers Failed - 07/19/2020  6:31 AM      Failed - Last BP in normal range    BP Readings from Last 1 Encounters:  03/16/20 (!) 144/65         Passed - Last Heart Rate in normal range    Pulse Readings from Last 1 Encounters:  03/16/20 74         Passed - Valid encounter within last 6 months    Recent Outpatient Visits          4 months ago DM (diabetes mellitus) with complications ([place])   [place] Jerrol Banana., MD   8 months ago DM (diabetes mellitus) with complications [facility])   [facility] Jer[facility] Ban[facility]., MD   1 year ago Annual physical exam   [facility] Jerrol Banana., MD   1 year ago Type 2 diabetes mellitus without complication, without long-term current use of insulin [facility])   [facility] Jer[facility] Banana., MD   1 year ago Type 2 diabetes mellitus without complication, without long-term current use of insulin [facility])   [facility] Jerrol Banana., MD      Future Appointments            In 2 months Jerrol Banana., MD [facility]           . amLODipine (NORVASC) 5 MG tablet [Pharmacy Med Name: amLODIPine Besylate 5 MG Oral Tablet] 90 tablet 0    Sig: Take 1 tablet by mouth once daily     Cardiovascular:  Calcium Channel Blockers Failed - 07/19/2020  6:31 AM      Failed - Last BP in normal range    BP Readings from Last 1 Encounters:  03/16/20 (!) 144/65         Passed - Valid encounter within last 6 months    Recent Outpatient Visits          4 months ago DM (diabetes mellitus) with complications [facility])   [place] Jerrol Banana., MD   8 months ago DM  (diabetes mellitus) with complications [facility])   [facility] Jerrol Banana., MD   1 year ago Annual physical exam   [facility] Jerrol Banana., MD   1 year ago Type 2 diabetes mellitus without complication, without long-term current use of insulin [facility]   [facility] Jerrol Banana., MD   1 year ago Type 2 diabetes mellitus without complication, without long-term current use of insulin [facility])   [facility] Jerrol Banana., MD      Future Appointments            In 2 months Jerrol Banana., MD [facility]

## 2020-07-28 ENCOUNTER — Other Ambulatory Visit: Payer: Self-pay | Admitting: Family Medicine

## 2020-07-28 NOTE — Telephone Encounter (Signed)
Requested Prescriptions  Pending Prescriptions Disp Refills  . metFORMIN (GLUCOPHAGE) 1000 MG tablet [Pharmacy Med Name: metFORMIN HCl 1000 MG Oral Tablet] 180 tablet 0    Sig: TAKE 1 TABLET BY MOUTH TWICE DAILY WITH MEALS     Endocrinology:  Diabetes - Biguanides Failed - 07/28/2020  6:57 PM      Failed - Cr in normal range and within 360 days    Creatinine  Date Value Ref Range Status  03/27/2013 1.26 0.60 - 1.30 mg/dL Final   Creatinine, Ser  Date Value Ref Range Status  07/10/2019 1.01 0.76 - 1.27 mg/dL Final         Failed - eGFR in normal range and within 360 days    EGFR (African American)  Date Value Ref Range Status  03/27/2013 >60  Final   GFR calc Af Amer  Date Value Ref Range Status  07/10/2019 83 >59 mL/min/1.73 Final   EGFR (Non-African Amer.)  Date Value Ref Range Status  03/27/2013 57 (L)  Final    Comment:    eGFR values <47mL/min/1.73 m2 may be an indication of chronic kidney disease (CKD). Calculated eGFR is useful in patients with stable renal function. The eGFR calculation will not be reliable in acutely ill patients when serum creatinine is changing rapidly. It is not useful in  patients on dialysis. The eGFR calculation may not be applicable to patients at the low and high extremes of body sizes, pregnant women, and vegetarians.    GFR calc non Af Amer  Date Value Ref Range Status  07/10/2019 71 >59 mL/min/1.73 Final         Passed - HBA1C is between 0 and 7.9 and within 180 days    Hemoglobin A1C  Date Value Ref Range Status  03/16/2020 6.8 (A) 4.0 - 5.6 % Final   Hgb A1c MFr Bld  Date Value Ref Range Status  06/13/2017 6.4 (H) 4.8 - 5.6 % Final    Comment:             Prediabetes: 5.7 - 6.4          Diabetes: >6.4          Glycemic control for adults with diabetes: <7.0          Passed - Valid encounter within last 6 months    Recent Outpatient Visits          4 months ago DM (diabetes mellitus) with complications [facility])    [facility] Jerrol Banana., MD   8 months ago DM (diabetes mellitus) with complications [facility])   [facility] Jerrol Banana., MD   1 year ago Annual physical exam   [facility] Jerrol Ban[facility]., MD   1 year ago Type 2 diabetes mellitus without complication, without long-term current use of insulin [facility])   [facility] Jerrol Banana., MD   1 year ago Type 2 diabetes mellitus without complication, without long-term current use of insulin [facility])   [facility] Jerrol Banana., MD      Future Appointments            In 2 months Jerrol Banana., MD [facility]

## 2020-07-29 ENCOUNTER — Encounter: Payer: Medicare Other | Admitting: Family Medicine

## 2020-08-03 ENCOUNTER — Other Ambulatory Visit: Payer: Self-pay | Admitting: Family Medicine

## 2020-08-03 DIAGNOSIS — M1A9XX0 Chronic gout, unspecified, without tophus (tophi): Secondary | ICD-10-CM

## 2020-08-13 ENCOUNTER — Other Ambulatory Visit: Payer: Self-pay | Admitting: Family Medicine

## 2020-08-13 DIAGNOSIS — E119 Type 2 diabetes mellitus without complications: Secondary | ICD-10-CM

## 2020-08-13 NOTE — Telephone Encounter (Signed)
Requested Prescriptions  Pending Prescriptions Disp Refills  . pioglitazone (ACTOS) 45 MG tablet [Pharmacy Med Name: Pioglitazone HCl 45 MG Oral Tablet] 90 tablet 0    Sig: TAKE 1 TABLET BY MOUTH ONCE DAILY IN THE MORNING     Endocrinology:  Diabetes - Glitazones - pioglitazone Passed - 08/13/2020  5:33 AM      Passed - HBA1C is between 0 and 7.9 and within 180 days    Hemoglobin A1C  Date Value Ref Range Status  03/16/2020 6.8 (A) 4.0 - 5.6 % Final   Hgb A1c MFr Bld  Date Value Ref Range Status  06/13/2017 6.4 (H) 4.8 - 5.6 % Final    Comment:             Prediabetes: 5.7 - 6.4          Diabetes: >6.4          Glycemic control for adults with diabetes: <7.0          Passed - Valid encounter within last 6 months    Recent Outpatient Visits          5 months ago DM (diabetes mellitus) with complications [facility])    [facility] Jerrol Banana., MD   9 months ago DM (diabetes mellitus) with complications [facility])   [facility] Jer[facility] Banana., MD   1 year ago Annual physical exam   [facility] Jerrol Banana., MD   1 year ago Type 2 diabetes mellitus without complication, without long-term current use of insulin [facility])   [facility] Jerrol Banana., MD   1 year ago Type 2 diabetes mellitus without complication, without long-term current use of insulin [facility])   [facility] Jerrol Banana., MD      Future Appointments            In 1 month Jerrol Banana., MD [facility]

## 2020-08-26 ENCOUNTER — Other Ambulatory Visit: Payer: Self-pay | Admitting: Family Medicine

## 2020-09-01 ENCOUNTER — Other Ambulatory Visit: Payer: Self-pay | Admitting: Family Medicine

## 2020-09-01 DIAGNOSIS — I10 Essential (primary) hypertension: Secondary | ICD-10-CM

## 2020-09-01 NOTE — Telephone Encounter (Signed)
Requested Prescriptions  Pending Prescriptions Disp Refills  . furosemide (LASIX) 40 MG tablet [Pharmacy Med Name: Furosemide 40 MG Oral Tablet] 90 tablet 0    Sig: TAKE 1 TABLET BY MOUTH IN THE MORNING     Cardiovascular:  Diuretics - Loop Failed - 09/01/2020  6:38 PM      Failed - K in normal range and within 360 days    Potassium  Date Value Ref Range Status  07/10/2019 3.9 3.5 - 5.2 mmol/L Final  05/16/2013 3.6 3.5 - 5.1 mmol/L Final         Failed - Ca in normal range and within 360 days    Calcium  Date Value Ref Range Status  07/10/2019 10.0 8.6 - 10.2 mg/dL Final   Calcium, Total  Date Value Ref Range Status  03/27/2013 9.4 8.5 - 10.1 mg/dL Final         Failed - Na in normal range and within 360 days    Sodium  Date Value Ref Range Status  07/10/2019 140 134 - 144 mmol/L Final  03/27/2013 134 (L) 136 - 145 mmol/L Final         Failed - Cr in normal range and within 360 days    Creatinine  Date Value Ref Range Status  03/27/2013 1.26 0.60 - 1.30 mg/dL Final   Creatinine, Ser  Date Value Ref Range Status  07/10/2019 1.01 0.76 - 1.27 mg/dL Final         Failed - Last BP in normal range    BP Readings from Last 1 Encounters:  03/16/20 (!) 144/65         Passed - Valid encounter within last 6 months    Recent Outpatient Visits          5 months ago DM (diabetes mellitus) with complications [facility])   [place] Jerrol Banana., MD   9 months ago DM (diabetes mellitus) with complications [facility])   [facility] Jerrol Banana., MD   1 year ago Annual physical exam   [facility] Jerrol Banana., MD   1 year ago Type 2 diabetes mellitus without complication, without long-term current use of insulin [facility])   [facility] Jerrol Banana., MD   1 year ago Type 2 diabetes mellitus without complication, without long-term current use of insulin [facility])   [facility] Jerrol Banana., MD      Future Appointments            In 1 month Jerrol Banana., MD [facility] [place]

## 2020-10-08 ENCOUNTER — Other Ambulatory Visit: Payer: Self-pay

## 2020-10-08 ENCOUNTER — Ambulatory Visit (INDEPENDENT_AMBULATORY_CARE_PROVIDER_SITE_OTHER): Payer: Medicare Other | Admitting: Family Medicine

## 2020-10-08 VITALS — BP 117/69 | HR 68 | Temp 97.9°F | Resp 16 | Ht 68.0 in | Wt 207.0 lb

## 2020-10-08 DIAGNOSIS — I10 Essential (primary) hypertension: Secondary | ICD-10-CM

## 2020-10-08 DIAGNOSIS — M109 Gout, unspecified: Secondary | ICD-10-CM

## 2020-10-08 DIAGNOSIS — E7800 Pure hypercholesterolemia, unspecified: Secondary | ICD-10-CM | POA: Diagnosis not present

## 2020-10-08 DIAGNOSIS — I25810 Atherosclerosis of coronary artery bypass graft(s) without angina pectoris: Secondary | ICD-10-CM | POA: Diagnosis not present

## 2020-10-08 DIAGNOSIS — Z0000 Encounter for general adult medical examination without abnormal findings: Secondary | ICD-10-CM

## 2020-10-08 DIAGNOSIS — E118 Type 2 diabetes mellitus with unspecified complications: Secondary | ICD-10-CM | POA: Diagnosis not present

## 2020-10-08 NOTE — Progress Notes (Signed)
Annual Wellness Visit     Patient: Todd Olson, Male    DOB: 02/13/1941, 80 y.o.   MRN: [id number] Visit Date: 10/08/2020  Today's Provider: [name], MD   Chief Complaint  Patient presents with   Annual Exam   Subjective    Todd Olson is a 80 y.o. male who presents today for his Annual Wellness Visit./CPE. He reports consuming a general diet. Home exercise routine includes walking 1 hrs per day. He generally feels well. He reports sleeping well. He does have additional problems to discuss today.   He feels well and has no complaints.     Medications: Outpatient Medications Prior to Visit  Medication Sig   allopurinol (ZYLOPRIM) 100 MG tablet Take 1 tablet by mouth once daily   amLODipine (NORVASC) 5 MG tablet Take 1 tablet by mouth once daily   aspirin 81 MG tablet Take 81 mg by mouth every other day. At night   Cholecalciferol 1000 UNITS capsule Take 1,000 Units by mouth daily. am   clopidogrel (PLAVIX) 75 MG tablet Take 1 tablet by mouth once daily   furosemide (LASIX) 40 MG tablet TAKE 1 TABLET BY MOUTH IN THE MORNING   Glucosamine-Chondroit-Vit C-Mn (GLUCOSAMINE CHONDR 1500 COMPLX) CAPS Take 1 capsule by mouth 2 (two) times daily.    lisinopril-hydrochlorothiazide (ZESTORETIC) 20-12.5 MG tablet Take 1 tablet by mouth once daily   magnesium oxide (MAG-OX) 400 (241.3 MG) MG tablet Take 400 mg by mouth 2 (two) times daily. Am and pm   metFORMIN (GLUCOPHAGE) 1000 MG tablet TAKE 1 TABLET BY MOUTH TWICE DAILY WITH MEALS   metoprolol tartrate (LOPRESSOR) 25 MG tablet Take 1 tablet by mouth twice daily   ONETOUCH ULTRA test strip USE TO CHECK BLOOD SUGAR TWICE DAILY   pioglitazone (ACTOS) 45 MG tablet TAKE 1 TABLET BY MOUTH ONCE DAILY IN THE MORNING   colchicine 0.6 MG tablet Take 1 tablet (0.6 mg total) by mouth daily. (Patient not taking: No sig reported)   colchicine 0.6 MG tablet Take 1 tablet (0.6 mg total) by mouth daily. (Patient not taking: No sig  reported)   ferrous sulfate 325 (65 FE) MG tablet Take 325 mg by mouth daily. (Patient not taking: No sig reported)   lovastatin (MEVACOR) 40 MG tablet TAKE 1 TABLET BY MOUTH AT BEDTIME (Patient not taking: No sig reported)   naproxen (NAPROSYN) 500 MG tablet Take 1 tablet (500 mg total) by mouth 2 (two) times daily with a meal. (Patient not taking: No sig reported)   Omega-3 Fatty Acids (FISH OIL) 1000 MG CAPS Take 525 mg by mouth daily.  (Patient not taking: No sig reported)   ondansetron (ZOFRAN) 4 MG tablet Take 1 tablet (4 mg total) by mouth every 8 (eight) hours as needed for nausea or vomiting. (Patient not taking: No sig reported)   rosuvastatin (CRESTOR) 10 MG tablet Take 1 tablet by mouth once daily   tiZANidine (ZANAFLEX) 2 MG tablet Take 2 mg by mouth 3 (three) times daily as needed. (Patient not taking: Reported on 11/11/2019)   No facility-administered medications prior to visit.    Allergies  Allergen Reactions   Oxycodone Other (See Comments)    hallucination   Penicillins Rash    Rash as a child.  Has not taken since then Has patient had a PCN reaction causing immediate rash, facial/tongue/throat swelling, SOB or lightheadedness with hypotension: Yes Has patient had a PCN reaction causing severe rash involving mucus membranes  or skin necrosis: No Has patient had a PCN reaction that required hospitalization: No Has patient had a PCN reaction occurring within the last 10 years: No If all of the above answers are "NO", then may proceed with Cephalosporin use.     Patient Care Team: Jerrol Banana., MD as PCP - General (Family Medicine) Isaias Cowman, MD as Consulting Physician (Cardiology) Birder Robson, MD as Referring Physician (Ophthalmology) Throat, [place] Ear Nose And  Review of Systems  All other systems reviewed and are negative.       Objective    Vitals: BP 117/69   Pulse 68   Temp 97.9 F (36.6 C)   Resp 16   Ht 5\' 8"  (1.727 m)    Wt 207 lb (93.9 kg)   BMI 31.47 kg/m  BP Readings from Last 3 Encounters:  10/08/20 117/69  03/16/20 (!) 144/65  11/11/19 (!) 144/68   Wt Readings from Last 3 Encounters:  10/08/20 207 lb (93.9 kg)  03/16/20 207 lb (93.9 kg)  11/11/19 206 lb (93.4 kg)      Physical Exam Vitals reviewed.  Constitutional:      Appearance: Normal appearance. He is well-developed and normal weight.  HENT:     Head: Normocephalic and atraumatic.     Right Ear: Tympanic membrane, ear canal and external ear normal.     Left Ear: Tympanic membrane, ear canal and external ear normal.     Nose: Nose normal.     Mouth/Throat:     Mouth: Mucous membranes are moist.     Pharynx: Oropharynx is clear.  Eyes:     General: No scleral icterus.    Extraocular Movements: Extraocular movements intact.     Conjunctiva/sclera: Conjunctivae normal.     Pupils: Pupils are equal, round, and reactive to light.  Neck:     Thyroid: No thyromegaly.     Vascular: No carotid bruit.  Cardiovascular:     Rate and Rhythm: Normal rate and regular rhythm.     Pulses: Normal pulses.     Heart sounds: Normal heart sounds.  Pulmonary:     Effort: Pulmonary effort is normal.     Breath sounds: Normal breath sounds.  Abdominal:     General: Abdomen is flat. Bowel sounds are normal.     Palpations: Abdomen is soft.  Musculoskeletal:     Cervical back: Normal range of motion and neck supple.  Lymphadenopathy:     Cervical: No cervical adenopathy.  Skin:    General: Skin is warm and dry.  Neurological:     General: No focal deficit present.     Mental Status: He is alert and oriented to person, place, and time.  Psychiatric:        Mood and Affect: Mood normal.        Behavior: Behavior normal.        Thought Content: Thought content normal.        Judgment: Judgment normal.     Most recent functional status assessment: In your present state of health, do you have any difficulty performing the following activities:  03/16/2020  Hearing? N  Vision? N  Difficulty concentrating or making decisions? Y  Walking or climbing stairs? Y  Dressing or bathing? N  Doing errands, shopping? N  Some recent data might be hidden   Most recent fall risk assessment: Fall Risk  03/16/2020  Falls in the past year? 0  Number falls in past yr: 0  Injury with  Fall? 0  Follow up Falls evaluation completed    Most recent depression screenings: PHQ 2/9 Scores 03/16/2020 07/10/2019  PHQ - 2 Score 0 0  PHQ- 9 Score 0 -   Most recent cognitive screening: 6CIT Screen 07/04/2018  What Year? 0 points  What month? 0 points  What time? 0 points  Count back from 20 0 points  Months in reverse 0 points  Repeat phrase 0 points  Total Score 0   Most recent Audit-C alcohol use screening Alcohol Use Disorder Test (AUDIT) 03/16/2020  1. How often do you have a drink containing alcohol? 0  2. How many drinks containing alcohol do you have on a typical day when you are drinking? 0  3. How often do you have six or more drinks on one occasion? 0  AUDIT-C Score 0  Alcohol Brief Interventions/Follow-up AUDIT Score <7 follow-up not indicated   A score of 3 or more in women, and 4 or more in men indicates increased risk for alcohol abuse, EXCEPT if all of the points are from question 1   No results found for any visits on 10/08/20.  Assessment & Plan     Annual wellness visit done today including the all of the following: Reviewed patient's Family Medical History Reviewed and updated list of patient's medical providers Assessment of cognitive impairment was done Assessed patient's functional ability Established a written schedule for health screening [place] Completed and Reviewed  Exercise Activities and Dietary recommendations  Goals      Exercise 3x per week (30 min per time)     Recommend to start exercising for 3 days a week for at least 30 minutes.          Immunization History   Administered Date(s) Administered   Fluad Quad(high Dose 65+) 01/23/2019, 02/19/2020   Influenza, High Dose Seasonal PF 01/26/2015, 02/01/2016, 02/06/2017, 01/03/2018   PFIZER(Purple Top)SARS-COV-2 Vaccination 05/08/2019, 06/01/2019, 01/28/2020   Pneumococcal Conjugate-13 11/14/2013   Pneumococcal Polysaccharide-23 10/17/2011, 07/08/2014   Td 05/31/2016    Health Maintenance  Topic Date Due   Hepatitis C Screening  Never done   Zoster Vaccines- Shingrix (1 of 2) Never done   FOOT EXAM  05/31/2017   COVID-19 Vaccine (4 - Booster for Pfizer series) 05/29/2020   HEMOGLOBIN A1C  09/13/2020   INFLUENZA VACCINE  11/30/2020   OPHTHALMOLOGY EXAM  07/15/2021   TETANUS/TDAP  05/31/2026   PNA vac Low Risk Adult  Completed   Pneumococcal Vaccine 83-59 Years old  Aged Out   HPV VACCINES  Aged Out     Discussed health benefits of physical activity, and encouraged him to engage in regular exercise appropriate for his age and condition.    1. Encounter for annual wellness visit (AWV) in Medicare patient   2. Annual physical exam Follow-up 1 year  3. DM (diabetes mellitus) with complications ([place]) Follow-up 4 to 6 months  controlled diabetes - Hemoglobin A1c  4. Arteriosclerosis of nonautologous coronary artery bypass graft All risk factors treated  5. Pure hypercholesterolemia  - CBC with Differential/Platelet - Comprehensive metabolic panel - Lipid panel - TSH  6. Primary hypertension  - CBC with Differential/Platelet - Comprehensive metabolic panel - TSH  7. Gout, unspecified cause, unspecified chronicity, unspecified site Check uric acid - Uric acid   No follow-ups on file.     I, [name], MD, have reviewed all documentation for this visit. The documentation on 10/12/20 for the exam, diagnosis, procedures, and orders  are all accurate and complete.    Midge Momon Cranford Mon, MD  [facility] [phone] (phone) [phone] (fax)  [place]

## 2020-10-09 LAB — COMPREHENSIVE METABOLIC PANEL
ALT: 18 IU/L (ref 0–44)
AST: 17 IU/L (ref 0–40)
Albumin/Globulin Ratio: 2 (ref 1.2–2.2)
Albumin: 4.5 g/dL (ref 3.7–4.7)
Alkaline Phosphatase: 62 IU/L (ref 44–121)
BUN/Creatinine Ratio: 18 (ref 10–24)
BUN: 24 mg/dL (ref 8–27)
Bilirubin Total: 0.7 mg/dL (ref 0.0–1.2)
CO2: 24 mmol/L (ref 20–29)
Calcium: 9.8 mg/dL (ref 8.6–10.2)
Chloride: 100 mmol/L (ref 96–106)
Creatinine, Ser: 1.37 mg/dL — ABNORMAL HIGH (ref 0.76–1.27)
Globulin, Total: 2.3 g/dL (ref 1.5–4.5)
Glucose: 125 mg/dL — ABNORMAL HIGH (ref 65–99)
Potassium: 4.1 mmol/L (ref 3.5–5.2)
Sodium: 139 mmol/L (ref 134–144)
Total Protein: 6.8 g/dL (ref 6.0–8.5)
eGFR: 53 mL/min/{1.73_m2} — ABNORMAL LOW (ref >59)

## 2020-10-09 LAB — CBC WITH DIFFERENTIAL/PLATELET
Basophils Absolute: 0 10*3/uL (ref 0.0–0.2)
Basos: 0 %
EOS (ABSOLUTE): 0.2 10*3/uL (ref 0.0–0.4)
Eos: 3 %
Hematocrit: 42.6 % (ref 37.5–51.0)
Hemoglobin: 14 g/dL (ref 13.0–17.7)
Immature Grans (Abs): 0 10*3/uL (ref 0.0–0.1)
Immature Granulocytes: 0 %
Lymphocytes Absolute: 1.1 10*3/uL (ref 0.7–3.1)
Lymphs: 19 %
MCH: 30.5 pg (ref 26.6–33.0)
MCHC: 32.9 g/dL (ref 31.5–35.7)
MCV: 93 fL (ref 79–97)
Monocytes Absolute: 0.4 10*3/uL (ref 0.1–0.9)
Monocytes: 7 %
Neutrophils Absolute: 4.1 10*3/uL (ref 1.4–7.0)
Neutrophils: 71 %
Platelets: 182 10*3/uL (ref 150–450)
RBC: 4.59 x10E6/uL (ref 4.14–5.80)
RDW: 13.9 % (ref 11.6–15.4)
WBC: 5.7 10*3/uL (ref 3.4–10.8)

## 2020-10-09 LAB — LIPID PANEL
Chol/HDL Ratio: 2.5 ratio (ref 0.0–5.0)
Cholesterol, Total: 132 mg/dL (ref 100–199)
HDL: 53 mg/dL (ref >39)
LDL Chol Calc (NIH): 51 mg/dL (ref 0–99)
Triglycerides: 166 mg/dL — ABNORMAL HIGH (ref 0–149)
VLDL Cholesterol Cal: 28 mg/dL (ref 5–40)

## 2020-10-09 LAB — HEMOGLOBIN A1C
Est. average glucose Bld gHb Est-mCnc: 137 mg/dL
Hgb A1c MFr Bld: 6.4 % — ABNORMAL HIGH (ref 4.8–5.6)

## 2020-10-09 LAB — TSH: TSH: 1.1 u[IU]/mL (ref 0.450–4.500)

## 2020-10-09 LAB — URIC ACID: Uric Acid: 6.8 mg/dL (ref 3.8–8.4)

## 2020-10-14 ENCOUNTER — Telehealth: Payer: Self-pay

## 2020-10-14 NOTE — Telephone Encounter (Signed)
Can you sign off on his results please Thanks   Copied from [place] [id number]. Topic: General - Other >> Oct 14, 2020  8:24 AM Alanda Slim E wrote: Reason for CRM: Pt called to speak with someone to go over his lab results / please advise

## 2020-10-15 ENCOUNTER — Telehealth: Payer: Self-pay

## 2020-10-15 NOTE — Telephone Encounter (Signed)
Copied from [place] [id number]. Topic: General - Other >> Oct 14, 2020  8:24 AM Alanda Slim E wrote: Reason for CRM: Pt called to speak with someone to go over his lab results / please advise >> Oct 15, 2020  9:34 AM Keene Breath wrote: Patient is calling again to get the results of his labs.  Please call patient to discuss.

## 2020-10-15 NOTE — Telephone Encounter (Signed)
Patient was notified of results. Expressed understanding.  

## 2020-10-19 ENCOUNTER — Other Ambulatory Visit: Payer: Self-pay | Admitting: Family Medicine

## 2020-10-19 DIAGNOSIS — I10 Essential (primary) hypertension: Secondary | ICD-10-CM

## 2020-10-19 NOTE — Telephone Encounter (Signed)
Requested Prescriptions  Pending Prescriptions Disp Refills  . amLODipine (NORVASC) 5 MG tablet [Pharmacy Med Name: amLODIPine Besylate 5 MG Oral Tablet] 90 tablet 1    Sig: Take 1 tablet by mouth once daily     Cardiovascular:  Calcium Channel Blockers Passed - 10/19/2020  6:42 PM      Passed - Last BP in normal range    BP Readings from Last 1 Encounters:  10/08/20 117/69         Passed - Valid encounter within last 6 months    Recent Outpatient Visits          1 week ago Encounter for annual wellness visit (AWV) in Medicare patient   [facility] Rosanna Randy, Retia Passe., MD   7 months ago DM (diabetes mellitus) with complications [facility])   [facility] Jerrol Banana., MD   11 months ago DM (diabetes mellitus) with complications [facility])   [facility] Jerrol Banana., MD   1 year ago Annual physical exam   [facility] Jerrol Banana., MD   1 year ago Type 2 diabetes mellitus without complication, without long-term current use of insulin [facility])   [facility] Jerrol Banana., MD      Future Appointments            In 5 months Jerrol Banana., MD [facility]           . metoprolol tartrate (LOPRESSOR) 25 MG tablet [Pharmacy Med Name: Metoprolol Tartrate 25 MG Oral Tablet] 180 tablet 1    Sig: Take 1 tablet by mouth twice daily     Cardiovascular:  Beta Blockers Passed - 10/19/2020  6:42 PM      Passed - Last BP in normal range    BP Readings from Last 1 Encounters:  10/08/20 117/69         Passed - Last Heart Rate in normal range    Pulse Readings from Last 1 Encounters:  10/08/20 68         Passed - Valid encounter within last 6 months    Recent Outpatient Visits          1 week ago Encounter for annual wellness visit (AWV) in Medicare patient   [facility] Rosanna Randy, Retia Passe., MD   7 months ago DM (diabetes mellitus) with  complications [facility])   [facility] Jerrol Banana., MD   11 months ago DM (diabetes mellitus) with complications [facility])   [facility] Jerrol Banana[facility] MD   1 year ago Annual physical exam   [facility] Jerrol Banana., MD   1 year ago Type 2 diabetes mellitus without complication, without long-term current use of insulin [facility])   [facility] Jerrol Banana., MD      Future Appointments            In 5 months Jerrol Banana., MD [facility]

## 2020-10-30 ENCOUNTER — Other Ambulatory Visit: Payer: Self-pay | Admitting: Family Medicine

## 2020-10-30 DIAGNOSIS — M1A9XX0 Chronic gout, unspecified, without tophus (tophi): Secondary | ICD-10-CM

## 2020-11-13 ENCOUNTER — Other Ambulatory Visit: Payer: Self-pay | Admitting: Family Medicine

## 2020-11-13 DIAGNOSIS — E119 Type 2 diabetes mellitus without complications: Secondary | ICD-10-CM

## 2020-11-14 NOTE — Telephone Encounter (Signed)
Requested Prescriptions  Pending Prescriptions Disp Refills  . pioglitazone (ACTOS) 45 MG tablet [Pharmacy Med Name: Pioglitazone HCl 45 MG Oral Tablet] 90 tablet 0    Sig: TAKE 1 TABLET BY MOUTH ONCE DAILY IN THE MORNING     Endocrinology:  Diabetes - Glitazones - pioglitazone Passed - 11/13/2020  7:45 PM      Passed - HBA1C is between 0 and 7.9 and within 180 days    Hgb A1c MFr Bld  Date Value Ref Range Status  10/08/2020 6.4 (H) 4.8 - 5.6 % Final    Comment:             Prediabetes: 5.7 - 6.4          Diabetes: >6.4          Glycemic control for adults with diabetes: <7.0          Passed - Valid encounter within last 6 months    Recent Outpatient Visits          1 month ago Encounter for annual wellness visit (AWV) in Medicare patient   [facility] Rosanna Randy, Retia Passe., MD   8 months ago DM (diabetes mellitus) with complications [facility])   [facility] Jerrol Banana., MD   1 year ago DM (diabetes mellitus) with complications [facility])   [facility] Jerrol Banana., MD   1 year ago Annual physical exam   [facility] Jerrol Banana., MD   1 year ago Type 2 diabetes mellitus without complication, without long-term current use of insulin [facility])   [facility] Jerrol Banana., MD      Future Appointments            In 4 months Jerrol Banana., MD [facility]

## 2020-12-03 ENCOUNTER — Other Ambulatory Visit: Payer: Self-pay | Admitting: Family Medicine

## 2020-12-14 DIAGNOSIS — I10 Essential (primary) hypertension: Secondary | ICD-10-CM | POA: Diagnosis not present

## 2020-12-14 DIAGNOSIS — I493 Ventricular premature depolarization: Secondary | ICD-10-CM | POA: Diagnosis not present

## 2020-12-14 DIAGNOSIS — I25810 Atherosclerosis of coronary artery bypass graft(s) without angina pectoris: Secondary | ICD-10-CM | POA: Diagnosis not present

## 2020-12-14 DIAGNOSIS — I2119 ST elevation (STEMI) myocardial infarction involving other coronary artery of inferior wall: Secondary | ICD-10-CM | POA: Diagnosis not present

## 2020-12-14 DIAGNOSIS — E785 Hyperlipidemia, unspecified: Secondary | ICD-10-CM | POA: Diagnosis not present

## 2020-12-14 DIAGNOSIS — E119 Type 2 diabetes mellitus without complications: Secondary | ICD-10-CM | POA: Diagnosis not present

## 2020-12-14 DIAGNOSIS — I2510 Atherosclerotic heart disease of native coronary artery without angina pectoris: Secondary | ICD-10-CM | POA: Diagnosis not present

## 2020-12-23 ENCOUNTER — Other Ambulatory Visit: Payer: Self-pay | Admitting: Family Medicine

## 2020-12-23 DIAGNOSIS — I10 Essential (primary) hypertension: Secondary | ICD-10-CM

## 2020-12-23 NOTE — Telephone Encounter (Signed)
Requested Prescriptions  Pending Prescriptions Disp Refills  . furosemide (LASIX) 40 MG tablet [Pharmacy Med Name: Furosemide 40 MG Oral Tablet] 90 tablet 0    Sig: TAKE 1 TABLET BY MOUTH IN THE MORNING     Cardiovascular:  Diuretics - Loop Failed - 12/23/2020  7:53 PM      Failed - Cr in normal range and within 360 days    Creatinine  Date Value Ref Range Status  03/27/2013 1.26 0.60 - 1.30 mg/dL Final   Creatinine, Ser  Date Value Ref Range Status  10/08/2020 1.37 (H) 0.76 - 1.27 mg/dL Final         Passed - K in normal range and within 360 days    Potassium  Date Value Ref Range Status  10/08/2020 4.1 3.5 - 5.2 mmol/L Final  05/16/2013 3.6 3.5 - 5.1 mmol/L Final         Passed - Ca in normal range and within 360 days    Calcium  Date Value Ref Range Status  10/08/2020 9.8 8.6 - 10.2 mg/dL Final   Calcium, Total  Date Value Ref Range Status  03/27/2013 9.4 8.5 - 10.1 mg/dL Final         Passed - Na in normal range and within 360 days    Sodium  Date Value Ref Range Status  10/08/2020 139 134 - 144 mmol/L Final  03/27/2013 134 (L) 136 - 145 mmol/L Final         Passed - Last BP in normal range    BP Readings from Last 1 Encounters:  10/08/20 117/69         Passed - Valid encounter within last 6 months    Recent Outpatient Visits          2 months ago Encounter for annual wellness visit (AWV) in Medicare patient   [facility] Jerrol Banana., MD   9 months ago DM (diabetes mellitus) with complications [facility])   [facility] Jerrol Banana., MD   1 year ago DM (diabetes mellitus) with complications [facility])   [facility] Jerrol Banana., MD   1 year ago Annual physical exam   [facility] Jerrol Banana., MD   1 year ago Type 2 diabetes mellitus without complication, without long-term current use of insulin [facility])   [facility] Jerrol Banana., MD       Future Appointments            In 3 months Jerrol Banana., MD [facility]

## 2021-01-08 ENCOUNTER — Other Ambulatory Visit: Payer: Self-pay | Admitting: Family Medicine

## 2021-01-09 NOTE — Telephone Encounter (Signed)
Requested medications are due for refill today.  yes  Requested medications are on the active medications list.  yes  Last refill. 01/06/2020  Future visit scheduled.   yes  Notes to clinic.  Prescription is expired.

## 2021-01-14 NOTE — Telephone Encounter (Signed)
Patient checking on the status of clopidogrel (PLAVIX) 75 MG tablet  request. Patient states request was sent over on 01/08/2021 and 01/13/2021. Patient states he is out and would like a follow up call today regarding the status [phone].    [place] ([place]), [place] - [address] Phone:  [phone]  Fax:  [phone]

## 2021-01-18 ENCOUNTER — Other Ambulatory Visit: Payer: Self-pay | Admitting: Family Medicine

## 2021-01-18 DIAGNOSIS — M1A9XX0 Chronic gout, unspecified, without tophus (tophi): Secondary | ICD-10-CM

## 2021-02-09 ENCOUNTER — Other Ambulatory Visit: Payer: Self-pay | Admitting: Family Medicine

## 2021-02-09 DIAGNOSIS — E119 Type 2 diabetes mellitus without complications: Secondary | ICD-10-CM

## 2021-02-10 NOTE — Telephone Encounter (Signed)
Requested Prescriptions  Pending Prescriptions Disp Refills  . pioglitazone (ACTOS) 45 MG tablet [Pharmacy Med Name: Pioglitazone HCl 45 MG Oral Tablet] 90 tablet 0    Sig: TAKE 1 TABLET BY MOUTH ONCE DAILY IN THE MORNING     Endocrinology:  Diabetes - Glitazones - pioglitazone Passed - 02/09/2021  7:38 PM      Passed - HBA1C is between 0 and 7.9 and within 180 days    Hgb A1c MFr Bld  Date Value Ref Range Status  10/08/2020 6.4 (H) 4.8 - 5.6 % Final    Comment:             Prediabetes: 5.7 - 6.4          Diabetes: >6.4          Glycemic control for adults with diabetes: <7.0          Passed - Valid encounter within last 6 months    Recent Outpatient Visits          4 months ago Encounter for annual wellness visit (AWV) in Medicare patient   [facility] Jerrol Banana., MD   11 months ago DM (diabetes mellitus) with complications [facility])   [facility] Jerrol Banana., MD   1 year ago DM (diabetes mellitus) with complications [facility])   [facility] Jerrol Banana., MD   1 year ago Annual physical exam   [facility] Jerrol Banana., MD   1 year ago Type 2 diabetes mellitus without complication, without long-term current use of insulin [facility])   [facility] Jerrol Banana., MD      Future Appointments            In 1 month Jerrol Banana., MD [facility]

## 2021-03-21 ENCOUNTER — Other Ambulatory Visit: Payer: Self-pay | Admitting: Family Medicine

## 2021-03-21 DIAGNOSIS — I10 Essential (primary) hypertension: Secondary | ICD-10-CM

## 2021-03-21 NOTE — Telephone Encounter (Signed)
Requested Prescriptions  Pending Prescriptions Disp Refills  . furosemide (LASIX) 40 MG tablet [Pharmacy Med Name: Furosemide 40 MG Oral Tablet] 90 tablet 0    Sig: TAKE 1 TABLET BY MOUTH IN THE MORNING     Cardiovascular:  Diuretics - Loop Failed - 03/21/2021  6:22 PM      Failed - Cr in normal range and within 360 days    Creatinine  Date Value Ref Range Status  03/27/2013 1.26 0.60 - 1.30 mg/dL Final   Creatinine, Ser  Date Value Ref Range Status  10/08/2020 1.37 (H) 0.76 - 1.27 mg/dL Final         Passed - K in normal range and within 360 days    Potassium  Date Value Ref Range Status  10/08/2020 4.1 3.5 - 5.2 mmol/L Final  05/16/2013 3.6 3.5 - 5.1 mmol/L Final         Passed - Ca in normal range and within 360 days    Calcium  Date Value Ref Range Status  10/08/2020 9.8 8.6 - 10.2 mg/dL Final   Calcium, Total  Date Value Ref Range Status  03/27/2013 9.4 8.5 - 10.1 mg/dL Final         Passed - Na in normal range and within 360 days    Sodium  Date Value Ref Range Status  10/08/2020 139 134 - 144 mmol/L Final  03/27/2013 134 (L) 136 - 145 mmol/L Final         Passed - Last BP in normal range    BP Readings from Last 1 Encounters:  10/08/20 117/69         Passed - Valid encounter within last 6 months    Recent Outpatient Visits          5 months ago Encounter for annual wellness visit (AWV) in Medicare patient   [facility]   1 year ago DM (diabetes mellitus) with complications [facility])   [facility] Jerrol Banana., MD   1 year ago DM (diabetes mellitus) with complications [facility])   [facility] Jerrol Banana., MD   1 year ago Annual physical exam   [facility] Jerrol Banana., MD   2 years ago Type 2 diabetes mellitus without complication, without long-term current use of insulin [facility])   [facility] Jerrol Banana., MD       Future Appointments            In 2 weeks Thedore Mins, Ria Comment, PA-C [facility], [facility]

## 2021-04-05 ENCOUNTER — Other Ambulatory Visit: Payer: Self-pay | Admitting: Family Medicine

## 2021-04-06 NOTE — Telephone Encounter (Signed)
Requested Prescriptions  Pending Prescriptions Disp Refills  . clopidogrel (PLAVIX) 75 MG tablet [Pharmacy Med Name: Clopidogrel Bisulfate 75 MG Oral Tablet] 90 tablet 0    Sig: Take 1 tablet by mouth once daily     Hematology: Antiplatelets - clopidogrel Failed - 04/05/2021  6:22 PM      Failed - Evaluate AST, ALT within 2 months of therapy initiation.      Passed - ALT in normal range and within 360 days    ALT  Date Value Ref Range Status  10/08/2020 18 0 - 44 IU/L Final   SGPT (ALT)  Date Value Ref Range Status  03/27/2013 30 12 - 78 U/L Final         Passed - AST in normal range and within 360 days    AST  Date Value Ref Range Status  10/08/2020 17 0 - 40 IU/L Final   SGOT(AST)  Date Value Ref Range Status  03/27/2013 14 (L) 15 - 37 Unit/L Final         Passed - HCT in normal range and within 180 days    Hematocrit  Date Value Ref Range Status  10/08/2020 42.6 37.5 - 51.0 % Final         Passed - HGB in normal range and within 180 days    Hemoglobin  Date Value Ref Range Status  10/08/2020 14.0 13.0 - 17.7 g/dL Final         Passed - PLT in normal range and within 180 days    Platelets  Date Value Ref Range Status  10/08/2020 182 150 - 450 x10E3/uL Final         Passed - Valid encounter within last 6 months    Recent Outpatient Visits          6 months ago Encounter for annual wellness visit (AWV) in Medicare patient   [facility] Jerrol Banana., MD   1 year ago DM (diabetes mellitus) with complications [facility])   [facility] Jerrol Banana., MD   1 year ago DM (diabetes mellitus) with complications [facility])   [facility] Jerrol Banana., MD   1 year ago Annual physical exam   [facility] Jerrol Banana., MD   2 years ago Type 2 diabetes mellitus without complication, without long-term current use of insulin [facility])   [facility] Jerrol Banana., MD       Future Appointments            In 2 days Mikey Kirschner, PA-C [facility], [facility]

## 2021-04-07 DIAGNOSIS — M79672 Pain in left foot: Secondary | ICD-10-CM | POA: Diagnosis not present

## 2021-04-07 DIAGNOSIS — M7989 Other specified soft tissue disorders: Secondary | ICD-10-CM | POA: Diagnosis not present

## 2021-04-08 ENCOUNTER — Other Ambulatory Visit: Payer: Self-pay

## 2021-04-08 ENCOUNTER — Encounter: Payer: Self-pay | Admitting: Physician Assistant

## 2021-04-08 ENCOUNTER — Ambulatory Visit (INDEPENDENT_AMBULATORY_CARE_PROVIDER_SITE_OTHER): Payer: Medicare Other | Admitting: Physician Assistant

## 2021-04-08 VITALS — BP 127/66 | HR 78 | Temp 98.8°F | Ht 69.0 in | Wt 205.0 lb

## 2021-04-08 DIAGNOSIS — E119 Type 2 diabetes mellitus without complications: Secondary | ICD-10-CM | POA: Diagnosis not present

## 2021-04-08 DIAGNOSIS — E7800 Pure hypercholesterolemia, unspecified: Secondary | ICD-10-CM | POA: Diagnosis not present

## 2021-04-08 DIAGNOSIS — I10 Essential (primary) hypertension: Secondary | ICD-10-CM | POA: Diagnosis not present

## 2021-04-08 NOTE — Assessment & Plan Note (Signed)
Will recheck lipid panel. Continue with mediations

## 2021-04-08 NOTE — Progress Notes (Signed)
Date:  04/08/2021   Name:  Todd Olson   DOB:  03-05-1941   MRN:  [id number]   Chief Complaint: Hypertension, Hyperlipidemia, and Diabetes follow up  Hypertension Compliant with multiple medications: amlodpine 5 mg daily, lasix 40 mg daily, lisinopril-hctz 20/12.5 daily, metroprolol tartrate 25 mg BID. Reports home BP is 127/66 this morning.   Hyperlipidemia No SE on statin.  DM Reports home blood sugars range 140s-160s in AM. Last A1c 6.4%. Compliant with pioglitazone and metformin.   No other issues to discuss today. Pt does reports that he went to [facility] yesterday for foot pain, a stress fracture was found in the left foot and he now wears a boot.  Lab Results  Component Value Date   NA 139 10/08/2020   K 4.1 10/08/2020   CO2 24 10/08/2020   GLUCOSE 125 (H) 10/08/2020   BUN 24 10/08/2020   CREATININE 1.37 (H) 10/08/2020   CALCIUM 9.8 10/08/2020   EGFR 53 (L) 10/08/2020   GFRNONAA 71 07/10/2019   Lab Results  Component Value Date   CHOL 132 10/08/2020   HDL 53 10/08/2020   LDLCALC 51 10/08/2020   TRIG 166 (H) 10/08/2020   CHOLHDL 2.5 10/08/2020   Lab Results  Component Value Date   TSH 1.100 10/08/2020   Lab Results  Component Value Date   HGBA1C 6.4 (H) 10/08/2020   Lab Results  Component Value Date   WBC 5.7 10/08/2020   HGB 14.0 10/08/2020   HCT 42.6 10/08/2020   MCV 93 10/08/2020   PLT 182 10/08/2020   Lab Results  Component Value Date   ALT 18 10/08/2020   AST 17 10/08/2020   ALKPHOS 62 10/08/2020   BILITOT 0.7 10/08/2020   No results found for: 25OHVITD2, 25OHVITD3, VD25OH   Review of Systems  Constitutional:  Negative for fatigue and fever.  Respiratory:  Negative for cough, chest tightness and shortness of breath.   Cardiovascular:  Negative for chest pain and leg swelling.  Neurological:  Negative for dizziness and light-headedness.   Patient Active Problem List   Diagnosis Date Noted   Encounter for screening colonoscopy     DM (diabetes mellitus) with complications ([place]) 23/95/3202   Arteriosclerosis of nonautologous coronary artery bypass graft 09/10/2014   Back pain, chronic 09/10/2014   Diabetes mellitus, type 2 ([place]) 09/10/2014   Well controlled diabetes mellitus ([place]) 09/10/2014   Hemorrhoid 09/10/2014   HLD (hyperlipidemia) 09/10/2014   BP (high blood pressure) 09/10/2014   Fatty tumor 09/10/2014   Absolute anemia 09/10/2014   Muscle ache 09/10/2014   Arthritis of hand, degenerative 09/10/2014   Benign neoplasm of colon 09/10/2014   Rotator cuff syndrome 09/10/2014   Head revolving around 09/10/2014   Adynamia 09/10/2014    Allergies  Allergen Reactions   Oxycodone Other (See Comments)    hallucination   Penicillins Rash    Rash as a child.  Has not taken since then Has patient had a PCN reaction causing immediate rash, facial/tongue/throat swelling, SOB or lightheadedness with hypotension: Yes Has patient had a PCN reaction causing severe rash involving mucus membranes or skin necrosis: No Has patient had a PCN reaction that required hospitalization: No Has patient had a PCN reaction occurring within the last 10 years: No If all of the above answers are "NO", then may proceed with Cephalosporin use.     Past Surgical History:  Procedure Laterality Date   BACK SURGERY  2016   laminectomy lumbar  CATARACT EXTRACTION W/PHACO Left 12/06/2016   Procedure: CATARACT EXTRACTION PHACO AND INTRAOCULAR LENS PLACEMENT (IOC);  Surgeon: Birder Robson, MD;  Location: [facility];  Service: Ophthalmology;  Laterality: Left;  [place] 00:31 AP% 15.6 CDE 4.99 Fluid pack lot # [id number]   CATARACT EXTRACTION W/PHACO Right 12/27/2016   Procedure: CATARACT EXTRACTION PHACO AND INTRAOCULAR LENS PLACEMENT (IOC);  Surgeon: Birder Robson, MD;  Location: [facility];  Service: Ophthalmology;  Laterality: Right;  [place] 00:46 AP% 12.5 CDE 5.79 Fluid pack lot # [id number]   CHOLECYSTECTOMY  2013   Dr. Rochel Brome    COLONOSCOPY WITH PROPOFOL N/A 11/12/2018   Procedure: COLONOSCOPY WITH PROPOFOL;  Surgeon: Lucilla Lame, MD;  Location: [facility];  Service: Endoscopy;  Laterality: N/A;  diabetic-oral meds   colonosopy  2010, 2013, 2014   CORONARY ANGIOPLASTY     STENT PLACEMENT X1   EYE SURGERY     bilateral   FOOT SURGERY  1996   HEMORROIDECTOMY  2014   RECTAL SURGERY     repair of bleed after hemrrhoid surgery   SHOULDER ARTHROSCOPY WITH OPEN ROTATOR CUFF REPAIR AND DISTAL CLAVICLE ACROMINECTOMY Right 08/15/2017   Procedure: right shoulder arthroscopy, arthroscopic subacromial decompression, distal clavicle excision, biceps tenotomy, open rotator cuff repair;  Surgeon: Thornton Park, MD;  Location: [facility];  Service: Orthopedics;  Laterality: Right;   SHOULDER SURGERY Left 2015    Social History   Tobacco Use   Smoking status: Former    Packs/day: 2.00    Years: 22.00    Pack years: 44.00    Types: Cigarettes    Quit date: 11/17/1977    Years since quitting: 43.4   Smokeless tobacco: Never  Vaping Use   Vaping Use: Never used  Substance Use Topics   Alcohol use: No    Alcohol/week: 0.0 standard drinks   Drug use: No     Medication list has been reviewed and updated.  Current Meds  Medication Sig   allopurinol (ZYLOPRIM) 100 MG tablet Take 1 tablet by mouth once daily   amLODipine (NORVASC) 5 MG tablet Take 1 tablet by mouth once daily   aspirin 81 MG tablet Take 81 mg by mouth every other day. At night   Cholecalciferol 1000 UNITS capsule Take 1,000 Units by mouth daily. am   clopidogrel (PLAVIX) 75 MG tablet Take 1 tablet by mouth once daily   furosemide (LASIX) 40 MG tablet TAKE 1 TABLET BY MOUTH IN THE MORNING   Glucosamine-Chondroit-Vit C-Mn (GLUCOSAMINE CHONDR 1500 COMPLX) CAPS Take 1 capsule by mouth 2 (two) times daily.    lisinopril-hydrochlorothiazide (ZESTORETIC) 20-12.5 MG tablet Take 1 tablet by mouth once daily   magnesium oxide (MAG-OX) 400 (241.3 MG) MG  tablet Take 400 mg by mouth 2 (two) times daily. Am and pm   metFORMIN (GLUCOPHAGE) 1000 MG tablet TAKE 1 TABLET BY MOUTH TWICE DAILY WITH MEALS   metoprolol tartrate (LOPRESSOR) 25 MG tablet Take 1 tablet by mouth twice daily   ondansetron (ZOFRAN) 4 MG tablet Take 1 tablet (4 mg total) by mouth every 8 (eight) hours as needed for nausea or vomiting.   ONETOUCH ULTRA test strip USE TO CHECK BLOOD SUGAR TWICE DAILY   pioglitazone (ACTOS) 45 MG tablet TAKE 1 TABLET BY MOUTH ONCE DAILY IN THE MORNING   rosuvastatin (CRESTOR) 10 MG tablet Take 1 tablet by mouth once daily   [DISCONTINUED] lovastatin (MEVACOR) 40 MG tablet TAKE 1 TABLET BY MOUTH AT BEDTIME   [DISCONTINUED] methylPREDNISolone (MEDROL DOSEPAK)  4 MG TBPK tablet Take by mouth.    PHQ 2/9 Scores 04/08/2021 03/16/2020 07/10/2019 07/04/2018  PHQ - 2 Score 0 0 0 1  PHQ- 9 Score 0 0 - 1    No flowsheet data found.  BP Readings from Last 3 Encounters:  04/08/21 (!) 143/79  10/08/20 117/69  03/16/20 (!) 144/65    Physical Exam Constitutional:      General: He is awake.     Appearance: He is well-developed.  HENT:     Head: Normocephalic.  Eyes:     Conjunctiva/sclera: Conjunctivae normal.  Cardiovascular:     Rate and Rhythm: Normal rate and regular rhythm.     Pulses: Normal pulses.     Heart sounds: Normal heart sounds.  Pulmonary:     Effort: Pulmonary effort is normal.     Breath sounds: Normal breath sounds.  Skin:    General: Skin is warm.  Neurological:     Mental Status: He is alert and oriented to person, place, and time.  Psychiatric:        Attention and Perception: Attention normal.        Mood and Affect: Mood normal.        Speech: Speech normal.        Behavior: Behavior is cooperative.    Wt Readings from Last 3 Encounters:  04/08/21 205 lb (93 kg)  10/08/20 207 lb (93.9 kg)  03/16/20 207 lb (93.9 kg)    BP (!) 143/79   Pulse 78   Temp 98.8 F (37.1 C) (Oral)   Ht $R'5\' 9"'HJ$  (1.753 m)   Wt 205 lb  (93 kg) Comment: pt wearing boot  SpO2 98%   BMI 30.27 kg/m   Assessment and Plan:  Problem List Items Addressed This Visit       Cardiovascular and Mediastinum   BP (high blood pressure)    Well controlled on medication, elevated in office reading likely secondary to difficulties with ambulation due to boot.       Relevant Orders   Comprehensive Metabolic Panel (CMET)     Endocrine   Diabetes mellitus, type 2 ([place]) - Primary    Will recheck A1c. Ok with AM home blood sugars in 140s.        Relevant Orders   HgB A1c     Other   HLD (hyperlipidemia)    Will recheck lipid panel. Continue with mediations      Relevant Orders   Lipid panel   Comprehensive Metabolic Panel (CMET)  10 minutes were spent clarifying current medication regimen.   Return in about 6 months (around 10/07/2021) for hypertension, DMII, hyperlipidemia.  I, Mikey Kirschner, PA-C have reviewed all documentation for this visit. The documentation on  04/08/2021  for the exam, diagnosis, procedures, and orders are all accurate and complete.  Mikey Kirschner, PA-C [facility] [address] [place], [place], [address] Office: [phone] Fax: [phone]

## 2021-04-08 NOTE — Assessment & Plan Note (Signed)
Will recheck A1c. Ok with AM home blood sugars in 140s.

## 2021-04-08 NOTE — Assessment & Plan Note (Signed)
Well controlled on medication, elevated in office reading likely secondary to difficulties with ambulation due to boot.

## 2021-04-09 LAB — COMPREHENSIVE METABOLIC PANEL
ALT: 15 IU/L (ref 0–44)
AST: 18 IU/L (ref 0–40)
Albumin/Globulin Ratio: 2.2 (ref 1.2–2.2)
Albumin: 4.8 g/dL — ABNORMAL HIGH (ref 3.7–4.7)
Alkaline Phosphatase: 73 IU/L (ref 44–121)
BUN/Creatinine Ratio: 18 (ref 10–24)
BUN: 23 mg/dL (ref 8–27)
Bilirubin Total: 0.7 mg/dL (ref 0.0–1.2)
CO2: 23 mmol/L (ref 20–29)
Calcium: 9.9 mg/dL (ref 8.6–10.2)
Chloride: 99 mmol/L (ref 96–106)
Creatinine, Ser: 1.29 mg/dL — ABNORMAL HIGH (ref 0.76–1.27)
Globulin, Total: 2.2 g/dL (ref 1.5–4.5)
Glucose: 146 mg/dL — ABNORMAL HIGH (ref 70–99)
Potassium: 4 mmol/L (ref 3.5–5.2)
Sodium: 138 mmol/L (ref 134–144)
Total Protein: 7 g/dL (ref 6.0–8.5)
eGFR: 56 mL/min/{1.73_m2} — ABNORMAL LOW (ref >59)

## 2021-04-09 LAB — LIPID PANEL
Chol/HDL Ratio: 2.7 ratio (ref 0.0–5.0)
Cholesterol, Total: 145 mg/dL (ref 100–199)
HDL: 53 mg/dL (ref >39)
LDL Chol Calc (NIH): 70 mg/dL (ref 0–99)
Triglycerides: 122 mg/dL (ref 0–149)
VLDL Cholesterol Cal: 22 mg/dL (ref 5–40)

## 2021-04-09 LAB — HEMOGLOBIN A1C
Est. average glucose Bld gHb Est-mCnc: 140 mg/dL
Hgb A1c MFr Bld: 6.5 % — ABNORMAL HIGH (ref 4.8–5.6)

## 2021-04-20 ENCOUNTER — Other Ambulatory Visit: Payer: Self-pay | Admitting: Family Medicine

## 2021-04-20 DIAGNOSIS — I10 Essential (primary) hypertension: Secondary | ICD-10-CM

## 2021-04-21 NOTE — Telephone Encounter (Signed)
Requested Prescriptions  Pending Prescriptions Disp Refills   metoprolol tartrate (LOPRESSOR) 25 MG tablet [Pharmacy Med Name: Metoprolol Tartrate 25 MG Oral Tablet] 180 tablet 0    Sig: Take 1 tablet by mouth twice daily     Cardiovascular:  Beta Blockers Passed - 04/20/2021  6:38 PM      Passed - Last BP in normal range    BP Readings from Last 1 Encounters:  04/08/21 127/66         Passed - Last Heart Rate in normal range    Pulse Readings from Last 1 Encounters:  04/08/21 78         Passed - Valid encounter within last 6 months    Recent Outpatient Visits          1 week ago Type 2 diabetes mellitus without complication, without long-term current use of insulin ([place])   [facility] Thedore Mins, [place], PA-C   6 months ago Encounter for annual wellness visit (AWV) in Medicare patient   [employer] Rosanna Randy, Retia Passe., MD   1 year ago DM (diabetes mellitus) with complications [facility])   [facility] Jerrol Banana., MD   1 year ago DM (diabetes mellitus) with complications [facility])   [facility] Jerrol Banana., MD   1 year ago Annual physical exam   [facility] Jerrol Banana., MD      Future Appointments            In 5 months Jerrol Banana., MD [facility]            amLODipine (NORVASC) 5 MG tablet [Pharmacy Med Name: amLODIPine Besylate 5 MG Oral Tablet] 90 tablet 0    Sig: Take 1 tablet by mouth once daily     Cardiovascular:  Calcium Channel Blockers Passed - 04/20/2021  6:38 PM      Passed - Last BP in normal range    BP Readings from Last 1 Encounters:  04/08/21 127/66         Passed - Valid encounter within last 6 months    Recent Outpatient Visits          1 week ago Type 2 diabetes mellitus without complication, without long-term current use of insulin ([place])   [facility] Thedore Mins, [place], PA-C   6 months ago Encounter  for annual wellness visit (AWV) in Medicare patient   [facility] Rosanna Randy, Retia Passe., MD   1 year ago DM (diabetes mellitus) with complications [facility])   [facility] Jerrol Banana., MD   1 year ago DM (diabetes mellitus) with complications [facility])   [facility] Jerrol Banana., MD   1 year ago Annual physical exam   [facility] Jerrol Banana., MD      Future Appointments            In 5 months Jerrol Banana., MD [facility]

## 2021-04-30 ENCOUNTER — Other Ambulatory Visit: Payer: Self-pay | Admitting: Family Medicine

## 2021-04-30 NOTE — Telephone Encounter (Signed)
Requested Prescriptions  Pending Prescriptions Disp Refills   metFORMIN (GLUCOPHAGE) 1000 MG tablet [Pharmacy Med Name: metFORMIN HCl 1000 MG Oral Tablet] 180 tablet 1    Sig: TAKE 1 TABLET BY MOUTH TWICE DAILY WITH MEALS     Endocrinology:  Diabetes - Biguanides Failed - 04/30/2021  6:30 PM      Failed - Cr in normal range and within 360 days    Creatinine  Date Value Ref Range Status  03/27/2013 1.26 0.60 - 1.30 mg/dL Final   Creatinine, Ser  Date Value Ref Range Status  04/08/2021 1.29 (H) 0.76 - 1.27 mg/dL Final         Failed - eGFR in normal range and within 360 days    EGFR (African American)  Date Value Ref Range Status  03/27/2013 >60  Final   GFR calc Af Amer  Date Value Ref Range Status  07/10/2019 83 >59 mL/min/1.73 Final   EGFR (Non-African Amer.)  Date Value Ref Range Status  03/27/2013 57 (L)  Final    Comment:    eGFR values <29mL/min/1.73 m2 may be an indication of chronic kidney disease (CKD). Calculated eGFR is useful in patients with stable renal function. The eGFR calculation will not be reliable in acutely ill patients when serum creatinine is changing rapidly. It is not useful in  patients on dialysis. The eGFR calculation may not be applicable to patients at the low and high extremes of body sizes, pregnant women, and vegetarians.    GFR calc non Af Amer  Date Value Ref Range Status  07/10/2019 71 >59 mL/min/1.73 Final   eGFR  Date Value Ref Range Status  04/08/2021 56 (L) >59 mL/min/1.73 Final         Passed - HBA1C is between 0 and 7.9 and within 180 days    Hgb A1c MFr Bld  Date Value Ref Range Status  04/08/2021 6.5 (H) 4.8 - 5.6 % Final    Comment:             Prediabetes: 5.7 - 6.4          Diabetes: >6.4          Glycemic control for adults with diabetes: <7.0          Passed - Valid encounter within last 6 months    Recent Outpatient Visits          3 weeks ago Type 2 diabetes mellitus without complication, without  long-term current use of insulin ([place])   [facility] Thedore Mins, [place], PA-C   6 months ago Encounter for annual wellness visit (AWV) in Medicare patient   [facility] Rosanna Randy, Retia Passe., MD   1 year ago DM (diabetes mellitus) with complications [facility])   [facility] Jerrol Banana., MD   1 year ago DM (diabetes mellitus) with complications [facility])   [facility] Jerrol Banana., MD   1 year ago Annual physical exam   [facility] Jerrol Banana., MD      Future Appointments            In 5 months Jerrol Banana., MD [facility]

## 2021-05-05 ENCOUNTER — Other Ambulatory Visit: Payer: Self-pay | Admitting: Family Medicine

## 2021-05-05 DIAGNOSIS — E119 Type 2 diabetes mellitus without complications: Secondary | ICD-10-CM

## 2021-06-04 ENCOUNTER — Other Ambulatory Visit: Payer: Self-pay | Admitting: Family Medicine

## 2021-06-04 NOTE — Telephone Encounter (Signed)
Requested Prescriptions  Pending Prescriptions Disp Refills   lisinopril-hydrochlorothiazide (ZESTORETIC) 20-12.5 MG tablet [Pharmacy Med Name: Lisinopril-hydroCHLOROthiazide 20-12.5 MG Oral Tablet] 90 tablet 0    Sig: Take 1 tablet by mouth once daily     Cardiovascular:  ACEI + Diuretic Combos Failed - 06/04/2021  8:37 AM      Failed - Cr in normal range and within 180 days    Creatinine  Date Value Ref Range Status  03/27/2013 1.26 0.60 - 1.30 mg/dL Final   Creatinine, Ser  Date Value Ref Range Status  04/08/2021 1.29 (H) 0.76 - 1.27 mg/dL Final         Passed - Na in normal range and within 180 days    Sodium  Date Value Ref Range Status  04/08/2021 138 134 - 144 mmol/L Final  03/27/2013 134 (L) 136 - 145 mmol/L Final         Passed - K in normal range and within 180 days    Potassium  Date Value Ref Range Status  04/08/2021 4.0 3.5 - 5.2 mmol/L Final  05/16/2013 3.6 3.5 - 5.1 mmol/L Final         Passed - eGFR is 30 or above and within 180 days    EGFR (African American)  Date Value Ref Range Status  03/27/2013 >60  Final   GFR calc Af Amer  Date Value Ref Range Status  07/10/2019 83 >59 mL/min/1.73 Final   EGFR (Non-African Amer.)  Date Value Ref Range Status  03/27/2013 57 (L)  Final    Comment:    eGFR values <13mL/min/1.73 m2 may be an indication of chronic kidney disease (CKD). Calculated eGFR is useful in patients with stable renal function. The eGFR calculation will not be reliable in acutely ill patients when serum creatinine is changing rapidly. It is not useful in  patients on dialysis. The eGFR calculation may not be applicable to patients at the low and high extremes of body sizes, pregnant women, and vegetarians.    GFR calc non Af Amer  Date Value Ref Range Status  07/10/2019 71 >59 mL/min/1.73 Final   eGFR  Date Value Ref Range Status  04/08/2021 56 (L) >59 mL/min/1.73 Final         Passed - Patient is not pregnant      Passed - Last  BP in normal range    BP Readings from Last 1 Encounters:  04/08/21 127/66         Passed - Valid encounter within last 6 months    Recent Outpatient Visits          1 month ago Type 2 diabetes mellitus without complication, without long-term current use of insulin ([place])   [facility] Thedore Mins, [place], PA-C   7 months ago Encounter for annual wellness visit (AWV) in Medicare patient   [facility] Rosanna Randy, Retia Passe., MD   1 year ago DM (diabetes mellitus) with complications [facility])   [facility] Jerrol Banana., MD   1 year ago DM (diabetes mellitus) with complications [facility])   [facility] Jerrol Banana., MD   1 year ago Annual physical exam   [facility] Jerrol Banana., MD      Future Appointments            In 4 months Jerrol Banana., MD [facility]

## 2021-06-09 DIAGNOSIS — E119 Type 2 diabetes mellitus without complications: Secondary | ICD-10-CM | POA: Diagnosis not present

## 2021-06-09 DIAGNOSIS — E7800 Pure hypercholesterolemia, unspecified: Secondary | ICD-10-CM | POA: Diagnosis not present

## 2021-06-09 DIAGNOSIS — I493 Ventricular premature depolarization: Secondary | ICD-10-CM | POA: Diagnosis not present

## 2021-06-09 DIAGNOSIS — I10 Essential (primary) hypertension: Secondary | ICD-10-CM | POA: Diagnosis not present

## 2021-06-09 DIAGNOSIS — E785 Hyperlipidemia, unspecified: Secondary | ICD-10-CM | POA: Diagnosis not present

## 2021-06-09 DIAGNOSIS — I2510 Atherosclerotic heart disease of native coronary artery without angina pectoris: Secondary | ICD-10-CM | POA: Diagnosis not present

## 2021-06-09 DIAGNOSIS — I25810 Atherosclerosis of coronary artery bypass graft(s) without angina pectoris: Secondary | ICD-10-CM | POA: Diagnosis not present

## 2021-06-09 DIAGNOSIS — I2119 ST elevation (STEMI) myocardial infarction involving other coronary artery of inferior wall: Secondary | ICD-10-CM | POA: Diagnosis not present

## 2021-06-17 DIAGNOSIS — I2119 ST elevation (STEMI) myocardial infarction involving other coronary artery of inferior wall: Secondary | ICD-10-CM | POA: Diagnosis not present

## 2021-06-17 DIAGNOSIS — I25810 Atherosclerosis of coronary artery bypass graft(s) without angina pectoris: Secondary | ICD-10-CM | POA: Diagnosis not present

## 2021-06-17 DIAGNOSIS — I2510 Atherosclerotic heart disease of native coronary artery without angina pectoris: Secondary | ICD-10-CM | POA: Diagnosis not present

## 2021-06-20 ENCOUNTER — Other Ambulatory Visit: Payer: Self-pay | Admitting: Family Medicine

## 2021-06-20 DIAGNOSIS — I10 Essential (primary) hypertension: Secondary | ICD-10-CM

## 2021-06-24 DIAGNOSIS — I493 Ventricular premature depolarization: Secondary | ICD-10-CM | POA: Diagnosis not present

## 2021-06-24 DIAGNOSIS — E785 Hyperlipidemia, unspecified: Secondary | ICD-10-CM | POA: Diagnosis not present

## 2021-06-24 DIAGNOSIS — I10 Essential (primary) hypertension: Secondary | ICD-10-CM | POA: Diagnosis not present

## 2021-06-24 DIAGNOSIS — I2510 Atherosclerotic heart disease of native coronary artery without angina pectoris: Secondary | ICD-10-CM | POA: Diagnosis not present

## 2021-07-09 ENCOUNTER — Other Ambulatory Visit: Payer: Self-pay | Admitting: Family Medicine

## 2021-07-09 DIAGNOSIS — E7800 Pure hypercholesterolemia, unspecified: Secondary | ICD-10-CM

## 2021-08-05 DIAGNOSIS — Z961 Presence of intraocular lens: Secondary | ICD-10-CM | POA: Diagnosis not present

## 2021-08-05 LAB — HM DIABETES EYE EXAM

## 2021-08-14 ENCOUNTER — Other Ambulatory Visit: Payer: Self-pay | Admitting: Family Medicine

## 2021-08-14 DIAGNOSIS — E119 Type 2 diabetes mellitus without complications: Secondary | ICD-10-CM

## 2021-10-06 NOTE — Progress Notes (Signed)
Established patient visit  I,[name],acting as a scribe for Todd Durie, MD.,have documented all relevant documentation on the behalf of Todd Durie, MD,as directed by  Todd Durie, MD while in the presence of Todd Durie, MD.   Patient: Todd Olson   DOB: 1941-01-23   81 y.o. Male  MRN: [id number] Visit Date: 10/07/2021  Today's healthcare provider: [name], MD   Chief Complaint  Patient presents with   Follow-up   Diabetes   Hypertension   Subjective    HPI    Diabetes Mellitus Type II, follow-up  Lab Results  Component Value Date   HGBA1C 6.4 (A) 10/07/2021   HGBA1C 6.5 (H) 04/08/2021   HGBA1C 6.4 (H) 10/08/2020   Last seen for diabetes 6 months ago.  Management since then includes continuing the same treatment.  Home blood sugar records: fasting range: 139 Most Recent Eye Exam: 08/05/2021  --------------------------------------------------------------------------------------------------- Hypertension, follow-up  BP Readings from Last 3 Encounters:  10/07/21 122/61  04/08/21 127/66  10/08/20 117/69   Wt Readings from Last 3 Encounters:  10/07/21 203 lb (92.1 kg)  04/08/21 205 lb (93 kg)  10/08/20 207 lb (93.9 kg)     He was last seen for hypertension 6 months ago.  Management since that visit includes; Well controlled on medication. Outside blood pressures are 116/62.  --------------------------------------------------------------------------------------------------   Medications: Outpatient Medications Prior to Visit  Medication Sig   allopurinol (ZYLOPRIM) 100 MG tablet Take 1 tablet by mouth once daily   amLODipine (NORVASC) 5 MG tablet Take 1 tablet by mouth once daily   aspirin 81 MG tablet Take 81 mg by mouth every other day. At night   Cholecalciferol 1000 UNITS capsule Take 1,000 Units by mouth daily. am   clopidogrel (PLAVIX) 75 MG tablet Take 1 tablet by mouth once daily   furosemide  (LASIX) 40 MG tablet TAKE 1 TABLET BY MOUTH IN THE MORNING   Glucosamine-Chondroit-Vit C-Mn (GLUCOSAMINE CHONDR 1500 COMPLX) CAPS Take 1 capsule by mouth 2 (two) times daily.    lisinopril-hydrochlorothiazide (ZESTORETIC) 20-12.5 MG tablet Take 1 tablet by mouth once daily   magnesium oxide (MAG-OX) 400 (241.3 MG) MG tablet Take 400 mg by mouth 2 (two) times daily. Am and pm   metFORMIN (GLUCOPHAGE) 1000 MG tablet TAKE 1 TABLET BY MOUTH TWICE DAILY WITH MEALS   metoprolol tartrate (LOPRESSOR) 25 MG tablet Take 1 tablet by mouth twice daily   ONETOUCH ULTRA test strip USE TO CHECK BLOOD SUGAR TWICE DAILY   pioglitazone (ACTOS) 45 MG tablet TAKE 1 TABLET BY MOUTH IN THE MORNING   rosuvastatin (CRESTOR) 10 MG tablet Take 1 tablet by mouth once daily   [DISCONTINUED] ondansetron (ZOFRAN) 4 MG tablet Take 1 tablet (4 mg total) by mouth every 8 (eight) hours as needed for nausea or vomiting. (Patient not taking: Reported on 10/07/2021)   No facility-administered medications prior to visit.    Review of Systems  Constitutional:  Negative for appetite change, chills and fever.  Respiratory:  Negative for chest tightness, shortness of breath and wheezing.   Cardiovascular:  Negative for chest pain and palpitations.  Gastrointestinal:  Negative for abdominal pain, nausea and vomiting.    Last hemoglobin A1c Lab Results  Component Value Date   HGBA1C 6.4 (A) 10/07/2021       Objective    BP 122/61 (BP Location: Left Arm, Patient Position: Sitting, Cuff Size: Large)   Pulse 62   Resp  16   Wt 203 lb (92.1 kg)   SpO2 99%   BMI 29.98 kg/m  BP Readings from Last 3 Encounters:  10/07/21 122/61  04/08/21 127/66  10/08/20 117/69   Wt Readings from Last 3 Encounters:  10/07/21 203 lb (92.1 kg)  04/08/21 205 lb (93 kg)  10/08/20 207 lb (93.9 kg)      Physical Exam Vitals reviewed.  Constitutional:      General: He is not in acute distress.    Appearance: He is well-developed.  HENT:      Head: Normocephalic and atraumatic.     Right Ear: Hearing normal.     Left Ear: Hearing normal.     Nose: Nose normal.  Eyes:     General: Lids are normal. No scleral icterus.       Right eye: No discharge.        Left eye: No discharge.     Conjunctiva/sclera: Conjunctivae normal.  Cardiovascular:     Rate and Rhythm: Normal rate and regular rhythm.     Heart sounds: Normal heart sounds.  Pulmonary:     Effort: Pulmonary effort is normal. No respiratory distress.  Skin:    Findings: No lesion or rash.  Neurological:     General: No focal deficit present.     Mental Status: He is alert and oriented to person, place, and time.  Psychiatric:        Mood and Affect: Mood normal.        Speech: Speech normal.        Behavior: Behavior normal.        Thought Content: Thought content normal.        Judgment: Judgment normal.       Results for orders placed or performed in visit on 10/07/21  POCT glycosylated hemoglobin (Hb A1C)  Result Value Ref Range   Hemoglobin A1C 6.4 (A) 4.0 - 5.6 %   Est. average glucose Bld gHb Est-mCnc 137     Assessment & Plan     1. Type 2 diabetes mellitus without complication, without long-term current use of insulin (HCC) Uncontrolled with an A1c of 6.4. - Microalbumin, urine - POCT glycosylated hemoglobin (Hb A1C)  2. Essential hypertension Very good blood pressure control - Microalbumin, urine  3. Arteriosclerosis of nonautologous coronary artery bypass graft After treated.  Patient recently had a normal nuclear ETT  4. Pure hypercholesterolemia Simvastatin 10   Return in about 4 months (around 02/06/2022).      I, Todd Durie, MD, have reviewed all documentation for this visit. The documentation on 10/15/21 for the exam, diagnosis, procedures, and orders are all accurate and complete.    Todd Carbonell Cranford Mon, MD  [facility] [phone] (phone) [phone] (fax)  [place]

## 2021-10-07 ENCOUNTER — Ambulatory Visit (INDEPENDENT_AMBULATORY_CARE_PROVIDER_SITE_OTHER): Payer: Medicare Other | Admitting: Family Medicine

## 2021-10-07 ENCOUNTER — Encounter: Payer: Self-pay | Admitting: Family Medicine

## 2021-10-07 VITALS — BP 122/61 | HR 62 | Resp 16 | Wt 203.0 lb

## 2021-10-07 DIAGNOSIS — E7800 Pure hypercholesterolemia, unspecified: Secondary | ICD-10-CM

## 2021-10-07 DIAGNOSIS — E119 Type 2 diabetes mellitus without complications: Secondary | ICD-10-CM | POA: Diagnosis not present

## 2021-10-07 DIAGNOSIS — I10 Essential (primary) hypertension: Secondary | ICD-10-CM

## 2021-10-07 DIAGNOSIS — I25810 Atherosclerosis of coronary artery bypass graft(s) without angina pectoris: Secondary | ICD-10-CM

## 2021-10-07 LAB — POCT GLYCOSYLATED HEMOGLOBIN (HGB A1C)
Est. average glucose Bld gHb Est-mCnc: 137
Hemoglobin A1C: 6.4 % — AB (ref 4.0–5.6)

## 2021-10-07 NOTE — Patient Instructions (Signed)
GET SHINGRIX VACCINE.

## 2021-10-08 LAB — MICROALBUMIN, URINE: Microalbumin, Urine: 6.6 ug/mL

## 2021-10-18 ENCOUNTER — Other Ambulatory Visit: Payer: Self-pay | Admitting: Family Medicine

## 2021-10-18 DIAGNOSIS — I10 Essential (primary) hypertension: Secondary | ICD-10-CM

## 2021-10-22 ENCOUNTER — Other Ambulatory Visit: Payer: Self-pay | Admitting: Family Medicine

## 2021-10-28 ENCOUNTER — Other Ambulatory Visit: Payer: Self-pay | Admitting: Family Medicine

## 2021-10-28 DIAGNOSIS — M1A9XX0 Chronic gout, unspecified, without tophus (tophi): Secondary | ICD-10-CM

## 2021-10-30 ENCOUNTER — Other Ambulatory Visit: Payer: Self-pay | Admitting: Family Medicine

## 2021-11-26 ENCOUNTER — Other Ambulatory Visit: Payer: Self-pay | Admitting: Family Medicine

## 2021-11-29 NOTE — Telephone Encounter (Signed)
Requested Prescriptions  Pending Prescriptions Disp Refills  . lisinopril-hydrochlorothiazide (ZESTORETIC) 20-12.5 MG tablet [Pharmacy Med Name: Lisinopril-hydroCHLOROthiazide 20-12.5 MG Oral Tablet] 90 tablet 1    Sig: Take 1 tablet by mouth once daily     Cardiovascular:  ACEI + Diuretic Combos Failed - 11/26/2021  7:57 PM      Failed - Na in normal range and within 180 days    Sodium  Date Value Ref Range Status  04/08/2021 138 134 - 144 mmol/L Final  03/27/2013 134 (L) 136 - 145 mmol/L Final         Failed - K in normal range and within 180 days    Potassium  Date Value Ref Range Status  04/08/2021 4.0 3.5 - 5.2 mmol/L Final  05/16/2013 3.6 3.5 - 5.1 mmol/L Final         Failed - Cr in normal range and within 180 days    Creatinine  Date Value Ref Range Status  03/27/2013 1.26 0.60 - 1.30 mg/dL Final   Creatinine, Ser  Date Value Ref Range Status  04/08/2021 1.29 (H) 0.76 - 1.27 mg/dL Final         Failed - eGFR is 30 or above and within 180 days    EGFR (African American)  Date Value Ref Range Status  03/27/2013 >60  Final   GFR calc Af Amer  Date Value Ref Range Status  07/10/2019 83 >59 mL/min/1.73 Final   EGFR (Non-African Amer.)  Date Value Ref Range Status  03/27/2013 57 (L)  Final    Comment:    eGFR values <68mL/min/1.73 m2 may be an indication of chronic kidney disease (CKD). Calculated eGFR is useful in patients with stable renal function. The eGFR calculation will not be reliable in acutely ill patients when serum creatinine is changing rapidly. It is not useful in  patients on dialysis. The eGFR calculation may not be applicable to patients at the low and high extremes of body sizes, pregnant women, and vegetarians.    GFR calc non Af Amer  Date Value Ref Range Status  07/10/2019 71 >59 mL/min/1.73 Final   eGFR  Date Value Ref Range Status  04/08/2021 56 (L) >59 mL/min/1.73 Final         Passed - Patient is not pregnant      Passed -  Last BP in normal range    BP Readings from Last 1 Encounters:  10/07/21 122/61         Passed - Valid encounter within last 6 months    Recent Outpatient Visits          1 month ago Type 2 diabetes mellitus without complication, without long-term current use of insulin [facility])   [facility] Jerrol Banana., MD   7 months ago Type 2 diabetes mellitus without complication, without long-term current use of insulin ([place])   [facility] Mikey Kirschner, PA-C   1 year ago Encounter for annual wellness visit (AWV) in Medicare patient   [facility] Rosanna Randy, Retia Passe., MD   1 year ago DM (diabetes mellitus) with complications [facility])   [facility] Jerrol Banana., MD   2 years ago DM (diabetes mellitus) with complications [facility])   [facility] Jerrol Banana., MD

## 2021-12-16 ENCOUNTER — Other Ambulatory Visit: Payer: Self-pay | Admitting: Family Medicine

## 2021-12-16 DIAGNOSIS — I10 Essential (primary) hypertension: Secondary | ICD-10-CM

## 2021-12-23 DIAGNOSIS — I25810 Atherosclerosis of coronary artery bypass graft(s) without angina pectoris: Secondary | ICD-10-CM | POA: Diagnosis not present

## 2021-12-23 DIAGNOSIS — I10 Essential (primary) hypertension: Secondary | ICD-10-CM | POA: Diagnosis not present

## 2021-12-23 DIAGNOSIS — I2510 Atherosclerotic heart disease of native coronary artery without angina pectoris: Secondary | ICD-10-CM | POA: Diagnosis not present

## 2021-12-23 DIAGNOSIS — I493 Ventricular premature depolarization: Secondary | ICD-10-CM | POA: Diagnosis not present

## 2022-01-26 ENCOUNTER — Telehealth: Payer: Self-pay | Admitting: Family Medicine

## 2022-01-26 NOTE — Telephone Encounter (Signed)
Please advise 

## 2022-01-26 NOTE — Telephone Encounter (Signed)
Todd Olson wants to speak directly with Dr. Rosanna Randy please!   It has to do with the change.

## 2022-02-23 ENCOUNTER — Ambulatory Visit: Payer: Medicare Other | Admitting: Family Medicine

## 2022-06-06 ENCOUNTER — Telehealth: Payer: Self-pay | Admitting: Family Medicine

## 2022-06-06 MED ORDER — LISINOPRIL-HYDROCHLOROTHIAZIDE 20-12.5 MG PO TABS: 1.0000 | ORAL_TABLET | Freq: Every day | ORAL | 0 refills | Status: AC

## 2022-06-06 NOTE — Telephone Encounter (Signed)
[facility] faxed refill request for the following medications:  lisinopril-hydrochlorothiazide (ZESTORETIC) 20-12.5 MG tablet   Please advise.

## 2022-06-06 NOTE — Telephone Encounter (Signed)
LOV 10/07/21 (advised to f/u in 4 months) NOV not scheduled LRF 11/29/21 #90 1RF

## 2022-06-08 DIAGNOSIS — I10 Essential (primary) hypertension: Secondary | ICD-10-CM | POA: Diagnosis not present

## 2022-06-08 DIAGNOSIS — Z23 Encounter for immunization: Secondary | ICD-10-CM | POA: Diagnosis not present

## 2022-06-08 DIAGNOSIS — I25810 Atherosclerosis of coronary artery bypass graft(s) without angina pectoris: Secondary | ICD-10-CM | POA: Diagnosis not present

## 2022-06-08 DIAGNOSIS — E785 Hyperlipidemia, unspecified: Secondary | ICD-10-CM | POA: Diagnosis not present

## 2022-06-08 DIAGNOSIS — E119 Type 2 diabetes mellitus without complications: Secondary | ICD-10-CM | POA: Diagnosis not present

## 2022-07-01 ENCOUNTER — Other Ambulatory Visit: Payer: Self-pay | Admitting: Family Medicine

## 2022-07-01 ENCOUNTER — Telehealth: Payer: Self-pay | Admitting: Family Medicine

## 2022-07-01 NOTE — Telephone Encounter (Signed)
[place] requesting refill Clopidogrel Bisulfate 75 MG  Please advise

## 2022-07-01 NOTE — Telephone Encounter (Signed)
This is a established patient at [facility] in [place].

## 2022-07-25 DIAGNOSIS — E785 Hyperlipidemia, unspecified: Secondary | ICD-10-CM | POA: Diagnosis not present

## 2022-07-25 DIAGNOSIS — I25810 Atherosclerosis of coronary artery bypass graft(s) without angina pectoris: Secondary | ICD-10-CM | POA: Diagnosis not present

## 2022-07-25 DIAGNOSIS — I2119 ST elevation (STEMI) myocardial infarction involving other coronary artery of inferior wall: Secondary | ICD-10-CM | POA: Diagnosis not present

## 2022-07-25 DIAGNOSIS — E7800 Pure hypercholesterolemia, unspecified: Secondary | ICD-10-CM | POA: Diagnosis not present

## 2022-07-25 DIAGNOSIS — I10 Essential (primary) hypertension: Secondary | ICD-10-CM | POA: Diagnosis not present

## 2022-07-25 DIAGNOSIS — E119 Type 2 diabetes mellitus without complications: Secondary | ICD-10-CM | POA: Diagnosis not present

## 2022-07-25 DIAGNOSIS — I493 Ventricular premature depolarization: Secondary | ICD-10-CM | POA: Diagnosis not present

## 2022-07-25 DIAGNOSIS — I2510 Atherosclerotic heart disease of native coronary artery without angina pectoris: Secondary | ICD-10-CM | POA: Diagnosis not present

## 2022-08-01 DIAGNOSIS — Z03818 Encounter for observation for suspected exposure to other biological agents ruled out: Secondary | ICD-10-CM | POA: Diagnosis not present

## 2022-08-01 DIAGNOSIS — U071 COVID-19: Secondary | ICD-10-CM | POA: Diagnosis not present

## 2022-08-08 DIAGNOSIS — H0014 Chalazion left upper eyelid: Secondary | ICD-10-CM | POA: Diagnosis not present

## 2022-08-08 DIAGNOSIS — H35373 Puckering of macula, bilateral: Secondary | ICD-10-CM | POA: Diagnosis not present

## 2022-08-08 DIAGNOSIS — E119 Type 2 diabetes mellitus without complications: Secondary | ICD-10-CM | POA: Diagnosis not present

## 2022-08-08 DIAGNOSIS — H02055 Trichiasis without entropian left lower eyelid: Secondary | ICD-10-CM | POA: Diagnosis not present

## 2022-09-07 DIAGNOSIS — I10 Essential (primary) hypertension: Secondary | ICD-10-CM | POA: Diagnosis not present

## 2022-09-07 DIAGNOSIS — I25810 Atherosclerosis of coronary artery bypass graft(s) without angina pectoris: Secondary | ICD-10-CM | POA: Diagnosis not present

## 2022-09-07 DIAGNOSIS — M7062 Trochanteric bursitis, left hip: Secondary | ICD-10-CM | POA: Diagnosis not present

## 2022-09-07 DIAGNOSIS — E119 Type 2 diabetes mellitus without complications: Secondary | ICD-10-CM | POA: Diagnosis not present

## 2022-09-07 DIAGNOSIS — E785 Hyperlipidemia, unspecified: Secondary | ICD-10-CM | POA: Diagnosis not present

## 2022-09-07 DIAGNOSIS — M7061 Trochanteric bursitis, right hip: Secondary | ICD-10-CM | POA: Diagnosis not present

## 2022-11-08 DIAGNOSIS — M7542 Impingement syndrome of left shoulder: Secondary | ICD-10-CM | POA: Diagnosis not present

## 2022-11-11 DIAGNOSIS — I25810 Atherosclerosis of coronary artery bypass graft(s) without angina pectoris: Secondary | ICD-10-CM | POA: Diagnosis not present

## 2022-11-11 DIAGNOSIS — E785 Hyperlipidemia, unspecified: Secondary | ICD-10-CM | POA: Diagnosis not present

## 2022-11-11 DIAGNOSIS — I10 Essential (primary) hypertension: Secondary | ICD-10-CM | POA: Diagnosis not present

## 2022-11-11 DIAGNOSIS — E118 Type 2 diabetes mellitus with unspecified complications: Secondary | ICD-10-CM | POA: Diagnosis not present

## 2022-11-11 DIAGNOSIS — Z0000 Encounter for general adult medical examination without abnormal findings: Secondary | ICD-10-CM | POA: Diagnosis not present

## 2023-01-24 DIAGNOSIS — E118 Type 2 diabetes mellitus with unspecified complications: Secondary | ICD-10-CM | POA: Diagnosis not present

## 2023-01-24 DIAGNOSIS — I25810 Atherosclerosis of coronary artery bypass graft(s) without angina pectoris: Secondary | ICD-10-CM | POA: Diagnosis not present

## 2023-01-24 DIAGNOSIS — I10 Essential (primary) hypertension: Secondary | ICD-10-CM | POA: Diagnosis not present

## 2023-01-24 DIAGNOSIS — E7800 Pure hypercholesterolemia, unspecified: Secondary | ICD-10-CM | POA: Diagnosis not present

## 2023-02-01 DIAGNOSIS — I2510 Atherosclerotic heart disease of native coronary artery without angina pectoris: Secondary | ICD-10-CM | POA: Diagnosis not present

## 2023-02-01 DIAGNOSIS — I25810 Atherosclerosis of coronary artery bypass graft(s) without angina pectoris: Secondary | ICD-10-CM | POA: Diagnosis not present

## 2023-02-01 DIAGNOSIS — I10 Essential (primary) hypertension: Secondary | ICD-10-CM | POA: Diagnosis not present

## 2023-02-01 DIAGNOSIS — E7800 Pure hypercholesterolemia, unspecified: Secondary | ICD-10-CM | POA: Diagnosis not present

## 2023-02-01 DIAGNOSIS — E119 Type 2 diabetes mellitus without complications: Secondary | ICD-10-CM | POA: Diagnosis not present

## 2023-02-01 DIAGNOSIS — I2119 ST elevation (STEMI) myocardial infarction involving other coronary artery of inferior wall: Secondary | ICD-10-CM | POA: Diagnosis not present

## 2023-02-01 DIAGNOSIS — I493 Ventricular premature depolarization: Secondary | ICD-10-CM | POA: Diagnosis not present

## 2023-05-04 DIAGNOSIS — E7800 Pure hypercholesterolemia, unspecified: Secondary | ICD-10-CM | POA: Diagnosis not present

## 2023-05-04 DIAGNOSIS — E118 Type 2 diabetes mellitus with unspecified complications: Secondary | ICD-10-CM | POA: Diagnosis not present

## 2023-05-04 DIAGNOSIS — I25810 Atherosclerosis of coronary artery bypass graft(s) without angina pectoris: Secondary | ICD-10-CM | POA: Diagnosis not present

## 2023-05-04 DIAGNOSIS — I10 Essential (primary) hypertension: Secondary | ICD-10-CM | POA: Diagnosis not present

## 2023-05-04 DIAGNOSIS — I499 Cardiac arrhythmia, unspecified: Secondary | ICD-10-CM | POA: Diagnosis not present

## 2023-08-08 DIAGNOSIS — I10 Essential (primary) hypertension: Secondary | ICD-10-CM | POA: Diagnosis not present

## 2023-08-08 DIAGNOSIS — I493 Ventricular premature depolarization: Secondary | ICD-10-CM | POA: Diagnosis not present

## 2023-08-08 DIAGNOSIS — I25810 Atherosclerosis of coronary artery bypass graft(s) without angina pectoris: Secondary | ICD-10-CM | POA: Diagnosis not present

## 2023-08-08 DIAGNOSIS — I2510 Atherosclerotic heart disease of native coronary artery without angina pectoris: Secondary | ICD-10-CM | POA: Diagnosis not present

## 2023-08-08 DIAGNOSIS — E7800 Pure hypercholesterolemia, unspecified: Secondary | ICD-10-CM | POA: Diagnosis not present

## 2023-08-08 DIAGNOSIS — I2119 ST elevation (STEMI) myocardial infarction involving other coronary artery of inferior wall: Secondary | ICD-10-CM | POA: Diagnosis not present

## 2023-08-10 DIAGNOSIS — E119 Type 2 diabetes mellitus without complications: Secondary | ICD-10-CM | POA: Diagnosis not present

## 2023-08-10 DIAGNOSIS — H43813 Vitreous degeneration, bilateral: Secondary | ICD-10-CM | POA: Diagnosis not present

## 2023-08-10 DIAGNOSIS — H35373 Puckering of macula, bilateral: Secondary | ICD-10-CM | POA: Diagnosis not present

## 2023-09-15 DIAGNOSIS — I10 Essential (primary) hypertension: Secondary | ICD-10-CM | POA: Diagnosis not present

## 2023-09-15 DIAGNOSIS — E118 Type 2 diabetes mellitus with unspecified complications: Secondary | ICD-10-CM | POA: Diagnosis not present

## 2023-09-15 DIAGNOSIS — E7800 Pure hypercholesterolemia, unspecified: Secondary | ICD-10-CM | POA: Diagnosis not present

## 2023-09-15 DIAGNOSIS — I25810 Atherosclerosis of coronary artery bypass graft(s) without angina pectoris: Secondary | ICD-10-CM | POA: Diagnosis not present

## 2023-09-15 DIAGNOSIS — N1831 Chronic kidney disease, stage 3a: Secondary | ICD-10-CM | POA: Diagnosis not present

## 2023-09-15 DIAGNOSIS — L570 Actinic keratosis: Secondary | ICD-10-CM | POA: Diagnosis not present

## 2023-12-07 DIAGNOSIS — I25810 Atherosclerosis of coronary artery bypass graft(s) without angina pectoris: Secondary | ICD-10-CM | POA: Diagnosis not present

## 2023-12-07 DIAGNOSIS — E119 Type 2 diabetes mellitus without complications: Secondary | ICD-10-CM | POA: Diagnosis not present

## 2023-12-07 DIAGNOSIS — I219 Acute myocardial infarction, unspecified: Secondary | ICD-10-CM | POA: Diagnosis not present

## 2023-12-07 DIAGNOSIS — E7800 Pure hypercholesterolemia, unspecified: Secondary | ICD-10-CM | POA: Diagnosis not present

## 2023-12-07 DIAGNOSIS — I493 Ventricular premature depolarization: Secondary | ICD-10-CM | POA: Diagnosis not present

## 2023-12-07 DIAGNOSIS — I2510 Atherosclerotic heart disease of native coronary artery without angina pectoris: Secondary | ICD-10-CM | POA: Diagnosis not present

## 2023-12-07 DIAGNOSIS — I10 Essential (primary) hypertension: Secondary | ICD-10-CM | POA: Diagnosis not present

## 2023-12-07 DIAGNOSIS — I2119 ST elevation (STEMI) myocardial infarction involving other coronary artery of inferior wall: Secondary | ICD-10-CM | POA: Diagnosis not present

## 2023-12-20 DIAGNOSIS — E118 Type 2 diabetes mellitus with unspecified complications: Secondary | ICD-10-CM | POA: Diagnosis not present

## 2023-12-20 DIAGNOSIS — Z1331 Encounter for screening for depression: Secondary | ICD-10-CM | POA: Diagnosis not present

## 2023-12-20 DIAGNOSIS — E7800 Pure hypercholesterolemia, unspecified: Secondary | ICD-10-CM | POA: Diagnosis not present

## 2023-12-20 DIAGNOSIS — N1831 Chronic kidney disease, stage 3a: Secondary | ICD-10-CM | POA: Diagnosis not present

## 2023-12-20 DIAGNOSIS — Z0000 Encounter for general adult medical examination without abnormal findings: Secondary | ICD-10-CM | POA: Diagnosis not present

## 2023-12-20 DIAGNOSIS — I2510 Atherosclerotic heart disease of native coronary artery without angina pectoris: Secondary | ICD-10-CM | POA: Diagnosis not present

## 2023-12-20 DIAGNOSIS — I10 Essential (primary) hypertension: Secondary | ICD-10-CM | POA: Diagnosis not present

## 2023-12-20 DIAGNOSIS — E785 Hyperlipidemia, unspecified: Secondary | ICD-10-CM | POA: Diagnosis not present

## 2024-02-12 DIAGNOSIS — D2261 Melanocytic nevi of right upper limb, including shoulder: Secondary | ICD-10-CM | POA: Diagnosis not present

## 2024-02-12 DIAGNOSIS — D2272 Melanocytic nevi of left lower limb, including hip: Secondary | ICD-10-CM | POA: Diagnosis not present

## 2024-02-12 DIAGNOSIS — L570 Actinic keratosis: Secondary | ICD-10-CM | POA: Diagnosis not present

## 2024-02-12 DIAGNOSIS — D2262 Melanocytic nevi of left upper limb, including shoulder: Secondary | ICD-10-CM | POA: Diagnosis not present

## 2024-02-12 DIAGNOSIS — D225 Melanocytic nevi of trunk: Secondary | ICD-10-CM | POA: Diagnosis not present

## 2024-02-12 DIAGNOSIS — D485 Neoplasm of uncertain behavior of skin: Secondary | ICD-10-CM | POA: Diagnosis not present

## 2024-02-12 DIAGNOSIS — D2271 Melanocytic nevi of right lower limb, including hip: Secondary | ICD-10-CM | POA: Diagnosis not present

## 2024-02-12 DIAGNOSIS — L821 Other seborrheic keratosis: Secondary | ICD-10-CM | POA: Diagnosis not present

## 2024-03-11 DIAGNOSIS — J40 Bronchitis, not specified as acute or chronic: Secondary | ICD-10-CM | POA: Diagnosis not present

## 2024-03-11 DIAGNOSIS — E1165 Type 2 diabetes mellitus with hyperglycemia: Secondary | ICD-10-CM | POA: Diagnosis not present

## 2024-03-11 DIAGNOSIS — J069 Acute upper respiratory infection, unspecified: Secondary | ICD-10-CM | POA: Diagnosis not present

## 2024-04-03 DIAGNOSIS — I10 Essential (primary) hypertension: Secondary | ICD-10-CM | POA: Diagnosis not present

## 2024-04-03 DIAGNOSIS — I2510 Atherosclerotic heart disease of native coronary artery without angina pectoris: Secondary | ICD-10-CM | POA: Diagnosis not present

## 2024-04-03 DIAGNOSIS — E1165 Type 2 diabetes mellitus with hyperglycemia: Secondary | ICD-10-CM | POA: Diagnosis not present

## 2024-04-03 DIAGNOSIS — E7800 Pure hypercholesterolemia, unspecified: Secondary | ICD-10-CM | POA: Diagnosis not present
